# Patient Record
Sex: Male | Born: 1965
Health system: Southern US, Community
[De-identification: ages and names within clinical notes are randomized; demographics above are authoritative.]

## PROBLEM LIST (undated history)

## (undated) DIAGNOSIS — E039 Hypothyroidism, unspecified: Secondary | ICD-10-CM

## (undated) DIAGNOSIS — I712 Thoracic aortic aneurysm, without rupture, unspecified: Secondary | ICD-10-CM

## (undated) DIAGNOSIS — I2699 Other pulmonary embolism without acute cor pulmonale: Secondary | ICD-10-CM

## (undated) DIAGNOSIS — E785 Hyperlipidemia, unspecified: Secondary | ICD-10-CM

## (undated) DIAGNOSIS — I10 Essential (primary) hypertension: Secondary | ICD-10-CM

## (undated) DIAGNOSIS — I776 Arteritis, unspecified: Secondary | ICD-10-CM

## (undated) DIAGNOSIS — IMO0002 Reserved for concepts with insufficient information to code with codable children: Secondary | ICD-10-CM

## (undated) DIAGNOSIS — R12 Heartburn: Secondary | ICD-10-CM

## (undated) DIAGNOSIS — R1312 Dysphagia, oropharyngeal phase: Secondary | ICD-10-CM

## (undated) DIAGNOSIS — R1013 Epigastric pain: Secondary | ICD-10-CM

## (undated) DIAGNOSIS — D6859 Other primary thrombophilia: Secondary | ICD-10-CM

## (undated) HISTORY — DX: Essential (primary) hypertension: I10

## (undated) HISTORY — DX: Hyperlipidemia, unspecified: E78.5

## (undated) HISTORY — DX: Heartburn: R12

## (undated) HISTORY — DX: Epigastric pain: R10.13

## (undated) HISTORY — DX: Arteritis, unspecified: I77.6

## (undated) HISTORY — DX: Reserved for concepts with insufficient information to code with codable children: IMO0002

## (undated) HISTORY — DX: Thoracic aortic aneurysm, without rupture, unspecified: I71.20

## (undated) HISTORY — DX: Dysphagia, oropharyngeal phase: R13.12

## (undated) HISTORY — DX: Thoracic aortic aneurysm, without rupture: I71.2

## (undated) HISTORY — DX: Other pulmonary embolism without acute cor pulmonale: I26.99

## (undated) HISTORY — DX: Other primary thrombophilia: D68.59

## (undated) HISTORY — DX: Hypothyroidism, unspecified: E03.9

---

## 1999-05-29 ENCOUNTER — Emergency Department (HOSPITAL_COMMUNITY): Admission: EM | Admit: 1999-05-29 | Discharge: 1999-05-29 | Payer: Self-pay | Admitting: Internal Medicine

## 1999-06-09 ENCOUNTER — Emergency Department (HOSPITAL_COMMUNITY): Admission: EM | Admit: 1999-06-09 | Discharge: 1999-06-09 | Payer: Self-pay | Admitting: Emergency Medicine

## 2007-10-09 ENCOUNTER — Observation Stay (HOSPITAL_COMMUNITY): Admission: EM | Admit: 2007-10-09 | Discharge: 2007-10-11 | Payer: Self-pay | Admitting: Emergency Medicine

## 2008-12-21 HISTORY — PX: ELBOW SURGERY: SHX618

## 2009-12-22 ENCOUNTER — Emergency Department (HOSPITAL_COMMUNITY): Admission: EM | Admit: 2009-12-22 | Discharge: 2009-12-22 | Payer: Self-pay | Admitting: Emergency Medicine

## 2009-12-23 ENCOUNTER — Encounter (INDEPENDENT_AMBULATORY_CARE_PROVIDER_SITE_OTHER): Payer: Self-pay | Admitting: *Deleted

## 2009-12-23 ENCOUNTER — Ambulatory Visit (HOSPITAL_COMMUNITY): Admission: RE | Admit: 2009-12-23 | Discharge: 2009-12-23 | Payer: Self-pay | Admitting: Interventional Cardiology

## 2009-12-28 ENCOUNTER — Emergency Department (HOSPITAL_COMMUNITY): Admission: EM | Admit: 2009-12-28 | Discharge: 2009-12-28 | Payer: Self-pay | Admitting: Emergency Medicine

## 2009-12-28 ENCOUNTER — Encounter (INDEPENDENT_AMBULATORY_CARE_PROVIDER_SITE_OTHER): Payer: Self-pay | Admitting: *Deleted

## 2009-12-31 ENCOUNTER — Emergency Department (HOSPITAL_COMMUNITY): Admission: EM | Admit: 2009-12-31 | Discharge: 2009-12-31 | Payer: Self-pay | Admitting: Emergency Medicine

## 2009-12-31 ENCOUNTER — Ambulatory Visit: Payer: Self-pay | Admitting: Gastroenterology

## 2009-12-31 DIAGNOSIS — I10 Essential (primary) hypertension: Secondary | ICD-10-CM | POA: Insufficient documentation

## 2009-12-31 DIAGNOSIS — IMO0002 Reserved for concepts with insufficient information to code with codable children: Secondary | ICD-10-CM

## 2009-12-31 DIAGNOSIS — R1310 Dysphagia, unspecified: Secondary | ICD-10-CM | POA: Insufficient documentation

## 2010-01-02 ENCOUNTER — Telehealth: Payer: Self-pay | Admitting: Gastroenterology

## 2010-01-02 ENCOUNTER — Ambulatory Visit: Payer: Self-pay | Admitting: Gastroenterology

## 2010-01-02 ENCOUNTER — Ambulatory Visit (HOSPITAL_COMMUNITY): Admission: RE | Admit: 2010-01-02 | Discharge: 2010-01-02 | Payer: Self-pay | Admitting: Gastroenterology

## 2010-01-03 ENCOUNTER — Telehealth: Payer: Self-pay | Admitting: Gastroenterology

## 2010-01-04 ENCOUNTER — Emergency Department (HOSPITAL_COMMUNITY): Admission: EM | Admit: 2010-01-04 | Discharge: 2010-01-04 | Payer: Self-pay | Admitting: Emergency Medicine

## 2010-01-06 ENCOUNTER — Encounter: Payer: Self-pay | Admitting: Gastroenterology

## 2010-01-06 ENCOUNTER — Ambulatory Visit: Payer: Self-pay | Admitting: Internal Medicine

## 2010-01-06 ENCOUNTER — Telehealth: Payer: Self-pay | Admitting: Gastroenterology

## 2010-01-06 DIAGNOSIS — E039 Hypothyroidism, unspecified: Secondary | ICD-10-CM

## 2010-01-06 DIAGNOSIS — R1312 Dysphagia, oropharyngeal phase: Secondary | ICD-10-CM | POA: Insufficient documentation

## 2010-01-06 DIAGNOSIS — I10 Essential (primary) hypertension: Secondary | ICD-10-CM | POA: Insufficient documentation

## 2010-01-06 DIAGNOSIS — R634 Abnormal weight loss: Secondary | ICD-10-CM

## 2010-01-06 DIAGNOSIS — E785 Hyperlipidemia, unspecified: Secondary | ICD-10-CM | POA: Insufficient documentation

## 2010-01-07 ENCOUNTER — Encounter: Payer: Self-pay | Admitting: Gastroenterology

## 2010-01-07 ENCOUNTER — Ambulatory Visit (HOSPITAL_COMMUNITY): Admission: RE | Admit: 2010-01-07 | Discharge: 2010-01-07 | Payer: Self-pay | Admitting: Internal Medicine

## 2010-01-08 ENCOUNTER — Telehealth: Payer: Self-pay | Admitting: Gastroenterology

## 2010-01-08 ENCOUNTER — Emergency Department (HOSPITAL_COMMUNITY): Admission: EM | Admit: 2010-01-08 | Discharge: 2010-01-08 | Payer: Self-pay | Admitting: Emergency Medicine

## 2010-01-09 ENCOUNTER — Encounter: Payer: Self-pay | Admitting: Nurse Practitioner

## 2010-01-14 ENCOUNTER — Ambulatory Visit: Payer: Self-pay | Admitting: Internal Medicine

## 2010-01-14 ENCOUNTER — Encounter: Payer: Self-pay | Admitting: Gastroenterology

## 2010-01-14 ENCOUNTER — Telehealth: Payer: Self-pay | Admitting: Gastroenterology

## 2010-01-14 DIAGNOSIS — R1013 Epigastric pain: Secondary | ICD-10-CM

## 2010-01-14 DIAGNOSIS — I712 Thoracic aortic aneurysm, without rupture, unspecified: Secondary | ICD-10-CM | POA: Insufficient documentation

## 2010-01-14 DIAGNOSIS — R1319 Other dysphagia: Secondary | ICD-10-CM

## 2010-01-14 DIAGNOSIS — R12 Heartburn: Secondary | ICD-10-CM

## 2010-01-15 ENCOUNTER — Telehealth: Payer: Self-pay | Admitting: Internal Medicine

## 2010-01-15 ENCOUNTER — Telehealth (INDEPENDENT_AMBULATORY_CARE_PROVIDER_SITE_OTHER): Payer: Self-pay | Admitting: *Deleted

## 2010-01-15 DIAGNOSIS — R930 Abnormal findings on diagnostic imaging of skull and head, not elsewhere classified: Secondary | ICD-10-CM | POA: Insufficient documentation

## 2010-01-16 ENCOUNTER — Telehealth: Payer: Self-pay | Admitting: Gastroenterology

## 2010-01-16 ENCOUNTER — Ambulatory Visit: Payer: Self-pay | Admitting: Internal Medicine

## 2010-01-16 ENCOUNTER — Inpatient Hospital Stay (HOSPITAL_COMMUNITY): Admission: AD | Admit: 2010-01-16 | Discharge: 2010-01-20 | Payer: Self-pay | Admitting: Internal Medicine

## 2010-01-20 ENCOUNTER — Encounter (INDEPENDENT_AMBULATORY_CARE_PROVIDER_SITE_OTHER): Payer: Self-pay | Admitting: *Deleted

## 2010-01-20 ENCOUNTER — Encounter (INDEPENDENT_AMBULATORY_CARE_PROVIDER_SITE_OTHER): Payer: Self-pay | Admitting: Internal Medicine

## 2010-01-20 ENCOUNTER — Ambulatory Visit: Payer: Self-pay | Admitting: Vascular Surgery

## 2010-01-29 ENCOUNTER — Ambulatory Visit: Payer: Self-pay | Admitting: Gastroenterology

## 2010-01-30 DIAGNOSIS — I776 Arteritis, unspecified: Secondary | ICD-10-CM

## 2010-02-03 ENCOUNTER — Encounter: Payer: Self-pay | Admitting: Gastroenterology

## 2010-02-07 ENCOUNTER — Encounter: Payer: Self-pay | Admitting: Gastroenterology

## 2010-02-26 ENCOUNTER — Emergency Department (HOSPITAL_COMMUNITY): Admission: EM | Admit: 2010-02-26 | Discharge: 2010-02-26 | Payer: Self-pay | Admitting: Emergency Medicine

## 2010-02-28 ENCOUNTER — Emergency Department (HOSPITAL_COMMUNITY): Admission: EM | Admit: 2010-02-28 | Discharge: 2010-02-28 | Payer: Self-pay | Admitting: Emergency Medicine

## 2010-03-04 ENCOUNTER — Inpatient Hospital Stay (HOSPITAL_COMMUNITY): Admission: EM | Admit: 2010-03-04 | Discharge: 2010-03-07 | Payer: Self-pay | Admitting: Emergency Medicine

## 2010-03-05 ENCOUNTER — Ambulatory Visit: Payer: Self-pay | Admitting: Vascular Surgery

## 2010-03-05 ENCOUNTER — Encounter (INDEPENDENT_AMBULATORY_CARE_PROVIDER_SITE_OTHER): Payer: Self-pay | Admitting: Internal Medicine

## 2010-03-06 ENCOUNTER — Ambulatory Visit: Payer: Self-pay | Admitting: Oncology

## 2010-03-17 ENCOUNTER — Encounter: Admission: RE | Admit: 2010-03-17 | Discharge: 2010-03-17 | Payer: Self-pay | Admitting: Family Medicine

## 2010-04-02 IMAGING — CT CT HEAD W/O CM
1 of 2 series · 16 of 30 positions shown, 20 images · non-contrast
Comparison: None.

CLINICAL DATA: Headache since lumbar puncture several days ago.

CT HEAD WITHOUT CONTRAST
TECHNIQUE: Contiguous axial images were obtained from the base of
the skull through the vertex without contrast.

[Series 2: brain · axial · 0.47mm/px · z∈[+92,+249]mm · 16 of 32 slices shown, 20 images]
[im 2/32  brain]
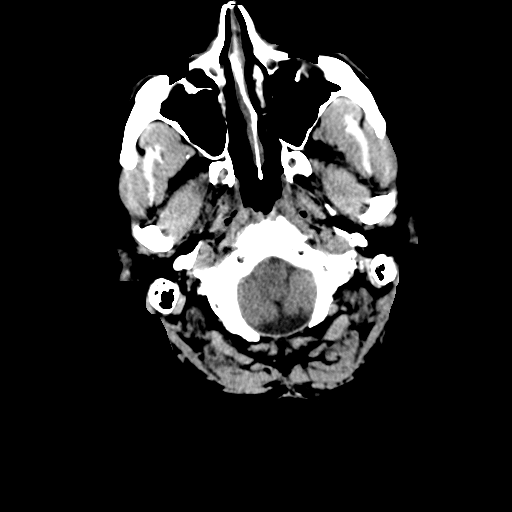
[im 2/32  bone]
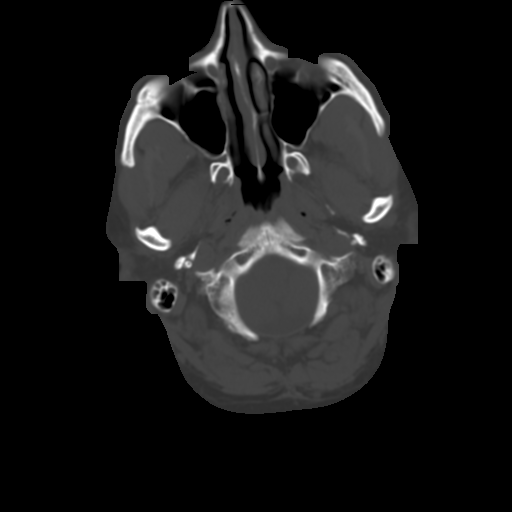
[im 4/32  brain]
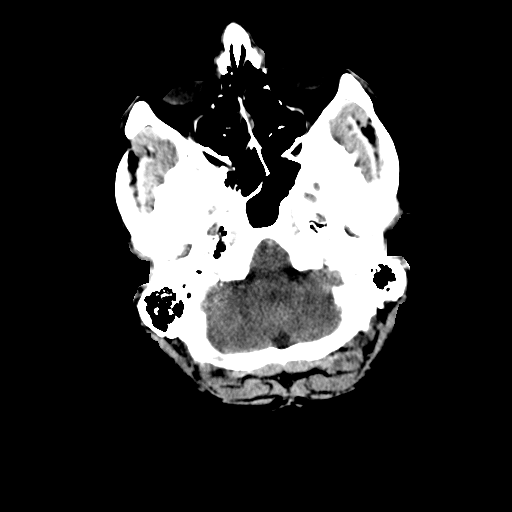
[im 5/32  brain]
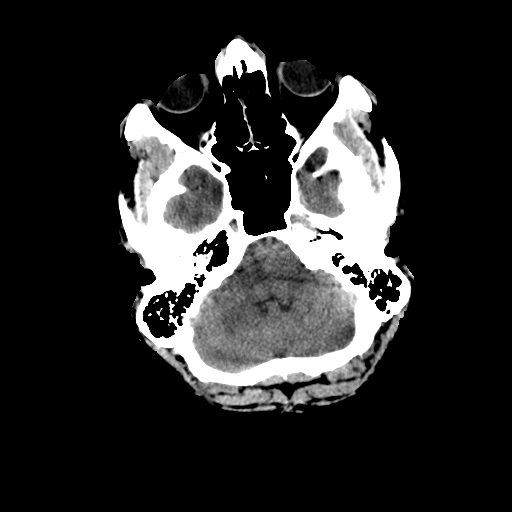
[im 7/32  brain]
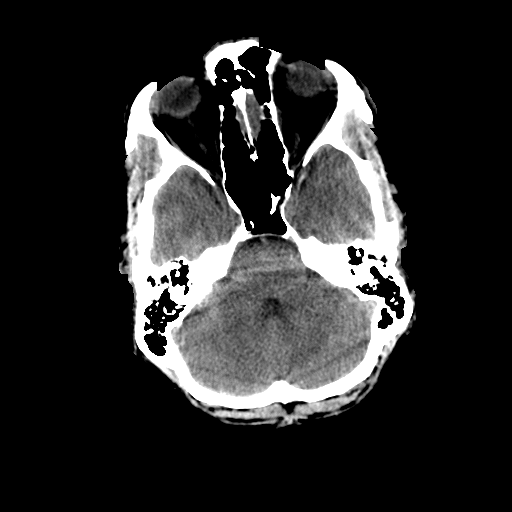
[im 10/32  brain]
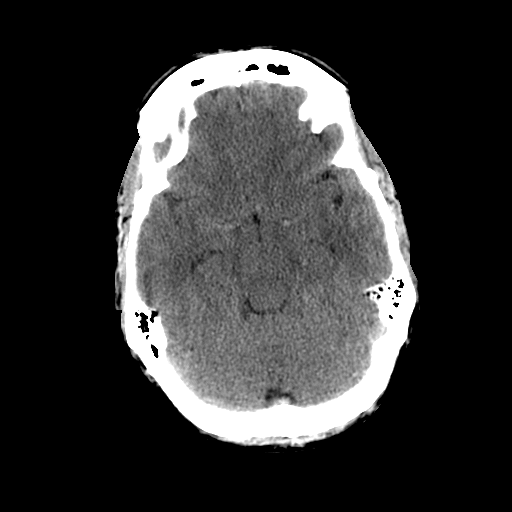
[im 10/32  bone]
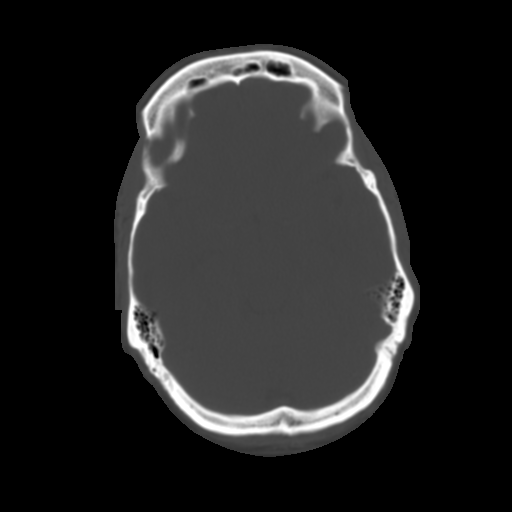
[im 12/32  brain]
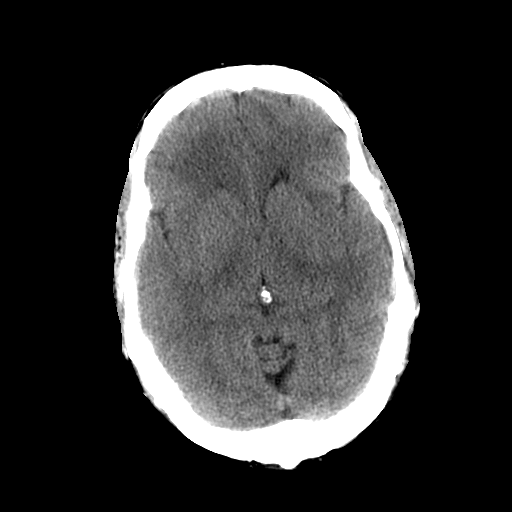
[im 14/32  brain]
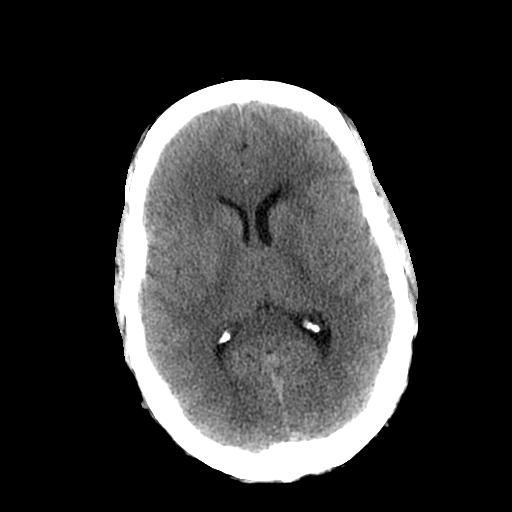
[im 15/32  brain]
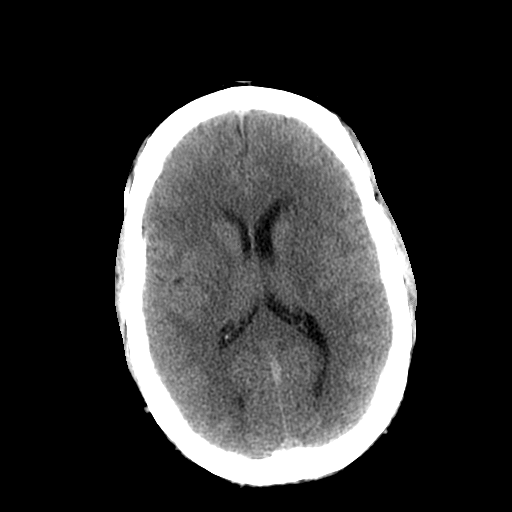
[im 17/32  brain]
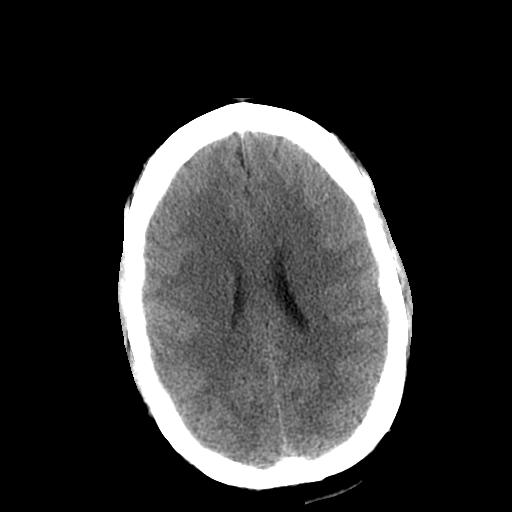
[im 17/32  bone]
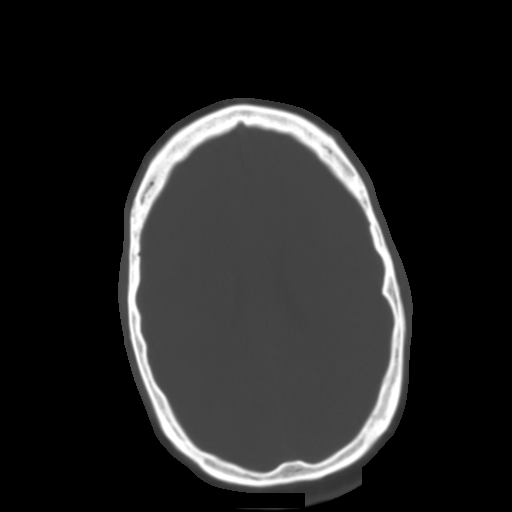
[im 18/32  brain]
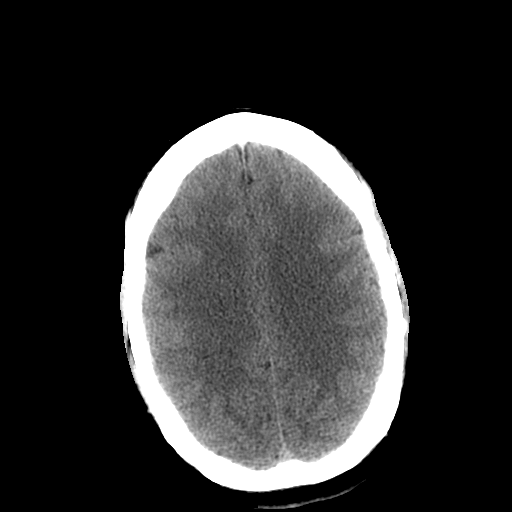
[im 20/32  brain]
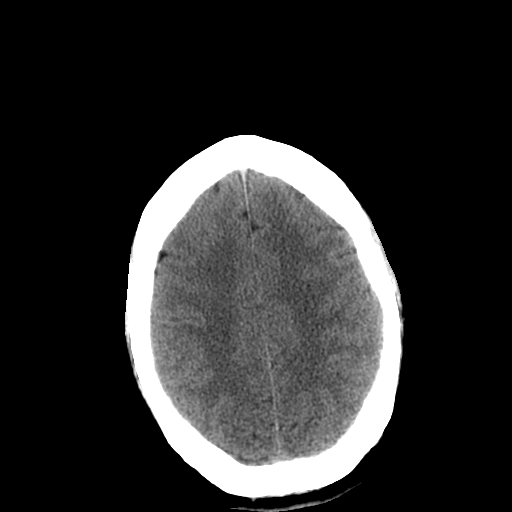
[im 22/32  brain]
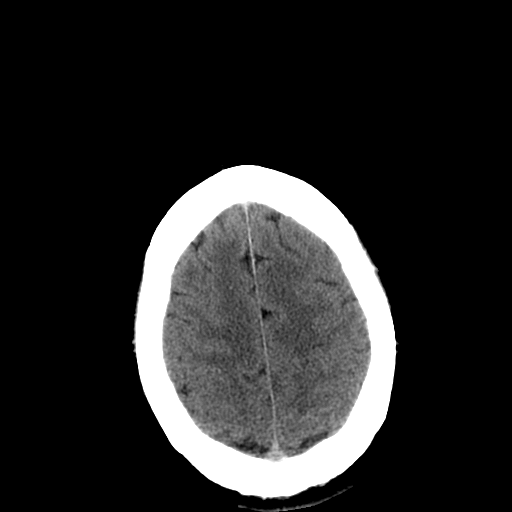
[im 25/32  brain]
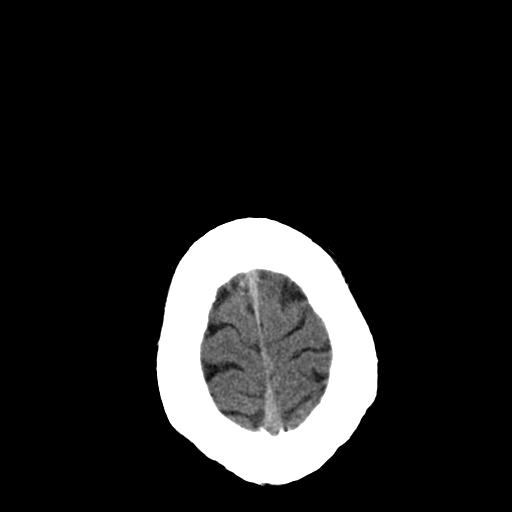
[im 25/32  bone]
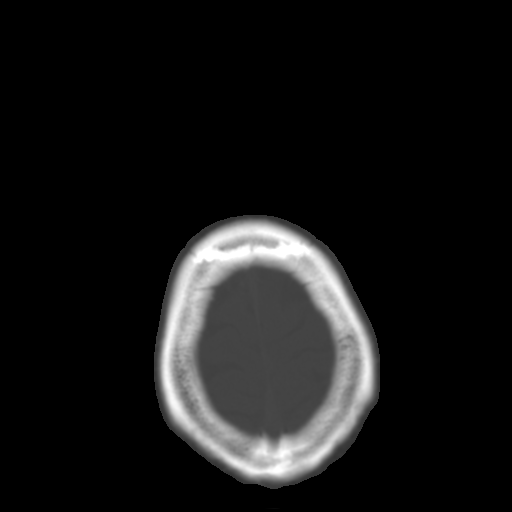
[im 27/32  brain]
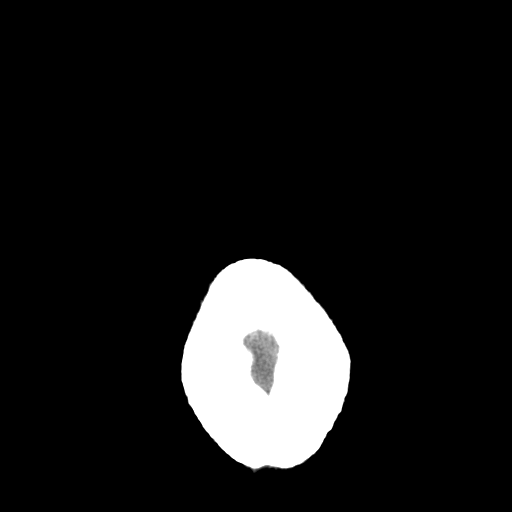
[im 28/32  brain]
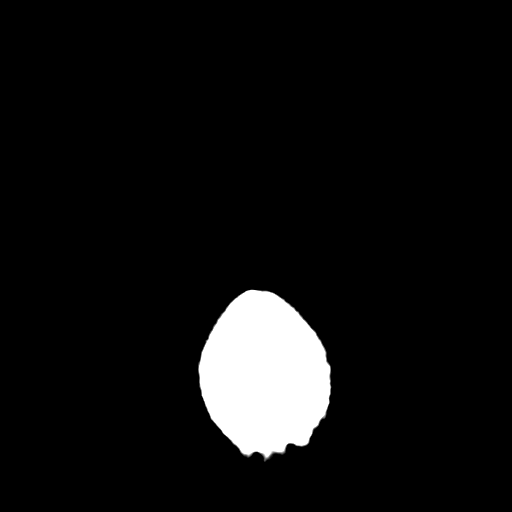
[im 30/32  brain]
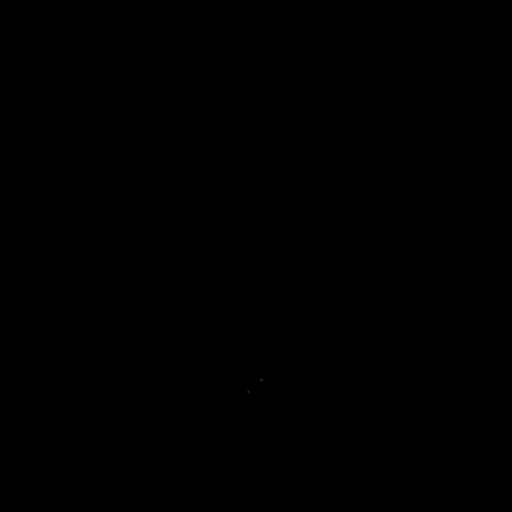

[16 of 30 positions shown; findings below may reference images not displayed]

FINDINGS: Ventricular size and CSF spaces normal.  No evidence for
acute infarct, hemorrhage, or mass lesion. No extra-axial fluid
collections or midline shift.  Calvarium intact.  No fluid in the
sinuses visualized.
IMPRESSION: No acute or focal abnormality.

## 2010-04-02 IMAGING — CT CT ANGIO CHEST
1 of 7 series · 12 of 36 positions shown · IV contrast (agent unspecified)
Comparison: Chest CT 01/16/2010

CLINICAL DATA: Chest pain.

CT ANGIOGRAPHY CHEST WITH CONTRAST
TECHNIQUE: Multidetector CT imaging of the chest was performed
using the standard protocol during bolus administration of
intravenous contrast.  Multiplanar CT image reconstructions
including MIPs were obtained to evaluate the vascular anatomy.
Contrast:  100 ml of Gmnipaque-XGG

[Series 3: pe · axial · 0.61mm/px · z∈[-294,-22]mm · 12 of 257 slices shown]
[im 20/257  lung]
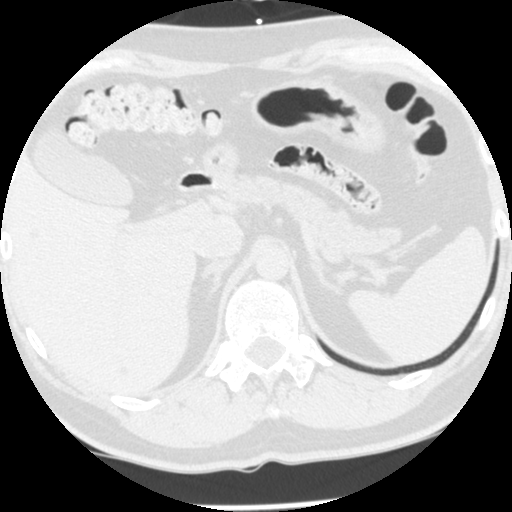
[im 40/257  mediastinal]
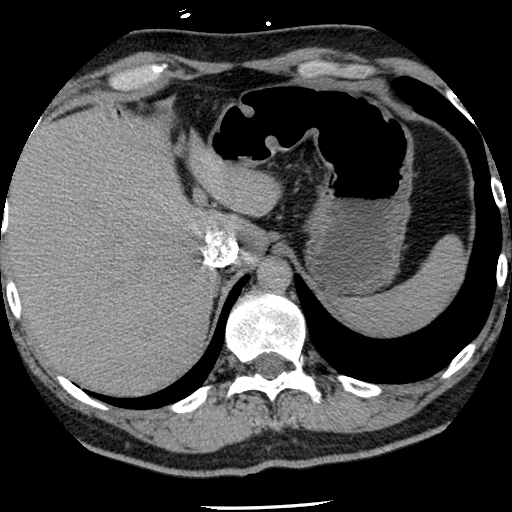
[im 60/257  lung]
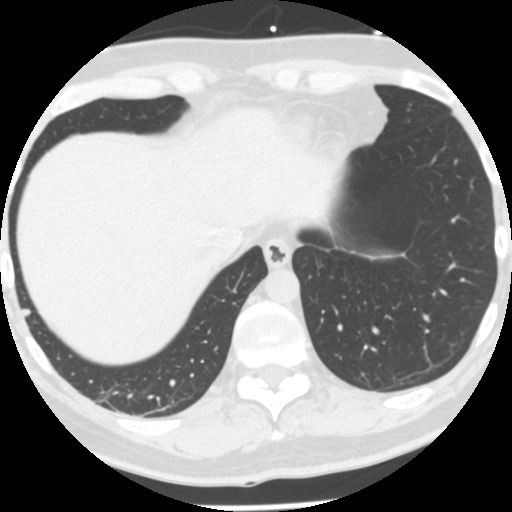
[im 79/257  mediastinal]
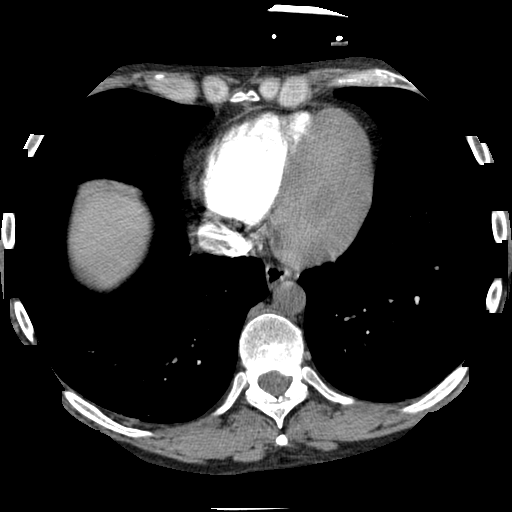
[im 99/257  lung]
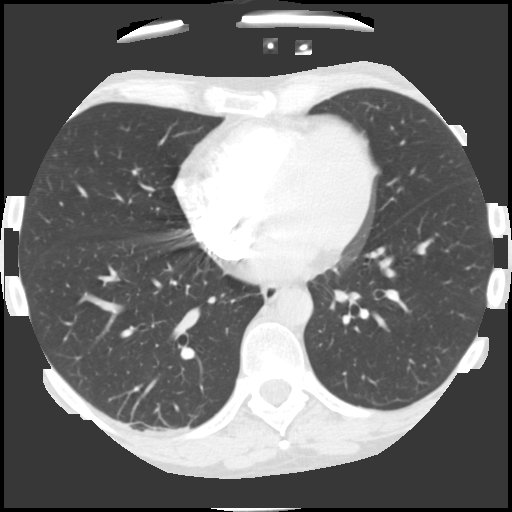
[im 119/257  mediastinal]
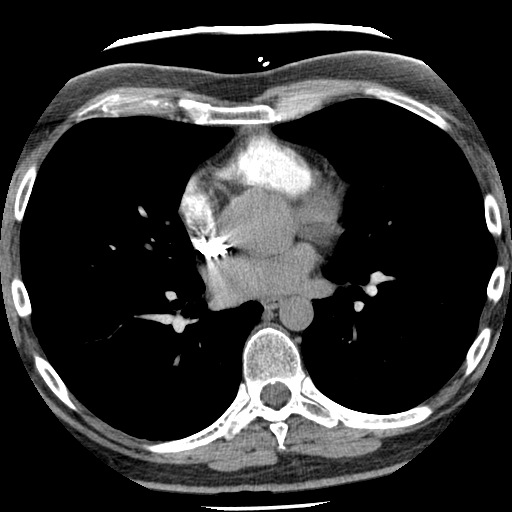
[im 138/257  lung]
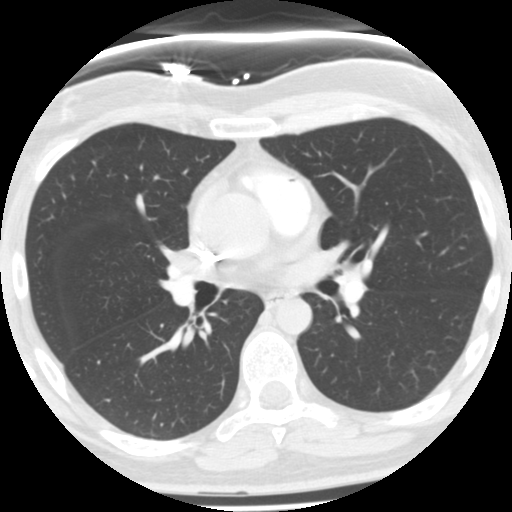
[im 158/257  mediastinal]
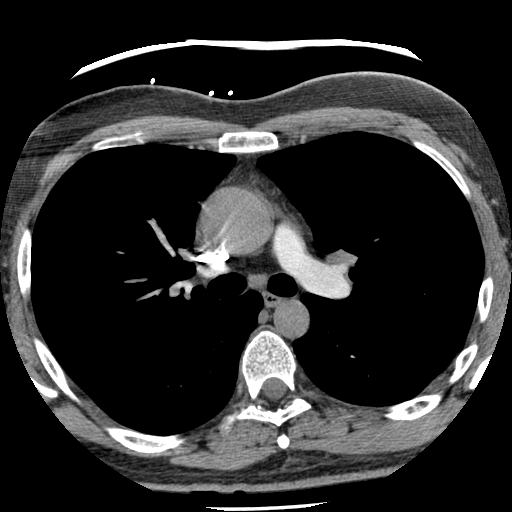
[im 178/257  lung]
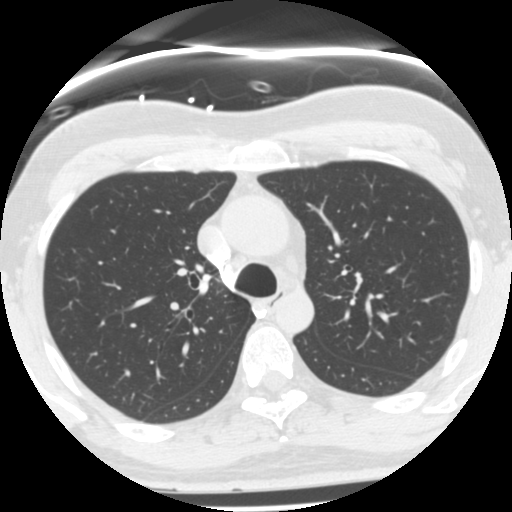
[im 197/257  mediastinal]
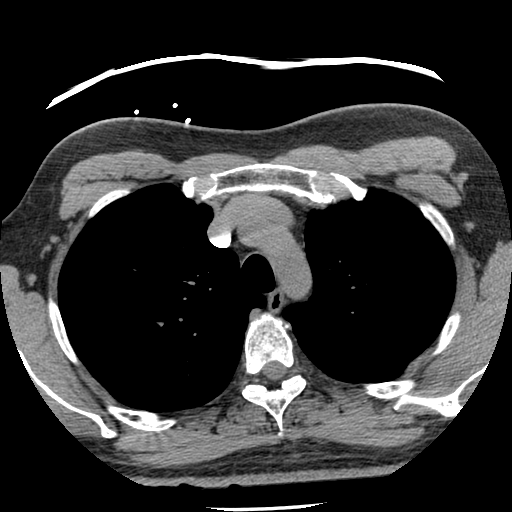
[im 217/257  lung]
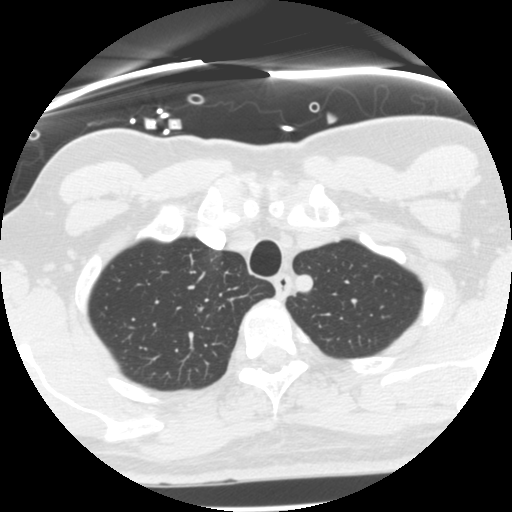
[im 237/257  mediastinal]
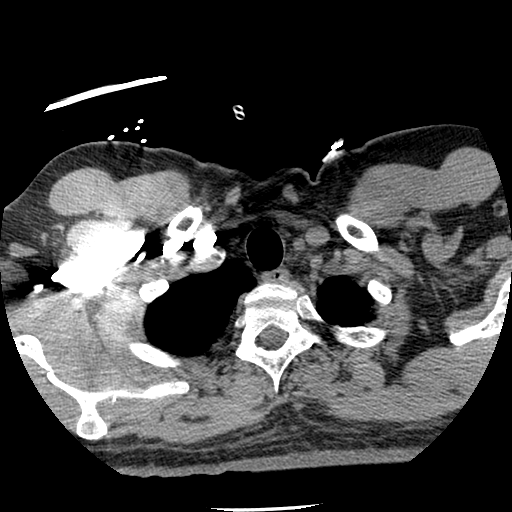

[12 of 36 positions shown; findings below may reference images not displayed]

FINDINGS: The chest wall is unremarkable and stable.  The bony
thorax is intact.

The heart is normal in size.  No pericardial effusion.  No
mediastinal or hilar adenopathy.  The esophagus is grossly normal.
Stable aneurysmal dilatation of the ascending aorta with maximal
measurements of 4.3 x 4.2 cm at the level of the right main
pulmonary artery.  The descending thoracic aorta is normal in
caliber.

The pulmonary arterial tree is fairly well opacified.  No definite
filling defects to suggest pulmonary emboli.

Examination of the lung parenchyma demonstrates no acute pulmonary
findings.  No pulmonary nodules or mass lesions.  No pleural
effusions.

The visualized upper abdomen is unremarkable.  Small hepatic cysts
are noted.

Review of the MIP images confirms the above findings.
IMPRESSION: 1.  No CT findings for pulmonary embolism.  The embolism noted on
the prior chest CT has resolved.
2.  Stable aneurysmal dilatation of the ascending aorta.
3.  No acute pulmonary findings.

## 2010-04-03 ENCOUNTER — Telehealth: Payer: Self-pay | Admitting: Gastroenterology

## 2010-04-07 ENCOUNTER — Ambulatory Visit: Payer: Self-pay | Admitting: Gastroenterology

## 2010-04-07 DIAGNOSIS — D68318 Other hemorrhagic disorder due to intrinsic circulating anticoagulants, antibodies, or inhibitors: Secondary | ICD-10-CM

## 2010-04-07 DIAGNOSIS — R079 Chest pain, unspecified: Secondary | ICD-10-CM | POA: Insufficient documentation

## 2010-04-07 DIAGNOSIS — D6859 Other primary thrombophilia: Secondary | ICD-10-CM

## 2010-04-08 ENCOUNTER — Ambulatory Visit: Payer: Self-pay | Admitting: Gastroenterology

## 2010-04-09 ENCOUNTER — Encounter (INDEPENDENT_AMBULATORY_CARE_PROVIDER_SITE_OTHER): Payer: Self-pay | Admitting: *Deleted

## 2010-04-09 ENCOUNTER — Telehealth: Payer: Self-pay | Admitting: Gastroenterology

## 2010-04-09 ENCOUNTER — Telehealth (INDEPENDENT_AMBULATORY_CARE_PROVIDER_SITE_OTHER): Payer: Self-pay | Admitting: *Deleted

## 2010-04-10 ENCOUNTER — Inpatient Hospital Stay (HOSPITAL_COMMUNITY): Admission: EM | Admit: 2010-04-10 | Discharge: 2010-04-11 | Payer: Self-pay | Admitting: Emergency Medicine

## 2010-04-10 ENCOUNTER — Ambulatory Visit (HOSPITAL_COMMUNITY): Admission: RE | Admit: 2010-04-10 | Discharge: 2010-04-10 | Payer: Self-pay | Admitting: Gastroenterology

## 2010-04-23 ENCOUNTER — Ambulatory Visit: Payer: Self-pay | Admitting: Gastroenterology

## 2010-04-29 ENCOUNTER — Telehealth: Payer: Self-pay | Admitting: Gastroenterology

## 2010-05-01 ENCOUNTER — Telehealth: Payer: Self-pay | Admitting: Gastroenterology

## 2010-05-02 ENCOUNTER — Ambulatory Visit: Payer: Self-pay | Admitting: Oncology

## 2010-05-02 ENCOUNTER — Telehealth: Payer: Self-pay | Admitting: Gastroenterology

## 2010-05-12 ENCOUNTER — Telehealth: Payer: Self-pay | Admitting: Gastroenterology

## 2010-05-22 ENCOUNTER — Encounter: Payer: Self-pay | Admitting: Gastroenterology

## 2010-05-27 ENCOUNTER — Ambulatory Visit (HOSPITAL_COMMUNITY): Admission: RE | Admit: 2010-05-27 | Discharge: 2010-05-27 | Payer: Self-pay | Admitting: Gastroenterology

## 2010-06-04 ENCOUNTER — Ambulatory Visit: Payer: Self-pay | Admitting: Gastroenterology

## 2010-06-16 DIAGNOSIS — I2699 Other pulmonary embolism without acute cor pulmonale: Secondary | ICD-10-CM

## 2010-08-29 ENCOUNTER — Ambulatory Visit: Payer: Self-pay | Admitting: Oncology

## 2010-09-03 ENCOUNTER — Encounter: Payer: Self-pay | Admitting: Family Medicine

## 2010-11-27 ENCOUNTER — Emergency Department (HOSPITAL_COMMUNITY): Admission: EM | Admit: 2010-11-27 | Discharge: 2010-10-07 | Payer: Self-pay | Admitting: Emergency Medicine

## 2011-01-11 ENCOUNTER — Encounter: Payer: Self-pay | Admitting: Neurology

## 2011-01-20 NOTE — Progress Notes (Signed)
Summary: triage  Phone Note Call from Patient Call back at [phone]   Caller: Dad-- Iantha Fallen Call For: Arlyce Dice Reason for Call: Talk to Nurse Summary of Call: Patient wants to speak to nurse states that his son is on his way to the ER because Dr Jenne Pane from ENT told him that it wasn't and ENT problem Initial call taken by: Tawni Levy,  January 08, 2010 1:46 PM  Follow-up for Phone Call        No answer/no voicemail. I will try again tomorrow. Follow-up by: Laureen Ochs LPN,  January 08, 2010 4:29 PM  Additional Follow-up for Phone Call Additional follow up Details #1::        Pt. went to the ER yesterday, no pill stuck, probably esophagitis. Pt. will keep appt. with his ENT today and also his upcomming Neurologist. Pt. instructed to call back as needed.  Additional Follow-up by: Laureen Ochs LPN,  January 09, 2010 8:38 AM

## 2011-01-20 NOTE — Progress Notes (Signed)
Summary: Triage  Phone Note Call from Patient Call back at Home Phone [phone]   Caller: Patient Call For: Dr. Arlyce Dice Reason for Call: Talk to Nurse Summary of Call: Pt doesnt feel he can wait until next available... chest pain and stomach pain from reflux. Initial call taken by: Vallarie Mare,  April 03, 2010 3:50 PM  Follow-up for Phone Call        Message left for patient to callback. Laureen Ochs LPN  April 03, 2010 3:57 PM   Pt. c/o stomache and chest burning. Takes Carafate QID and Zegerid two times a day. Wants an appt. ASAP. He will see Dr.Nohea Kras on 04-07-10 at 2:30pm. Pt. instructed to call back as needed.  Follow-up by: Laureen Ochs LPN,  April 04, 2010 9:20 AM

## 2011-01-20 NOTE — Progress Notes (Signed)
Summary: Record request and Disability Form  Request for records received from [facility]. Request forwarded to [employer]. Wilder Glade  January 15, 2010 12:38 PM

## 2011-01-20 NOTE — Assessment & Plan Note (Signed)
Summary: F/U FROM ENDO. AND BA ESOPHAGRAM             [name]   History of Present Illness Visit Type: Follow-up Visit Primary GI MD: Melvia Heaps MD [facility] Primary Provider: Duane Lope, MD  Requesting Provider: n/a Chief Complaint: F/u from ENDO, and BA Esophagram. Pt states that he is still having acid reflux and heartburn History of Present Illness:   [name] has returned for followup of his dysphagia and reflux.  Upper endoscopy was normal except for a suggestion of a dilated esophagus.  A barium swallow did not confirm this.  On DEXILANT he is feeling improved, however, he does have some breakthrough pyrosis.  He is taking DEXILANT twice daily in addition to Carafate.  Since his last visit he had a TIA.  Dysphagia has improved.   GI Review of Systems    Reports acid reflux and  heartburn.      Denies abdominal pain, belching, bloating, chest pain, dysphagia with liquids, dysphagia with solids, loss of appetite, nausea, vomiting, vomiting blood, weight loss, and  weight gain.        Denies anal fissure, black tarry stools, change in bowel habit, constipation, diarrhea, diverticulosis, fecal incontinence, heme positive stool, hemorrhoids, irritable bowel syndrome, jaundice, light color stool, liver problems, rectal bleeding, and  rectal pain.    Current Medications (verified): 1)  Aspir-Low 81 Mg Tbec (Aspirin) .... One Tablet By Mouth Once Daily 2)  Metoprolol Tartrate 1 Mg/ml Soln (Metoprolol Tartrate) .... Take 2.5 Ml By Mouth Once Daily 3)  Levothyroxine Sodium 112 Mcg Tabs (Levothyroxine Sodium) .Marland Kitchen.. 1 and 1/2 Pill Two Days A Week 4)  Lisinopril-Hydrochlorothiazide 20-12.5 Mg Tabs (Lisinopril-Hydrochlorothiazide) .... One Tablet By Mouth Once Daily 5)  Carafate 1 Gm/61ml Susp (Sucralfate) .... Take 1 Gram 4 Times Daily 6)  Coumadin 7.5 Mg Tabs (Warfarin Sodium) .... Take One Tab By Mouth As Directed 7)  Ativan 1 Mg Tabs (Lorazepam) .... One To Three Times A Day As Needed 8)   Tylenol Extra Strength 500 Mg Tabs (Acetaminophen) .... As Needed 9)  Zoloft 50 Mg Tabs (Sertraline Hcl) .... Take 1/2 Tab By Mouth Once Daily 10)  Dexilant 60 Mg Cpdr (Dexlansoprazole) .Marland Kitchen.. 1 Tab 1-2 Times  Daily 11)  Potassium Chloride 30 Meq/124ml Soln (Potassium Chloride) .... Once Daily  Allergies (verified): No Known Drug Allergies  Past History:  Past Medical History: Reviewed history from 01/29/2010 and no changes required. Hypertension Thyroid Disease  GERD Ascending Thoracic Aneurysm, 4.1 cm, on CTA chest 2008 Pulmonary Embolism  Past Surgical History: Reviewed history from 12/31/2009 and no changes required. Left Elbow Surgery   Family History: Reviewed history from 12/31/2009 and no changes required. No FH of Colon Cancer: Family History of Clotting disorder: Mother  Family History of Heart Disease: Father   Social History: Reviewed history from 12/31/2009 and no changes required. 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  Alcohol Use - no Daily Caffeine Use: 2 daily  Illicit Drug Use - no  Review of Systems  The patient denies allergy/sinus, anemia, anxiety-new, arthritis/joint pain, back pain, blood in urine, breast changes/lumps, change in vision, confusion, cough, coughing up blood, depression-new, fainting, fatigue, fever, headaches-new, hearing problems, heart murmur, heart rhythm changes, itching, menstrual pain, muscle pains/cramps, night sweats, nosebleeds, pregnancy symptoms, shortness of breath, skin rash, sleeping problems, sore throat, swelling of feet/legs, swollen lymph glands, thirst - excessive , urination - excessive , urination changes/pain, urine leakage, vision changes, and voice change.  Vital Signs:  Patient profile:   45 year old male Height:      75 inches Weight:      203 pounds BMI:     25.46 BSA:     2.21 Pulse rate:   88 / minute Pulse rhythm:   regular BP sitting:   112 / 76  (left arm) Cuff size:    regular  Vitals Entered By: Ok Anis CMA (Apr 23, 2010 9:21 AM)   Impression & Recommendations:  Problem # 1:  HEARTBURN (ICD-787.1) He seems to be doing fairly well on a DEXILANT.  I instructed him to use antacids p.r.n. for breakthrough pyrosis.  If this  is a frequent occurrence would consider switching to a different PPI.  He will discontinue  his Carafate.  Problem # 2:  DYSPHAGIA (ICD-787.29) Assessment: Improved  Problem # 3:  COAGULOPATHY, COUMADIN-INDUCED (ICD-286.5) Assessment: Comment Only  Patient Instructions: 1)  Copy sent to : Hessie Diener Ross,MD 2)  You will need to schedule a follow up appointment in 6 weeks 3)  The medication list was reviewed and reconciled.  All changed / newly prescribed medications were explained.  A complete medication list was provided to the patient / caregiver. 4)  Please schedule a follow-up appointment in 6 to 8 weeks.

## 2011-01-20 NOTE — Discharge Summary (Signed)
NAMEQUINTERIUS, [name]             ACCOUNT NO.:  [id number]      MEDICAL RECORD NO.:  [id number]          PATIENT TYPE:  INP      LOCATION:  1417                         FACILITY:  [facility]      PHYSICIAN:  Hillery Aldo, M.D.   DATE OF BIRTH:  Aug 14, 1966      DATE OF ADMISSION:  01/16/2010   DATE OF DISCHARGE:  01/20/2010                                  DISCHARGE SUMMARY      PRIMARY CARE PHYSICIAN:  Dr. Crawford Givens      NEUROLOGISTS:  Dr. Lewayne Bunting and Dr. Levert Feinstein.      DISCHARGE DIAGNOSES:   1. Incidentally discovered pulmonary embolism.   2. Chronic dysphagia.   3. Abnormal MRI showing foci of abnormal signal within the hemispheric       white matter which could represent small vessel disease, a       demyelinating disease, or multiple sclerosis.   4. Hypokalemia.   5. Ascending thoracic aortic aneurysm measuring 41 x 42 mm.   6. Hypertension.   7. Hypothyroidism.   8. Gastroesophageal reflux disease.   9. Anxiety.   10.Insomnia.   11.Malnutrition secondary to decreased p.o. intake from dysphagia.      DISCHARGE MEDICATIONS:   1. Enoxaparin 100 mg subcutaneously every 12 hours until told to stop       by PCP.   2. Ensure supplements b.i.d. and as needed.   3. Lorazepam 1 mg p.o. every 6 hours p.r.n. anxiety, sleep, or       dyspnea.   4. Metoprolol 25 mg p.o. daily.   5. Warfarin 12.5 mg p.o. daily.   6. Aspirin 81 mg p.o. daily.   7. Carafate 1 g/10 mL 2 teaspoons p.o. q.i.d.   8. Lisinopril/hydrochlorothiazide 20/12.5 mg p.o. daily.   9. Pepto-Bismol 1 teaspoon by mouth every 4 hours p.r.n.   10.Synthroid 112 mcg daily except for Sundays and Wednesdays when the       dose is increased to 168 mcg.   11.Zegerid 40 mg 1 packet p.o. daily.      CONSULTATIONS:   1. Dr. Levert Feinstein of Neurology.   2. Dr. Russella Dar of Gastroenterology.      BRIEF ADMISSION HPI:  Patient is a 45 year old male whose history of   present illness dates back to December when he began  to experience   problems with dysphagia.  Patient's dysphagia progressed to the point   where he had a choking episode while in a restaurant.  Subsequently   underwent an outpatient evaluation including an upper endoscopy where he   was found to have a mild stricture which was dilated.  GI   recommendations were to pursue a neurological workup.  The patient   subsequently saw Dr. Terrace Arabia as an outpatient where an extensive evaluation   was undertaken.  He underwent an MRI scan which showed white matter   changes.  Dr. Terrace Arabia felt that the changes were likely due to his   hypertension because the area of distribution was not consistent with   multiple  sclerosis.  It was not felt that his abnormal MRI scan was   related to his dysphagia.  The patient underwent visual evoked response   testing by Dr. Terrace Arabia which was apparently within normal limits.  The   patient has had a routine followup CT scan of his chest to follow up on   his ascending aortic aneurysm to see if this was the source of dysphagia   and subsequently was discovered to have an incidentally noted pulmonary   embolus.  He then was referred to the hospitalist service for initiation   of anticoagulation treatment.  For the full details, please see the   dictated report done by Dr. Jeoffrey Massed.      PROCEDURES AND DIAGNOSTIC STUDIES:   1. CT angiogram of the chest on January 16, 2010, showed no       significant change in the dilatation of the ascending aorta.       Pulmonary thromboembolism within the posterior segment of the right       lower lobe.  Minimal spot burden.   2. Two-dimensional echocardiogram done on January 20, 2010, showed       mild focal basal hypertrophy of the septum.  Systolic function was       normal.  The estimated ejection fraction was 55% to 60%.  No       diagnostic regional wall motion abnormality identified.      DISCHARGE LABORATORY VALUES:  ANA was negative.  Sodium was 142,   potassium 4, chloride  106, bicarbonate 33, BUN 9, creatinine 0.88,   glucose 97, calcium 9.1.  PT was 16.6 with an INR of 1.35.  BNP was less   than 30.  C-reactive protein was 0.  Rheumatoid factor was less than 20.   ESR was 5.  A hypercoagulable panel was negative.  Factor V Leiden is   still pending.  Hemoglobin A1c was 5.3%.  RPR was nonreactive.   Scleroderma and centromere antibodies are pending.      HOSPITAL COURSE BY PROBLEM:   1. Acute pulmonary embolism:  This pulmonary embolism was incidentally       discovered upon evaluation of his known thoracic ascending thoracic       aneurysm.  Patient was put on Lovenox and Coumadin.  His INR is not       yet therapeutic and he will need at least 5 days of overlap with an       additional 48 hours of overlap once his INR is therapeutic.       Patient has been instructed on how to self-administer Lovenox       injections.  His mother has factor V Leiden and she is familiar       with the process of anticoagulation.  Patient's factor V Leiden       studies are pending but his other hypercoagulability profile workup       was negative.   2. Dysphagia:  This has been chronic since mid December.  Patient has       undergone both extensive GI evaluation and also neurologic       evaluation with no specific etiology found.  Of note, with his left-       sided facial droop, abnormal MRI scans, and relatively mild       stricture, status post dilatation, it would seem that this likely       represents a neurologic problem.  Patient has only been  able to       take in liquids since December and has lost a fair amount of       weight.  It is imperative that the origin of his dysphagia is       discovered.  He was seen by the speech therapist here.  There are       no signs of aspiration.  A modified barium swallow was done back on       January 07, 2010.  At that time, he appeared to have difficulty       passing barium through the upper esophagus with questionable         dysmotility and/or discoordination of upper esophageal sphincter       opening.  A 13-mm barium tablet lodged in the piriform sinus and       required multiple boluses to eventually clear.  Appearance of       variable amounts of residual throughout esophagus with suspected       recurrent sensation to pharynx.  It was felt that the patient       likely had dysmotility with mild backflow.  At this point, the       patient continues to only be able to tolerate liquids.  He is       requesting workup at a tertiary care center.  The patient has been       referred to Dr. Lewayne Bunting at [facility] for ongoing evaluation.   3. Abnormal MRI:  Unclear etiology.  The patient is under the care of       Dr. Terrace Arabia and has been referred to Dr. Lilian Kapur for further       evaluation.   4. Hypokalemia:  Patient's potassium was fully repleted.   5. Ascending aneurysm:  Stable on chest CT.  A 2D echo also stable.   6. Hypertension:  Patient's blood pressure is well controlled on his       home medications.   7. Hypothyroidism:  Patient has been continued on his usual dose of       Synthroid.   8. Gastroesophageal reflux disease:  Patient was maintained on a       proton pump inhibitor, as well as Carafate.   9. Anxiety/insomnia:  Patient was given Ativan with good relief and       improvement in his insomnia and dyspnea symptoms.      DISPOSITION:  Patient is medically stable for discharge.  He will be   discharged home.  He has a followup PT/INR check at his primary care   physician's office on January 21, 2010, at 2:45 p.m.  He has a followup   appointment with Dr. Lewayne Bunting of Neurology at [facility].      Time spent coordinating care for discharge and in discharge instructions   equals 45 minutes.               Hillery Aldo, M.D.            CR/MEDQ  D:  01/20/2010  T:  01/20/2010  Job:  [id number]      cc:   Dwana Curd. Para March, M.D.   Fax: [phone]      Lewayne Bunting, M.D.   Fax:  [phone]      Levert Feinstein, MD         Electronically Signed by Hillery Aldo M.D. on 01/21/2010 11:01:22 AM

## 2011-01-20 NOTE — Letter (Signed)
Summary: Generic Letter  [facility]  [address] [place], [place] [address]   Phone: [phone]  Fax: [phone]    01/06/2010  [name] [address] [place], [place]  [address]  Dear Mr. Hanken,   Your biopsies demonstrated inflammatory changes only.    Please follow the recommendations previously discussed.  Should you have any immediate concerns or questions, feel free to contact me at the office.    Sincerely,  Barbette Hair. Arlyce Dice, M.D., [facility]            Sincerely,   Melvia Heaps MD  Appended Document: Generic Letter Letter mailed to patient.

## 2011-01-20 NOTE — Letter (Signed)
Summary: [facility]  [facility]   Imported By: Sherian Rein 01/20/2010 10:12:39  _____________________________________________________________________  External Attachment:    Type:   Image     Comment:   External Document

## 2011-01-20 NOTE — Procedures (Signed)
Summary: Upper Endoscopy w/DIL  Patient: [name] Note: All result statuses are Final unless otherwise noted.  Tests: (1) Upper Endoscopy w/DIL (UED)  UED Upper Endoscopy w/DIL                             DONE     [facility]     [address] [place], [place]  [address]           ENDOSCOPY PROCEDURE REPORT           PATIENT:  [name], [name]  MR#:  [id number]     BIRTHDATE:  March 24, 1966, 43 yrs. old  GENDER:  male           ENDOSCOPIST:  Barbette Hair. Arlyce Dice, MD     ASSISTANT:           PROCEDURE DATE:  01/02/2010     PROCEDURE:  EGD with biopsy, Elease Hashimoto Dilation of the Esophagus     ASA CLASS:  Class II     INDICATIONS:  1) dysphagia           MEDICATIONS:   Fentanyl 50 mcg IV, Versed 5 mg IV, glycopyrrolate     (Robinal) 0.2 mg IV     TOPICAL ANESTHETIC:  Cetacaine Spray           DESCRIPTION OF PROCEDURE:   After the risks benefits and     alternatives of the procedure were thoroughly explained, informed     consent was obtained.  The EG-[id number] ([id number]) endoscope was     introduced through the mouth and advanced to the third portion of     the duodenum, without limitations.  The instrument was slowly     withdrawn as the mucosa was carefully examined.     <<PROCEDUREIMAGES>>           A stricture was found at the gastroesophageal junction. Early     esophageal stricture. #52Fr maloney passed with minimal resistance     (see image005). No heme on dilator tip  Mild gastritis was found     in the antrum. With standard forceps, a biopsy was obtained and     sent to pathology (see image002).  Duodenitis was found in the     bulb of the duodenum (see image003). Few superficial erosions  The     esophagus and gastroesophageal junction were completely normal in     appearance (see image006, image007, image009, and image011).     Normal proximal, mid and distal esophagus, larynx    Dilation was     then performed at the           COMPLICATIONS:  None       ENDOSCOPIC IMPRESSION:     1) Early Stricture at the gastroesophageal junction - s/p     dilitation     2) Mild gastritis in the antrum     3) Duodenitis in the bulb of duodenum     4) Normal esophagus proximal to stricture     RECOMMENDATIONS:     1) Nexium 40 mg     2) call office to schedule an office visit for 1 month           REPEAT EXAM:  No           ______________________________     Barbette Hair. Arlyce Dice, MD  CC:  Crawford Givens, M.D.           n.     eSIGNED:   Barbette Hair. Shamecka Hocutt at 01/02/2010 08:14 AM           Doristine Locks, [id number]  Note: An exclamation mark (!) indicates a result that was not dispersed into the flowsheet. Document Creation Date: 01/02/2010 8:15 AM _______________________________________________________________________  (1) Order result status: Final Collection or observation date-time: 01/02/2010 08:05 Requested date-time:  Receipt date-time:  Reported date-time:  Referring Physician:   Ordering Physician: Melvia Heaps [phone]) Specimen Source:  Source: Launa Grill Order Number: [id number] Lab site:

## 2011-01-20 NOTE — Letter (Signed)
Summary:  [facility]  [facility]   Imported By: Lanelle Bal 09/16/2010 09:01:53  _____________________________________________________________________  External Attachment:    Type:   Image     Comment:   External Document

## 2011-01-20 NOTE — Assessment & Plan Note (Signed)
Summary: TROUBLE SWALLOWING--CH.   History of Present Illness Visit Type: new patient  Primary GI MD: Melvia Heaps MD [facility] Primary Provider: Crawford Givens, MD Requesting Provider: n/a Chief Complaint: Dysphagia, chest pain, belching, loss of appetite, and diarrhea  History of Present Illness:   [name] is a 45 year old white male referred at the request of Dr. Para March for evaluation of dysphagia.  Approximately 2 weeks ago he developed a sore throat for which he received amoxicillin.   Strep test was negative.  He was subsequently placed on a Z-Pak because of persistent symptoms.  He was also complaining of neck and shoulder and throat pain and pain upon swallowing.  He underwent an MRI of the head and neck that was negative.  He has been complaining of a  persistent sensation of something in his upper chest or lower neck upon swallowing.  An ENT examination apparantly was negative.  Symptoms occur with both liquids and solids.  He denies pyrosis, per se.  He was also evaluated because of a decreased right nasolabial fold suggestive of either a palsy or a stroke.  An MRI did not confirm any acute vascular event.   GI Review of Systems    Reports belching, chest pain, dysphagia with liquids, dysphagia with solids, and  loss of appetite.      Denies abdominal pain, acid reflux, bloating, heartburn, nausea, vomiting, vomiting blood, weight loss, and  weight gain.      Reports diarrhea.     Denies anal fissure, black tarry stools, change in bowel habit, constipation, diverticulosis, fecal incontinence, heme positive stool, hemorrhoids, irritable bowel syndrome, jaundice, light color stool, liver problems, rectal bleeding, and  rectal pain.    Current Medications (verified): 1)  Aspir-Low 81 Mg Tbec (Aspirin) .... One Tablet By Mouth Once Daily 2)  Metoprolol Tartrate 25 Mg Tabs (Metoprolol Tartrate) .... One Tablet By Mouth Once Daily 3)  Levothyroxine Sodium 112 Mcg Tabs (Levothyroxine  Sodium) .... 1/2 Pill Twice A Week and Then One Whole Tablet By Mouth For Five Days 4)  Lisinopril-Hydrochlorothiazide 20-12.5 Mg Tabs (Lisinopril-Hydrochlorothiazide) .... One Tablet By Mouth Once Daily  Allergies (verified): 1)  ! Codeine  Past History:  Past Medical History: Hypertension Thyroid Disease  GERD  Past Surgical History: Left Elbow Surgery   Family History: No FH of Colon Cancer: Family History of Clotting disorder: Mother  Family History of Heart Disease: Father   Social History: Occupation: Clinical research associate  Single Patient has never smoked.  Alcohol Use - no Daily Caffeine Use: 2 daily  Illicit Drug Use - no Smoking Status:  never Drug Use:  no  Review of Systems  The patient denies allergy/sinus, anemia, anxiety-new, arthritis/joint pain, back pain, blood in urine, breast changes/lumps, change in vision, confusion, cough, coughing up blood, depression-new, fainting, fatigue, fever, headaches-new, hearing problems, heart murmur, heart rhythm changes, itching, muscle pains/cramps, night sweats, nosebleeds, shortness of breath, skin rash, sleeping problems, sore throat, swelling of feet/legs, swollen lymph glands, thirst - excessive, urination - excessive, urination changes/pain, urine leakage, vision changes, and voice change.    Vital Signs:  Patient profile:   45 year old male Height:      75 inches Weight:      222 pounds BMI:     27.85 BSA:     2.30 Pulse rate:   76 / minute Pulse rhythm:   regular BP sitting:   132 / 80  (left arm) Cuff size:   regular  Vitals Entered By: Tresa Endo  Katrinka Blazing CMA (December 31, 2009 1:47 PM)  Physical Exam  Additional Exam:  On physical exam he is a well-developed well-nourished male  skin: anicteric HEENT: normocephalic; PEERLA; no nasal or pharyngeal abnormalities neck: supple nodes: no cervical lymphadenopathy chest: clear to ausculatation and percussion heart: no murmurs, gallops, or rubs abd: soft, nontender;  BS normoactive; no abdominal masses, tenderness, organomegaly rectal: deferred ext: no cynanosis, clubbing, edema skeletal: no deformities neuro: oriented x 3; no focal abnormalities; the nasolabial fold on the right is decreased.    Impression & Recommendations:  Problem # 1:  DYSPHAGIA UNSPECIFIED (ICD-787.20)  Symptoms suggestive or foreign body or possibly early esophageal stricture.  Recommendations #1 upper endoscopy with dilatation as indicated  Risks, alternatives, and complications of the procedure, including bleeding, perforation, and possible need for surgery, were explained to the patient.  Patient's questions were answered.  Orders: ZEGD Balloon Dil (ZEGD Balloon)  Problem # 2:  HYPERTENSION NEC (ICD-997.91) Assessment: Comment Only  Patient Instructions: 1)  Conscious Sedation brochure given.  2)  Upper Endoscopy with Dilatation brochure given.  3)  Your EGD is scheduled for 01/02/2010 at [facility] Endo department 4)  CC Dr. Crawford Givens 5)  The medication list was reviewed and reconciled.  All changed / newly prescribed medications were explained.  A complete medication list was provided to the patient / caregiver.

## 2011-01-20 NOTE — Progress Notes (Signed)
Summary: Pharmacy Med Error  Phone Note From Pharmacy Call back at [phone]   Caller: Aurther Loft, pharmacist Call For: Dr. Arlyce Dice  Summary of Call: confusion on meds given to pt... pt took wrong medication Initial call taken by: Vallarie Mare,  May 01, 2010 2:35 PM  Follow-up for Phone Call        Per pharmacist, pt. was given Effexor 37.5mg  in error, instead of Hyomax.  Aurther Loft, the pharmacist, states she has spoken to pt., he has taken 4 doses of the Effexor, he has no c/o. She also states the Effexor won't interact with any of his other meds. She has his correct script filled and ready for him to pick-up. Terry aware I will notify Dr.Sim Choquette upon his return 05-05-10. Pt./Pharmacy to callback as needed. Follow-up by: Laureen Ochs LPN,  May 01, 2010 2:43 PM  Additional Follow-up for Phone Call Additional follow up Details #1::        ok Additional Follow-up by: Louis Meckel MD,  May 01, 2010 6:39 PM

## 2011-01-20 NOTE — Miscellaneous (Signed)
  Clinical Lists Changes  Medications: Added new medication of NEXIUM 40 MG  CPDR (ESOMEPRAZOLE MAGNESIUM) 1 capsule each day 30 minutes before meal - Signed Rx of NEXIUM 40 MG  CPDR (ESOMEPRAZOLE MAGNESIUM) 1 capsule each day 30 minutes before meal;  #30 x 2;  Signed;  Entered by: Louis Meckel MD;  Authorized by: Louis Meckel MD;  Method [facility] [phone]*, [address], [place], [place], [place]  [address], Ph: [phone] or [phone], Fax: [phone]    Prescriptions: NEXIUM 40 MG  CPDR (ESOMEPRAZOLE MAGNESIUM) 1 capsule each day 30 minutes before meal  #30 x 2   Entered and Authorized by:   Louis Meckel MD   Signed by:   Louis Meckel MD on 01/02/2010   Method used:   Electronically to        [facility] [phone]* (retail)       [address]       [place], [place]  [address]       Ph: [phone] or [phone]       Fax: [phone]   RxID:   [id number]

## 2011-01-20 NOTE — Progress Notes (Signed)
Summary: Ba Esophagram and REV Scheduled  Phone Note Outgoing Call Call back at [facility] Phone [phone] Call back at Work Phone [phone]   Call placed by: Laureen Ochs LPN,  April 09, 2010 12:24 PM Call placed to: Patient Summary of Call: F/U from Endoscopy on 04-08-10, pt. is scheduled for a Ba Esophagram at [facility] on 04-10-10 at 9am, NPO after 6am.  REV with Dr.Kaplan is 04-23-10 at 9:30am. Appt. info. given to pt. by phone. Pt. instructed to call back as needed.  Initial call taken by: Laureen Ochs LPN,  April 09, 2010 12:24 PM

## 2011-01-20 NOTE — Procedures (Signed)
Summary: Upper Endoscopy  Patient: [name] Note: All result statuses are Final unless otherwise noted.  Tests: (1) Upper Endoscopy (EGD)   EGD Upper Endoscopy       DONE     [facility]     [address]     [place], [place]  [address]           ENDOSCOPY PROCEDURE REPORT           PATIENT:  [name], [name]  MR#:  [id number]     BIRTHDATE:  Jul 16, 1966, 43 yrs. old  GENDER:  male           ENDOSCOPIST:  Barbette Hair. Arlyce Dice, MD     Referred by:           PROCEDURE DATE:  04/08/2010     PROCEDURE:  EGD, diagnostic     ASA CLASS:  Class III     INDICATIONS:  chest pain, reflux symptoms despite therapy           MEDICATIONS:   Fentanyl 75 mcg IV, Versed 8 mg IV, glycopyrrolate     (Robinal) 0.2 mg IV, 0.6cc simethancone 0.6 cc PO     TOPICAL ANESTHETIC:  Exactacain Spray           DESCRIPTION OF PROCEDURE:   After the risks benefits and     alternatives of the procedure were thoroughly explained, informed     consent was obtained.  The LB [id number] endoscope was     introduced through the mouth and advanced to the third portion of     the duodenum, without limitations.  The instrument was slowly     withdrawn as the mucosa was fully examined.     <<PROCEDUREIMAGES>>           other findings in the proximal esophagus (see image9 and image8).     Capacious esophagus, otherwise normal  Otherwise the examination     was normal (see image4).    Retroflexed views revealed no     abnormalities.    The scope was then withdrawn from the patient     and the procedure completed.           COMPLICATIONS:  None           ENDOSCOPIC IMPRESSION:     1) Other findings in the proximal esophagus - dilated esophagus     suggesting motility disorder     2) Otherwise normal examination     RECOMMENDATIONS:  1) discontinue zegerid     2) begin dexilant     3) barium swallow     4) schedule OV for 2     weeks           REPEAT EXAM:  No        ______________________________     Barbette Hair. Arlyce Dice, MD           CC:  Crawford Givens           n.     eSIGNED:   Barbette Hair. Mikisha Roseland at 04/08/2010 04:00 PM           Doristine Locks, [id number]  Note: An exclamation mark (!) indicates a result that was not dispersed into the flowsheet. Document Creation Date: 04/08/2010 4:02 PM _______________________________________________________________________  (1) Order result status: Final Collection or observation date-time: 04/08/2010 15:48 Requested date-time:  Receipt date-time:  Reported date-time:  Referring Physician:   Ordering Physician: Melvia Heaps [phone]) Specimen Source:  Source: Launa Grill Order Number: [id number] Lab site:

## 2011-01-20 NOTE — Progress Notes (Signed)
Summary: Drug Contraindication  Phone Note From Pharmacy   Caller: Terry-Pharmacist    [phone] Call For: Dr.Kaplan  Summary of Call: The pharmacy called, there is a contraindication with Hyomax and K+, anticholenergic will delay gastric emptying, resulting in K+ remaining in GI tract longer and pt. getting a higher dose.   [facility] PLEASE ADVISE  Initial call taken by: Laureen Ochs LPN,  Apr 29, 2010 12:22 PM  Follow-up for Phone Call        He can reduce the k dose by half Follow-up by: Louis Meckel MD,  Apr 29, 2010 2:00 PM  Additional Follow-up for Phone Call Additional follow up Details #1::        Above MD orders reviewed with patient. He will also advise his PCP on K+ dose change. Pt. instructed to call back as needed.  Additional Follow-up by: Laureen Ochs LPN,  Apr 29, 2010 2:36 PM

## 2011-01-20 NOTE — Assessment & Plan Note (Signed)
Summary: F/U from seeing [name]/rs   History of Present Illness Visit Type: Follow-up Visit Primary GI MD: Marcus Heaps MD [facility] Primary Provider: Crawford Givens, MD Requesting Provider: n/a Chief Complaint: Patinet here to follow up from visit with [name] and states that he is doing no better. He complains that he is now crushing his pills and is on a liquid diet. He is even having some liquid dysphagia at this point. Patinet taking very high doses of Tylenol for jaw pain. History of Present Illness:   [name] returns for followup of his dysphagia.  He was recently diagnosed with a pulmonary embolus for which he was placed on Coumadin.  Dysphagia remains a problem.  He has lost over 30 pounds.  He is now having dysphagia to both liquids and solids.  He continues to complain of numbness in both sides of the space and numbness in the area of the left foot.  Extensive workup for a hypercoagulable state was negative and his CRP was normal.  A factor V Leiden mutation is still pending.  He has a stable thoracic  aortic aneurysm.   GI Review of Systems    Reports abdominal pain, acid reflux, dysphagia with liquids, dysphagia with solids, and  loss of appetite.     Location of  Abdominal pain: epigastric area.    Denies belching, bloating, chest pain, heartburn, nausea, vomiting, vomiting blood, weight loss, and  weight gain.        Denies anal fissure, black tarry stools, change in bowel habit, constipation, diarrhea, diverticulosis, fecal incontinence, heme positive stool, hemorrhoids, irritable bowel syndrome, jaundice, light color stool, liver problems, rectal bleeding, and  rectal pain.    Current Medications (verified): 1)  Aspir-Low 81 Mg Tbec (Aspirin) .... One Tablet By Mouth Once Daily 2)  Metoprolol Tartrate 1 Mg/ml Soln (Metoprolol Tartrate) .... Take 2.5 Ml By Mouth Once Daily 3)  Levothyroxine Sodium 112 Mcg Tabs (Levothyroxine Sodium) .... 1/2 Pill Twice A Week and Then One Whole  Tablet By Mouth For Five Days 4)  Lisinopril-Hydrochlorothiazide 20-12.5 Mg Tabs (Lisinopril-Hydrochlorothiazide) .... One Tablet By Mouth Once Daily 5)  Zegerid 40-1680 Mg Pack (Omeprazole-Sodium Bicarbonate) .... One Pack Dissolved in 2 Tbsp of Water Two Times A Day 6)  Carafate 1 Gm/61ml Susp (Sucralfate) .... Take 1 Gram 4 Times Daily 7)  Coumadin 7.5 Mg Tabs (Warfarin Sodium) .... Take One By Mouth Mon, Wed, Fri 8)  Coumadin 5 Mg Tabs (Warfarin Sodium) .... Take One By Mouth Tues, Thurs, Sat, Sun 9)  Ativan 1 Mg Tabs (Lorazepam) .... Take One By Mouth At Bedtime 10)  Tylenol Extra Strength 500 Mg Tabs (Acetaminophen) .... Take 4-6 Per Day.  Allergies (verified): 1)  ! Codeine  Past History:  Past Surgical History: Last updated: 12/31/2009 Left Elbow Surgery   Family History: Last updated: 12/31/2009 No FH of Colon Cancer: Family History of Clotting disorder: Mother  Family History of Heart Disease: Father   Social History: Last updated: 12/31/2009 Occupation: Clinical research associate  Single Patient has never smoked.  Alcohol Use - no Daily Caffeine Use: 2 daily  Illicit Drug Use - no  Past Medical History: Hypertension Thyroid Disease  GERD Ascending Thoracic Aneurysm, 4.1 cm, on CTA chest 2008 Pulmonary Embolism  Vital Signs:  Patient profile:   45 year old male Height:      75 inches Weight:      210 pounds BMI:     26.34 Pulse rate:   76 / minute Pulse rhythm:  regular BP sitting:   120 / 64  (left arm) Cuff size:   regular  Vitals Entered By: Harlow Mares CMA Duncan Dull) (January 29, 2010 9:33 AM)  Physical Exam  Additional Exam:  Use well-developed harsh male  HEENT exam is pertinent for a decreased right nasolabial fold   Impression & Recommendations:  Problem # 1:  DYSPHAGIA (ZOX-096.04) This is neurogenic in origin.   A  primary GI dysmotility disorder is less likely.  Overall the patient has an amalgamation  of complaints including neurologic  complaints causing significant oral pharyngeal dysphagia, a pulmonary embolus, and a thoracic aortic aneurysm.  Hypercoagulable workup was negative.  Factor V Leideig mutation is still pending.  CRP is normal.  I remain suspicious that he may have a systemic inflammatory disorder such as vasculitis or a collagen vascular disease.  Multiple sclerosis has been considered but ruled out by neurology.  Recommendations #1 liquid supplements her dysphagia study report #2 referred to rheumatology for further evaluation - [name]  Patient Instructions: 1)  CC Dr. Stacey Drain 2)  cc: Dr. Crawford Stone 3)  cc: Dr. Terrace Arabia 4)  We will contact you with your appointment with Dr Kellie Stone as soon as it becomes available 5)  The medication list was reviewed and reconciled.  All changed / newly prescribed medications were explained.  A complete medication list was provided to the patient / caregiver.  Appended Document: Orders Update    Clinical Lists Changes  Problems: Added new problem of VASCULITIS (ICD-447.6) Orders: Added new Test order of Rheumatology Referral (Rheumatology Refer) - Signed

## 2011-01-20 NOTE — Progress Notes (Signed)
Summary: TRIAGE & GES SCHEDULED  Phone Note Call from Patient Call back at Home Phone [phone]   Caller: Patient Call For: Dr. Arlyce Dice Reason for Call: Talk to Nurse Summary of Call: Dexilant not helping Initial call taken by: Vallarie Mare,  May 12, 2010 9:42 AM  Follow-up for Phone Call        Still symptomatic on Dexilant.Offered nexium samples to try but insurance won't pay for it.Has tried Prilosec and Zegerid powder in past.Requesting another ppi be ordered.Pt. aware Dr.Angles Trevizo is out of the office until Wednesday. Follow-up by: Teryl Lucy RN,  May 12, 2010 3:13 PM  Additional Follow-up for Phone Call Additional follow up Details #1::        try protonix two times a day needs GES Additional Follow-up by: Louis Meckel MD,  May 13, 2010 12:27 PM    Additional Follow-up for Phone Call Additional follow up Details #2::    Above MD orders reviewed with patient. Script for Protonix sent to his pharmacy. GES is scheduled at [facility] on 05-27-10 at 9am, NPO and no stomache meds for 8 hours prior. All instructions reviewed with pt. by phone and mailed to him. Pt. instructed to call back as needed.  Follow-up by: Laureen Ochs LPN,  May 13, 2010 1:13 PM  New/Updated Medications: PROTONIX 40 MG  TBEC (PANTOPRAZOLE SODIUM) 1 twice a day 30 minutes before meals Prescriptions: PROTONIX 40 MG  TBEC (PANTOPRAZOLE SODIUM) 1 twice a day 30 minutes before meals  #60 x 1   Entered by:   Laureen Ochs LPN   Authorized by:   Louis Meckel MD   Signed by:   Laureen Ochs LPN on 64/40/3474   Method used:   [facility] to        [facility] [phone]* (retail)       [address]       [place], [place]  [address]       Ph: [phone] or [phone]       Fax: [phone]   RxID:   [id number]

## 2011-01-20 NOTE — Progress Notes (Signed)
Summary: TRIAGE  Phone Note Call from Patient Call back at Home Phone [phone]   Caller: Patient Call For: Arlyce Dice Reason for Call: Talk to Nurse Summary of Call: Patient has pill stuck in his throat wants to know what to do. Initial call taken by: Tawni Levy,  January 08, 2010 9:53 AM  Follow-up for Phone Call        Pt. took a Prilosec at 7:30am and feels like it is still stuck in his throat. Since then he has eaten jello, pudding and drank hot tea, "It all feels like it went right by it"  Pt. states he has a pain in the back of his neck, SOB and sweating. Pt. had a swallowing study yesterday and saw Mike Gip PAC on 01-06-10.  [facility]  PLEASE REVIEW AND ADVISE  Follow-up by: Laureen Ochs LPN,  January 08, 2010 10:11 AM  Additional Follow-up for Phone Call Additional follow up Details #1::        needs ENT exam today (he's already established with an ENT MD) Additional Follow-up by: Louis Meckel MD,  January 08, 2010 12:55 PM    Additional Follow-up for Phone Call Additional follow up Details #2::    Above MD orders reviewed with patient. Pt. instructed to call back as needed.  Follow-up by: Laureen Ochs LPN,  January 08, 2010 12:57 PM

## 2011-01-20 NOTE — Miscellaneous (Signed)
  Clinical Lists Changes  Medications: Removed medication of ZEGERID 40-1680 MG PACK (OMEPRAZOLE-SODIUM BICARBONATE) one pack dissolved in 2 tbsp of water two times a day Added new medication of DEXILANT 60 MG CPDR (DEXLANSOPRAZOLE) 1 tab 1-2 times  daily - Signed Rx of DEXILANT 60 MG CPDR (DEXLANSOPRAZOLE) 1 tab 1-2 times  daily;  #30 x 2;  Signed;  Entered by: Louis Meckel MD;  Authorized by: Louis Meckel MD;  Method used: Electronically to [facility] [phone]*, [address], [place], [place], [place]  [address], Ph: [phone] or [phone], Fax: [phone]    Prescriptions: DEXILANT 60 MG CPDR (DEXLANSOPRAZOLE) 1 tab 1-2 times  daily  #30 x 2   Entered and Authorized by:   Louis Meckel MD   Signed by:   Louis Meckel MD on 04/08/2010   Method used:   Electronically to        [facility] [phone]* (retail)       [address]       [place], [place]  [address]       Ph: [phone] or [phone]       Fax: [phone]   RxID:   [id number]

## 2011-01-20 NOTE — Letter (Signed)
Summary: Generic Letter  [facility]  [address] [place], [place] [address]   Phone: [phone]  Fax: [phone]    12/31/2009  [name] [address] [place], [place]  [address]  Dear [name],  It is my pleasure to have treated you recently as a new patient in my office. I appreciate your confidence and the opportunity to participate in your care.  Since I do have a busy inpatient endoscopy schedule and office schedule, my office hours vary weekly. I am, however, available for emergency calls everyday through my office. If I am not available for an urgent office appointment, another one of our gastroenterologist will be able to assist you.  My well-trained staff are prepared to help you at all times. For emergencies after office hours, a physician from our Gastroenterology section is always available through my 24 hour answering service  Once again I welcome you as a new patient and I look forward to a happy and healthy relationship               Sincerely,   Melvia Heaps MD  Appended Document: Generic Letter letter mailed

## 2011-01-20 NOTE — Progress Notes (Signed)
Summary: CONDITION UPDATE  Phone Note Call from Patient Call back at Home Phone [phone]   Caller: Patient Call For: Arlyce Dice Reason for Call: Talk to Nurse Complaint: Abdominal Pain, Chest Pain Summary of Call: pt calling to report that he is not feeling any better since appt earlier this week... stomach still feels like it is burning and this up through esophagus... has also started seeing blood in his stool yesterday Initial call taken by: Vallarie Mare,  January 16, 2010 9:41 AM  Follow-up for Phone Call        Pt. was seen by Willette Cluster NP 01-14-10, has a Chest CT angio. scheduled for today.  States his symptoms are the same, he is concerned. He also c/o loose stools for 2 days, some blood on the tissue X2.  1) Continue with the recommendations given to you at your last appt. 2) Immodium as needed for diarrhea. 3) Prep.H ointment OTC for rectal irritation 4) Have CT done today. 5) We will call you after Dr.Tatiyana Foucher has reviewed your CT report. 6) If symptoms become worse call back immediately or go to ER.   Follow-up by: Laureen Ochs LPN,  January 16, 2010 9:54 AM  Additional Follow-up for Phone Call Additional follow up Details #1::        ok Additional Follow-up by: Louis Meckel MD,  January 16, 2010 10:16 AM

## 2011-01-20 NOTE — Progress Notes (Signed)
Summary: Triage  Phone Note Call from Patient Call back at Home Phone [phone]   Caller: Patient Call For: Dr. Arlyce Dice Reason for Call: Talk to Nurse Summary of Call: Pt. said he feel like he has something "stuck" in his throat. Also a burning sensation Initial call taken by: Karna Christmas,  January 03, 2010 4:27 PM  Follow-up for Phone Call        Says about 1 hr. ago he started to have burning in throat and I had him drink some Ginger-ale while I was  waiting on phone and he did not have dysphagia but  said it caused him to burp. Follow-up by: Teryl Lucy RN,  January 03, 2010 4:37 PM  Additional Follow-up for Phone Call Additional follow up Details #1::        Pt. instructed to call Dr.on call over the week end if dysphagia occurs and is to continue with previous instructions Additional Follow-up by: Teryl Lucy RN,  January 03, 2010 5:18 PM

## 2011-01-20 NOTE — Letter (Signed)
[facility]   Imported By: Lennie Odor 06/06/2010 15:25:37  _____________________________________________________________________  External Attachment:    Type:   Image     Comment:   External Document  Appended Document: [facility]    Clinical Lists Changes  Problems: Added new problem of PE (ICD-415.19) - plan for anticoagulation per Dr. Clelia Croft through 9/11.

## 2011-01-20 NOTE — Assessment & Plan Note (Signed)
Summary: F/U [facility]                   DEBORAH   History of Present Illness Visit Type: Follow-up Visit Primary GI MD: Melvia Heaps MD [facility] Primary Provider: Duane Lope, MD  Requesting Provider: n/a Chief Complaint: Patient following up from [facility]. He states that the Protonix is working better than the [employer]. He has gone back to work, and he has been to [facility] and he has an ultrasound of his neck and head and a bubble study to see if he has a hole in his heart.  History of Present Illness:   [name] has returned for followup of his dysphagia and pyrosis.  Both are improved.  He is currently taking  protonix.  He has occasional breakthrough pyrosis which tends to occur in the early evening.  Gastric imaging scan was borderline delayed.   GI Review of Systems      Denies abdominal pain, acid reflux, belching, bloating, chest pain, dysphagia with liquids, dysphagia with solids, heartburn, loss of appetite, nausea, vomiting, vomiting blood, weight loss, and  weight gain.        Denies anal fissure, black tarry stools, change in bowel habit, constipation, diarrhea, diverticulosis, fecal incontinence, heme positive stool, hemorrhoids, irritable bowel syndrome, jaundice, light color stool, liver problems, rectal bleeding, and  rectal pain.    Current Medications (verified): 1)  Aspir-Low 81 Mg Tbec (Aspirin) .... One Tablet By Mouth Once Daily 2)  Metoprolol Tartrate 50 Mg Tabs (Metoprolol Tartrate) .... Take 1/2 Tablet By Mouth Once Daily 3)  Levothyroxine Sodium 112 Mcg Tabs (Levothyroxine Sodium) .Marland Kitchen.. 1 and 1/2 Pill Two Days A Week 4)  Lisinopril-Hydrochlorothiazide 20-12.5 Mg Tabs (Lisinopril-Hydrochlorothiazide) .... One Tablet By Mouth Once Daily 5)  Coumadin 5 Mg Tabs (Warfarin Sodium) .... Take One By Mouth Once Daily As Directed 6)  Ativan 1 Mg Tabs (Lorazepam) .... Take One By Mouth Once Daily (Should Be Finished Tomorrow) 7)  Tylenol Extra Strength 500 Mg Tabs (Acetaminophen) ....  As Needed 8)  Zoloft 50 Mg Tabs (Sertraline Hcl) .... Take 1 Tab By Mouth Once Daily 9)  Protonix 40 Mg  Tbec (Pantoprazole Sodium) .Marland Kitchen.. 1 Twice A Day 30 Minutes Before Meals  Allergies (verified): No Known Drug Allergies  Past History:  Past Medical History: Reviewed history from 01/29/2010 and no changes required. Hypertension Thyroid Disease  GERD Ascending Thoracic Aneurysm, 4.1 cm, on CTA chest 2008 Pulmonary Embolism  Past Surgical History: Reviewed history from 12/31/2009 and no changes required. Left Elbow Surgery   Family History: Reviewed history from 12/31/2009 and no changes required. No FH of Colon Cancer: Family History of Clotting disorder: Mother  Family History of Heart Disease: Father   Social History: Reviewed history from 12/31/2009 and no changes required. 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  Alcohol Use - no Daily Caffeine Use: 2 daily  Illicit Drug Use - no  Vital Signs:  Patient profile:   45 year old male Height:      75 inches Weight:      200.2 pounds BMI:     25.11 Pulse rate:   78 / minute Pulse rhythm:   regular BP sitting:   124 / 80  (right arm) Cuff size:   regular  Vitals Entered By: Harlow Mares CMA [facility] Dull) (June 04, 2010 10:01 AM)   Impression & Recommendations:  Problem # 1:  DYSPHAGIA UNSPECIFIED (ICD-787.20) Assessment Improved He has neurogenic dysphagia which is improved.  No further therapy is anticipated  Problem # 2:  HEARTBURN (ICD-787.1) Assessment: Improved Patient will continue Protonix and take antacids as needed  Patient Instructions: 1)  Copy sent to : Hessie Diener Ross,MD 2)  The medication list was reviewed and reconciled.  All changed / newly prescribed medications were explained.  A complete medication list was provided to the patient / caregiver.

## 2011-01-20 NOTE — Progress Notes (Signed)
Summary: needs chest CT-scheduled for 01-16-10  Phone Note Outgoing Call   Summary of Call: call patient and let him know after reviewing things he needs follow-up of mildly enlarged aorta seen on CT chest in 2008 (does not appear that this has been done to date, please clarify) I have discussed with radiology and recommend a CT of chest with IV contrast to follow-up dilated thoracic aorta (seen on 2008 CT chest) Iva Boop MD, [facility]  January 15, 2010 10:00 AM   Follow-up for Phone Call        Called pt and told him we were scheduled a CT of the chest.  Called [place] CT and scheduled CT Angio Chest for tom 01-16-10 at 1:30 PM . Called pt to inform him and gave him instructions  to come to [place] CT at 1: 15 PM.  He is to be NPO after 11:30AM.  Follow-up by: Joselyn Glassman,  January 15, 2010 11:10 AM  New Problems: CT, CHEST, ABNORMAL (ICD-793.1)   New Problems: CT, CHEST, ABNORMAL (ICD-793.1)

## 2011-01-20 NOTE — Assessment & Plan Note (Signed)
Summary: STOMACHE/CHEST BURNING              [name]   History of Present Illness Visit Type: Follow-up Visit Primary GI MD: Melvia Heaps MD [facility] Primary Provider: C. Duane Lope, MD Requesting Provider: n/a Chief Complaint: generalized abdominal pain that radiates to chest and around right side, pt is also having problems with swallowing History of Present Illness:   [name] has returned for evaluation of upper abdominal and chest pain.  He is having spontaneous burning pain in his upper abdomen and chest.  This is worsened postprandially and with swallowing.  He often will regurgitate or vomit immediately after swallowing.  He is taking Zegerid and Carafate.  Medicines have not changed.  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  Dysphagia to solids and liquids remains.   GI Review of Systems    Reports abdominal pain, acid reflux, chest pain, dysphagia with liquids, dysphagia with solids, heartburn, nausea, and  vomiting.     Location of  Abdominal pain: generalized.    Denies belching, bloating, loss of appetite, vomiting blood, weight loss, and  weight gain.      Reports constipation, diarrhea, and  rectal pain.     Denies anal fissure, black tarry stools, change in bowel habit, diverticulosis, fecal incontinence, heme positive stool, hemorrhoids, irritable bowel syndrome, jaundice, light color stool, liver problems, and  rectal bleeding.    Current Medications (verified): 1)  Aspir-Low 81 Mg Tbec (Aspirin) .... One Tablet By Mouth Once Daily 2)  Metoprolol Tartrate 1 Mg/ml Soln (Metoprolol Tartrate) .... Take 2.5 Ml By Mouth Once Daily 3)  Levothyroxine Sodium 112 Mcg Tabs (Levothyroxine Sodium) .... 1/2 Pill Twice A Week and Then One Whole Tablet By Mouth For Five Days 4)  Lisinopril-Hydrochlorothiazide 20-12.5 Mg Tabs (Lisinopril-Hydrochlorothiazide) .... One Tablet By Mouth Once Daily 5)  Zegerid 40-1680 Mg Pack (Omeprazole-Sodium Bicarbonate) .... One Pack Dissolved in  2 Tbsp of Water Two Times A Day 6)  Carafate 1 Gm/73ml Susp (Sucralfate) .... Take 1 Gram 4 Times Daily 7)  Coumadin 7.5 Mg Tabs (Warfarin Sodium) .... Take One Tab By Mouth As Directed 8)  Ativan 1 Mg Tabs (Lorazepam) .... Take One By Mouth At Bedtime 9)  Tylenol Extra Strength 500 Mg Tabs (Acetaminophen) .... Take 4-6 Per Day. 10)  Zoloft 50 Mg Tabs (Sertraline Hcl) .... Take 1/2 Tab By Mouth Once Daily  Allergies (verified): No Known Drug Allergies  Past History:  Past Medical History: Reviewed history from 01/29/2010 and no changes required. Hypertension Thyroid Disease  GERD Ascending Thoracic Aneurysm, 4.1 cm, on CTA chest 2008 Pulmonary Embolism  Past Surgical History: Reviewed history from 12/31/2009 and no changes required. Left Elbow Surgery   Family History: Reviewed history from 12/31/2009 and no changes required. No FH of Colon Cancer: Family History of Clotting disorder: Mother  Family History of Heart Disease: Father   Social History: Reviewed history from 12/31/2009 and no changes required. 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  Alcohol Use - no Daily Caffeine Use: 2 daily  Illicit Drug Use - no  Review of Systems       The patient complains of fatigue, shortness of breath, sore throat, thirst - excessive, and urination - excessive.  The patient denies allergy/sinus, anemia, anxiety-new, arthritis/joint pain, back pain, blood in urine, breast changes/lumps, confusion, cough, coughing up blood, depression-new, fainting, fever, headaches-new, hearing problems, heart murmur, heart rhythm changes, itching, muscle pains/cramps, night sweats, nosebleeds, skin  rash, sleeping problems, swelling of feet/legs, swollen lymph glands, urination changes/pain, urine leakage, vision changes, and voice change.    Vital Signs:  Patient profile:   45 year old male Height:      75 inches Weight:      203 pounds BMI:     25.46 Pulse rate:   80 /  minute Pulse rhythm:   regular BP sitting:   110 / 76  (left arm) Cuff size:   regular  Vitals Entered By: Francee Piccolo CMA Duncan Dull) (April 07, 2010 9:34 AM)  Physical Exam  Additional Exam:  On physical exam he is a well-developed well-nourished male  HEENT status within normal limits.  There is no suggestion of  oral candidiasis   Impression & Recommendations:  Problem # 1:  CHEST PAIN UNSPECIFIED (ICD-786.50) He could have candida esophagitis.  Active ulcer is unlikely.  Symptoms could be related to GERD though he is on high-dose PPI therapy.  Recommendations #1 upper endoscopy  Problem # 2:  PRIMARY HYPERCOAGULABLE STATE (ICD-289.81) Assessment: Comment Only Patient has a factor V leidig  mutation for which he is on Coumadin  Problem # 3:  DYSPHAGIA OROPHARYNGEAL PHASE (VWU-981.19) Assessment: Unchanged  Problem # 5:  COAGULOPATHY, COUMADIN-INDUCED (ICD-286.5) Assessment: Comment Only  Other Orders: EGD (EGD)  Patient Instructions: 1)  cc Dr. Vianne Bulls 2)  cc Dr. Stacey Drain 3)  cc Dr. Cephas Darby 4)  Your EGD is scheduled for 04/08/2010 at 4pm 5)  You will remain on coumadin for your procedure per Dr Arlyce Dice 6)  The medication list was reviewed and reconciled.  All changed / newly prescribed medications were explained.  A complete medication list was provided to the patient / caregiver.

## 2011-01-20 NOTE — Letter (Signed)
Summary: EGD Instructions  [facility]  [address] [place], [place] [address]   Phone: [phone]  Fax: [phone]       [name]    July 07, 1966    MRN: [id number]       Procedure Day /Date:TUESDAY 04/08/2010     Arrival Time:3PM     Procedure Time:4PM     Location of Procedure:                    X  [facility] (4th Floor) _  PREPARATION FOR ENDOSCOPY   On 04/08/2010 THE DAY OF THE PROCEDURE:  1.   No solid foods, milk or milk products are allowed after midnight the night before your procedure.  2.   Do not drink anything colored red or purple.  Avoid juices with pulp.  No orange juice.  3.  You may drink clear liquids until 2PM , which is 2 hours before your procedure.                                                                                                CLEAR LIQUIDS INCLUDE: Water Jello Ice Popsicles Tea (sugar ok, no milk/cream) Powdered fruit flavored drinks Coffee (sugar ok, no milk/cream) Gatorade Juice: apple, white grape, white cranberry  Lemonade Clear bullion, consomm, broth Carbonated beverages (any kind) Strained chicken noodle soup Hard Candy   MEDICATION INSTRUCTIONS  Unless otherwise instructed, you should take regular prescription medications with a small sip of water as early as possible the morning of your procedure.  Stop taking Coumadin on  _  (5 days before procedure). STAY ON COUMADIN PER DR KAPLAN             OTHER INSTRUCTIONS  You will need a responsible adult at least 43 years of age to accompany you and drive you home.   This person must remain in the waiting room during your procedure.  Wear loose fitting clothing that is easily removed.  Leave jewelry and other valuables at home.  However, you may wish to bring a book to read or an iPod/MP3 player to listen to music as you wait for your procedure to start.  Remove all body piercing jewelry and leave at home.  Total time  from sign-in until discharge is approximately 2-3 hours.  You should go home directly after your procedure and rest.  You can resume normal activities the day after your procedure.  The day of your procedure you should not:   Drive   Make legal decisions   Operate machinery   Drink alcohol   Return to work  You will receive specific instructions about eating, activities and medications before you leave.    The above instructions have been reviewed and explained to me by   _______________________    I fully understand and can verbalize these instructions _____________________________ Date _________

## 2011-01-20 NOTE — Letter (Signed)
Summary: EGD Instructions  [facility]  [address] [place], [place] [address]   Phone: [phone]  Fax: [phone]       [name]    05-23-1966    MRN: [id number]       Procedure Day /Date:01/02/2010 THURSDAY     Arrival Time: 6:30AM     Procedure Time:7:30AM     Location of Procedure:                     X  [facility] ( Outpatient Registration)    PREPARATION FOR ENDOSCOPY/BALLOON DIL   On 01/02/2010  THE DAY OF THE PROCEDURE:  1.   No solid foods, milk or milk products are allowed after midnight the night before your procedure.  2.   Do not drink anything colored red or purple.  Avoid juices with pulp.  No orange juice.  3.  You may drink clear liquids until 3:30AM, which is 4 hours before your procedure.                                                                                                CLEAR LIQUIDS INCLUDE: Water Jello Ice Popsicles Tea (sugar ok, no milk/cream) Powdered fruit flavored drinks Coffee (sugar ok, no milk/cream) Gatorade Juice: apple, white grape, white cranberry  Lemonade Clear bullion, consomm, broth Carbonated beverages (any kind) Strained chicken noodle soup Hard Candy   MEDICATION INSTRUCTIONS  Unless otherwise instructed, you should take regular prescription medications with a small sip of water as early as possible the morning of your procedure.            OTHER INSTRUCTIONS  You will need a responsible adult at least 43 years of age to accompany you and drive you home.   This person must remain in the waiting room during your procedure.  Wear loose fitting clothing that is easily removed.  Leave jewelry and other valuables at home.  However, you may wish to bring a book to read or an iPod/MP3 player to listen to music as you wait for your procedure to start.  Remove all body piercing jewelry and leave at home.  Total time from sign-in until discharge is approximately 2-3  hours.  You should go home directly after your procedure and rest.  You can resume normal activities the day after your procedure.  The day of your procedure you should not:   Drive   Make legal decisions   Operate machinery   Drink alcohol   Return to work  You will receive specific instructions about eating, activities and medications before you leave.    The above instructions have been reviewed and explained to me by   _______________________    I fully understand and can verbalize these instructions _____________________________ Date _________

## 2011-01-20 NOTE — Progress Notes (Signed)
Summary: Triage-? pt. Hematologist  Phone Note From Other Clinic   Caller: Renee @ Dr. Cyndie Chime  [phone] Call For: Dr. Arlyce Dice Summary of Call: Office received a dictation on 04-17-10 and Dr. Cyndie Chime wants to know why he received it. He wants to know if Dr. Arlyce Dice want him to see this pt? Initial call taken by: Karna Christmas,  May 02, 2010 3:04 PM  Follow-up for Phone Call        DR.KAPLAN--She received the dictation from his 04-07-10 appt. with you, [facility] states pt. has never been seen by them.  Please advise. Follow-up by: Laureen Ochs LPN,  May 02, 2010 3:21 PM  Additional Follow-up for Phone Call Additional follow up Details #1::        I thought this was his hematologist.  please send note to his hematologist Additional Follow-up by: Louis Meckel MD,  May 02, 2010 3:51 PM    Additional Follow-up for Phone Call Additional follow up Details #2::    Pt. states he will be going to see Dr.Shadad, but hasn't made an appointment yet. Renee will call pt. to get that appt. scheduled.  Follow-up by: Laureen Ochs LPN,  May 02, 2010 4:02 PM

## 2011-01-20 NOTE — Assessment & Plan Note (Signed)
Summary: STOMACHE BURNING AND CHILLS     (DR.KAPLAN PT.)          [name]   History of Present Illness Visit Type: Follow-up Visit Primary GI MD: Melvia Heaps MD [facility] Primary Provider: Crawford Givens, MD Requesting Provider: n/a Chief Complaint: Pt states he woke up at 4 in the morning with a lot of burning sensation in his chest. Pt took a Zegerid this morning around 8:30am and ate a light breakfast. Pt is still having the burning and it radiates up his esophagus. Pt has had some increase in belching, nausea, globus sensation.  History of Present Illness:    Here with multiple GI and general complaints.  His main concern is burning in epigastrium and chest which started at 4am today. Burning 8/10 on pain scale. No SOB. He has some "tighness" in LUQ and associated nausea. Still having solid food dysphagia despite recent esophageal dilation. His chin is still numb. His jaws are sore, saw dentist yesterday and feels secondary to teeth grinding. Consuming only soups and liquids.  Continues to lose weight. On Zegerid twice day.  Change in bowel habits:  Gives two-three week history of loose stool, but only once a day. Polyuria- gives 3 week history of polyuria  No recent medication changes. He is not diabetic. No dysuria.  Has recurrent numbness in left hand, had this when he went to ER and had MRI done.      GI Review of Systems    Reports belching, heartburn, and  nausea.     Location of  Abdominal pain: LUQ.    Denies acid reflux, bloating, chest pain, dysphagia with liquids, dysphagia with solids, loss of appetite, vomiting, vomiting blood, weight loss, and  weight gain.        Denies anal fissure, black tarry stools, change in bowel habit, constipation, diarrhea, diverticulosis, fecal incontinence, heme positive stool, hemorrhoids, irritable bowel syndrome, jaundice, light color stool, liver problems, rectal bleeding, and  rectal pain.    Current Medications (verified): 1)  Aspir-Low 81  Mg Tbec (Aspirin) .... One Tablet By Mouth Once Daily 2)  Metoprolol Tartrate 25 Mg Tabs (Metoprolol Tartrate) .... One Tablet By Mouth Once Daily 3)  Levothyroxine Sodium 112 Mcg Tabs (Levothyroxine Sodium) .... 1/2 Pill Twice A Week and Then One Whole Tablet By Mouth For Five Days 4)  Lisinopril-Hydrochlorothiazide 20-12.5 Mg Tabs (Lisinopril-Hydrochlorothiazide) .... One Tablet By Mouth Once Daily 5)  Zegerid 40-1680 Mg Pack (Omeprazole-Sodium Bicarbonate) .... One Pack Dissolved in 2 Tbsp of Water Two Times A Day  Allergies (verified): 1)  ! Codeine  Past History:  Past Medical History: Hypertension Thyroid Disease  GERD Ascending Thoracic Aneurysm, 4.1 cm, on CTA chest 2008  Past Surgical History: Reviewed history from 12/31/2009 and no changes required. Left Elbow Surgery   Family History: Reviewed history from 12/31/2009 and no changes required. No FH of Colon Cancer: Family History of Clotting disorder: Mother  Family History of Heart Disease: Father   Social History: Reviewed history from 12/31/2009 and no changes required. 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  Alcohol Use - no Daily Caffeine Use: 2 daily  Illicit Drug Use - no  Review of Systems       The patient complains of urination - excessive.  The patient denies allergy/sinus, anemia, anxiety-new, arthritis/joint pain, back pain, blood in urine, breast changes/lumps, change in vision, confusion, cough, coughing up blood, depression-new, fainting, fatigue, fever, headaches-new, hearing problems, heart murmur, heart rhythm changes, itching,  menstrual pain, muscle pains/cramps, night sweats, nosebleeds, pregnancy symptoms, shortness of breath, skin rash, sleeping problems, sore throat, swelling of feet/legs, swollen lymph glands, thirst - excessive , urination - excessive , urination changes/pain, urine leakage, vision changes, and voice change.    Vital Signs:  Patient profile:   45  year old male Height:      75 inches Weight:      212.13 pounds BMI:     26.61 Temp:     97.6 degrees F oral Pulse rate:   80 / minute Pulse rhythm:   regular BP sitting:   112 / 72  (left arm) Cuff size:   regular  Vitals Entered By: Christie Nottingham CMA Duncan Dull) (January 14, 2010 10:51 AM)  Physical Exam  General:  Well developed, well nourished, no acute distress. Eyes:  Conjunctiva pink, no icterus.  Mouth:  Normal tongue, no deviation Lungs:  Clear throughout to auscultation. Heart:  Regular rate and rhythm; no murmurs, rubs,  or bruits. Abdomen:  Abdomen soft, nontender, nondistended. No obvious masses or hepatomegaly.Normal bowel sounds.  Msk:  Symmetrical with no gross deformities. Normal posture. Neurologic:  Alert and  oriented x4; loss of right nasolabial fold. Slight drooping of right side of mouth. Psych:  Alert and cooperative. Normal mood and affect.   Impression & Recommendations:  Problem # 1:  ABDOMINAL PAIN-EPIGASTRIC (ICD-789.06) Assessment New Burning in chest and epigastrium, started at 4am. Likely secondary to reflux / esophageal dysmotility (see #2). Continue twice daily PPI before meals. We discussed anti-reflux measures. Trial of liquid Carafate.   Problem # 2:  DYSPHAGIA (UEA-540.98) Assessment: Unchanged Initial visit with Dr. Arlyce Dice 12/31/09 for dysphagia. Noted to have loss of right nasolabial fold. EGD revealed a stricture at gastroesophageal junction which was dilated. Had follow up visit here on 01/06/10 for complaints of esophageal pain, persistent dysphagia. MRI of head done in ER several days earlier showed scattered foci of abnormal signal in white matter compatible with small vessel disease vrs. a demyelinating process. We referred patient to [facility] Neuro, saw by Dr.Yan. Workup is in progress but Neuro didn't feel dysphagia related to abnormal MRI. MRI of head with constrast has been scheduled. So far labs show normal : HTLV-I / II antibodies,  ANA, RPR, CRP, B12, ESR, HIV, Sjogren's Ab, TSH, VitD, 25 hydroxy. His ACE is low at 8. There is an apparent polyclonal gammopathy: IgA. Kappa and lambda typing appear increased. Patient tells me he just got phone call from Neurology reporting normal labs.   A recent MBS (done after EGD with dilation) suggested moderate oropharyngeal dysphagia (without penetration or aspiration of any consistency tested). Weakness appeared to be primary characteristic of dysphagia. Also a ? of esophageal dysphagia with appearance of variable amounts of residuals throughout esophagus.   Esophageal stricture was recently dilated. His persistent solid food dysphagia may be secondary to dysmotility, especially given recent neurological problems. Unfortunately the patient continues to lose weight. We could get an esophageal motility study but it probably makes more sense to await completion of his neuro evaluation.  Follow up with Dr. Arlyce Dice in three weeks.   Will be important to see what next MR shows as his symptoms do not appear to be from GERD/GI causes though functional process possible. Manometry could be helpful but would await repeat MR and Neuro follow-up. Iva Boop MD, [facility]  January 15, 2010 9:48 AM  Problem # 3:  THORACIC ANEURYSM WITHOUT MENTION OF RUPTURE (ICD-441.2) Assessment: Comment Only CTA of chest  in Oct. 2008 showed. enlargement of the ascending thoracic aorta measuring up to 4.1 cm consistent with aneurysmal formation.  This needs follow-up CT report of 2008 suggests that MR could be used to follow to reduce radiation dose Needs MR vs. CT of chest If thoracic aorta enlarging could possibly be related to dysphagia. Iva Boop MD, [facility]  January 15, 2010 9:52 AM   Patient Instructions: 1)  We sent a perscription for Carafate liquid to your pharmacy. 2)  Continue the Zegerid powder daily/  3)  GI Reflux brochure given.  4)  We made you an appointment with Dr. Arlyce Dice for 01-29-10 at 9:00  AM. 5)  Please call us in 7 days to  let us know how you are doing. 6)  Copy sent to : Crawford Givens, MD 7)  The medication list was reviewed and reconciled.  All changed / newly prescribed medications were explained.  A complete medication list was provided to the patient / caregiver. Prescriptions: CARAFATE 1 GM/10ML SUSP (SUCRALFATE) Take 1 GRAM 4 times daily  #120 grams x 1   Entered by:   Lowry Ram NCMA   Authorized by:   Willette Cluster NP   Signed by:   Lowry Ram NCMA on 01/14/2010   Method used:   [facility] to        [facility] [phone]* (retail)       [address]       [place], [place]  [address]       Ph: [phone] or [phone]       Fax: [phone]   RxID:   [id number]

## 2011-01-20 NOTE — Progress Notes (Signed)
  Phone Note Outgoing Call   Call placed by: Oda Cogan RN,  April 09, 2010 8:03 AM Summary of Call: On post op phone call, pt mentioned his left arm is slightly numb. He does admit to pre existing neck problems. Reassured and told Dr Arlyce Dice would be informed and if problem persists to call personal physician.  Initial call taken by: Oda Cogan RN,  April 09, 2010 8:06 AM

## 2011-01-20 NOTE — Letter (Signed)
Summary: Stacey Drain MD  Stacey Drain MD   Imported By: Sherian Rein 02/19/2010 12:20:50  _____________________________________________________________________  External Attachment:    Type:   Image     Comment:   External Document

## 2011-01-20 NOTE — Progress Notes (Signed)
Summary: ER over the weekend  Phone Note Call from Patient Call back at Home Phone [phone]   Call For: DR Arlyce Dice Summary of Call: Was in the emergency room over the weekend- is still not feeling good and was adviced to call office this morning. Initial call taken by: Leanor Kail [facility],  January 06, 2010 8:12 AM     Appended Document: ER over the weekend Pt. will see Amy Esterwood PAC at 1:30pm today.

## 2011-01-20 NOTE — Letter (Signed)
Summary: New Patient letter  [facility]  [address] [place], [place] [address]   Phone: [phone]  Fax: [phone]       12/23/2009 MRN: [id number]  [name] [address] [place], [place]  [address]  Dear Mr. Legrand,  Welcome to the Gastroenterology Division at [facility].    You are scheduled to see Dr. Jarold Motto on 1-20-11at 9:00a.m. on the 3rd floor at [facility], [address]  We ask that you try to arrive at our office 15 minutes prior to your appointment time to allow for check-in.  We would like you to complete the enclosed self-administered evaluation form prior to your visit and bring it with you on the day of your appointment.  We will review it with you.  Also, please bring a complete list of all your medications or, if you prefer, bring the medication bottles and we will list them.  Please bring your insurance card so that we may make a copy of it.  If your insurance requires a referral to see a specialist, please bring your referral form from your primary care physician.  Co-payments are due at the time of your visit and may be paid by cash, check or credit card.     Your office visit will consist of a consult with your physician (includes a physical exam), any laboratory testing he/she may order, scheduling of any necessary diagnostic testing (e.g. x-ray, ultrasound, CT-scan), and scheduling of a procedure (e.g. Endoscopy, Colonoscopy) if required.  Please allow enough time on your schedule to allow for any/all of these possibilities.    If you cannot keep your appointment, please call [phone] to cancel or reschedule prior to your appointment date.  This allows [place] the opportunity to schedule an appointment for another patient in need of care.  If you do not cancel or reschedule by 5 p.m. the business day prior to your appointment date, you will be charged a $50.00 late cancellation/no-show fee.    Thank you for choosing  [facility] for your medical needs.  We appreciate the opportunity to care for you.  Please visit [place] at our website  to learn more about our practice.                     Sincerely,                                                             The Gastroenterology Division

## 2011-01-20 NOTE — Letter (Signed)
Summary: Stacey Drain MD  Stacey Drain MD   Imported By: Sherian Rein 02/19/2010 12:22:01  _____________________________________________________________________  External Attachment:    Type:   Image     Comment:   External Document

## 2011-01-20 NOTE — Procedures (Signed)
Summary: ENDO/Balloon Dil/[facility]  ENDO/Balloon Dil/[place] Laurette Schimke   Imported By: Lester  01/02/2010 12:43:33  _____________________________________________________________________  External Attachment:    Type:   Image     Comment:   External Document

## 2011-01-20 NOTE — Progress Notes (Signed)
Summary: Condition Update  Phone Note Call from Patient Call back at Home Phone [phone]   Caller: Patient Call For: Arlyce Dice Reason for Call: Talk to Nurse Summary of Call: Patient was told to call us if Dexilant did not work, Patient states that he has tried it like Dr Arlyce Dice told him to. Patient wants to know what's the next step. Initial call taken by: Tawni Levy,  Apr 29, 2010 9:56 AM  Follow-up for Phone Call        Has been taking Dexilant two times a day, stopped Carafate.  Some  improvement, but continues with burning in his stomache.   [facility]LEASE ADV[facility]  Follow-up by: Laureen Ochs LPN,  Apr 29, 2010 10:37 AM  Additional Follow-up for Phone Call Additional follow up Details #1::        hyomax 0.375mg  two times a day c/b next week Additional Follow-up by: Louis Meckel MD,  Apr 29, 2010 11:15 AM    Additional Follow-up for Phone Call Additional follow up Details #2::    Above MD orders reviewed with patient. Med to his pharmacy. Pt. instructed to call back as needed.  Follow-up by: Laureen Ochs LPN,  Apr 29, 2010 11:17 AM  New/Updated Medications: HYOSCYAMINE SULFATE CR 0.375 MG  TB12 (HYOSCYAMINE SULFATE) Take 1 twice daily Prescriptions: HYOSCYAMINE SULFATE CR 0.375 MG  TB12 (HYOSCYAMINE SULFATE) Take 1 twice daily  #60 x 2   Entered by:   Laureen Ochs LPN   Authorized by:   Louis Meckel MD   Signed by:   Laureen Ochs LPN on 09/81/1914   Method used:   [facility]        [facility] [phone]* (retail)       [address]       [place], [place]  [address]       Ph: [phone] or [phone]       Fax: [phone]   RxID:   [id number]

## 2011-01-20 NOTE — Progress Notes (Signed)
Summary: Stomach is burning  Phone Note Call from Patient Call back at Home Phone [phone]   Call For: Dr Arlyce Dice Summary of Call: His stomach is burning and feels really cold. Initial call taken by: Leanor Kail [facility],  January 14, 2010 9:30 AM  Follow-up for Phone Call        Pt. states he woke up at 4am this morning and his stomache was burning, it still burns, even after he has taken his Zegerid and ate breakfast. He is also feeling very cold, temp. is 95.6. Pt. wants to be seen today. He will see Willette Cluster NP at 10:30am. Follow-up by: Laureen Ochs LPN,  January 14, 2010 9:45 AM  Additional Follow-up for Phone Call Additional follow up Details #1::

## 2011-01-20 NOTE — Progress Notes (Signed)
Summary: Endo today-throat hurts  Phone Note Call from Patient   Caller: Towana Badger [phone] Call For: Dr Arlyce Dice Summary of Call: Had Endo at the hospital this morning and doesnt feel well at all. His throat hurts. Initial call taken by: Leanor Kail [facility],  January 02, 2010 3:11 PM  Follow-up for Phone Call        Message left for patient to callback.Laureen Ochs LPN  January 02, 2010 3:20 PM Message left for patient to callback.Laureen Ochs LPN  January 02, 2010 4:45 PM  (See if pt. has ever been on another PPI, Omeprazole. Robin needs info. to get Nexium Prior-authed)  Patient is a t his Mom's number above now. Can nurse call back today please? Follow-up by: Leanor Kail [facility],  January 03, 2010 9:10 AM  Additional Follow-up for Phone Call Additional follow up Details #1::        Pt. did not return call so I called him and he says he is better today but still has some pain a 4-5 which was a 9-10 yesterday that is in epigastric area and radiates up under his chin to his ears.Instructed to use warm gargles,Tylenol and stay on clear liquids todayt and advance diet tomorrow.Told I will send to Dr.Jacobs who is Dr.of the day and call him back if he has any further orders. Additional Follow-up by: Teryl Lucy RN,  January 03, 2010 1:03 PM    Additional Follow-up for Phone Call Additional follow up Details #2::    i agree, let him know I reviewed this.  If pain worsens at all, he should call back.  Also ask about fevers, chills, swallowing troubles. Follow-up by: Rachael Fee MD,  January 03, 2010 1:11 PM  Additional Follow-up for Phone Call Additional follow up Details #3:: Details for Additional Follow-up Action Taken: Pt. contacted and he denies fever,chills or dysphagia with liquids. Given instructions from Dr.Jacobs on what to do if pain worsens. Sore throat is improving as of this phone call. Additional Follow-up by: Teryl Lucy RN,  January 03, 2010 3:09 PM

## 2011-01-20 NOTE — Assessment & Plan Note (Signed)
Summary: C/O PAIN AFTER ENDO/DIL. AND ER VISIT    (DR.KAPLAN PT.)  DEB...   History of Present Illness Visit Type: Follow-up Visit Primary GI MD: Melvia Heaps MD [facility] Primary Provider: Crawford Givens, MD Requesting Provider: n/a Chief Complaint: Pt had EGD with dil last Thursday and immediatly afterwards pt starting having some esophageal pain and discomfort. Pt has been on clear liquids but tried eating a pancake on Sunday that did get stuck and was very painful moving down his esophagus. Pt feels a constant lump in throat and esophageal discomfort. Pt did attempt to eat on Saturday as well and vomited it back up. History of Present Illness:   45 YO MALE KNOWN TO DR KAPLAN WHO WAS INITIALLY SEEN 12/31/08. HE PRESENTED WITH NEW ONSET OF DYSPHAGIA X 3-4 WEEKS. HE C/O DIFFICULTY WITH LIQUIDS AND SOLIDS,DOES BEST WITH SOUP /FULL LIQUIDS. HE HAS A SENSATION OF FULLNESS ON THE THROAT. HE ALSO C/O SORENESS IN ANTERIOI NECK AND INTERMITTENT PAINS POSTERIOIRLY IN HIS NECK.HE HAS A NUMB,PAINFUL FEELING IN THE CHIN AND SUMANDIBULAR AREA,SXS ARE BILATERAL BY HIS DESCRIPTION. HE HAS HAD SOME ASSOCIATED HEADACHES,FATIGUE ANS HAS LOST AT LEAST 15 POUNDS AS HE CANNOT GET MUCH DOWN. HE HAS A FACIAL ASYMMETRY WITH LOSS OF NASOLABIAL FOLD ON THE RIGHT. HE IS NOT SURE WHEN HE FIRST NOTICED THIS. HAS HAD NEGATIVE ENT EVALUATION. EGD WITH DR Arlyce Dice LAST WEEK SHOWED AN EARLY DISTAL STRICTURE WHICH WHICH WAS MALONEY DILATED BUT DID NOT CHANGE ANY OF HIS SXS. MRA OF HEAD WAS DONE THRU THE ER 12/28/09 SHOWING SCATTERED FOCI OF ABNORMAL SIGNAL IN WHITE MATTER C/W SMALL VESSEL DISEASE VS A DEMYELINATING PROCESS.   GI Review of Systems    Reports belching, dysphagia with liquids, dysphagia with solids, nausea, vomiting, and  weight loss.   Weight loss of 15 pounds over 6 WEEKS.   Denies abdominal pain, acid reflux, bloating, chest pain, heartburn, loss of appetite, vomiting blood, and  weight gain.        Denies anal fissure,  black tarry stools, change in bowel habit, constipation, diarrhea, diverticulosis, fecal incontinence, heme positive stool, hemorrhoids, irritable bowel syndrome, jaundice, light color stool, liver problems, rectal bleeding, and  rectal pain.    Current Medications (verified): 1)  Aspir-Low 81 Mg Tbec (Aspirin) .... One Tablet By Mouth Once Daily 2)  Metoprolol Tartrate 25 Mg Tabs (Metoprolol Tartrate) .... One Tablet By Mouth Once Daily 3)  Levothyroxine Sodium 112 Mcg Tabs (Levothyroxine Sodium) .... 1/2 Pill Twice A Week and Then One Whole Tablet By Mouth For Five Days 4)  Lisinopril-Hydrochlorothiazide 20-12.5 Mg Tabs (Lisinopril-Hydrochlorothiazide) .... One Tablet By Mouth Once Daily 5)  Omeprazole 40 Mg Cpdr (Omeprazole) .Marland Kitchen.. 1 By Mouth Once Daily  Allergies (verified): 1)  ! Codeine  Past History:  Past Medical History: Reviewed history from 12/31/2009 and no changes required. Hypertension Thyroid Disease  GERD  Past Surgical History: Reviewed history from 12/31/2009 and no changes required. Left Elbow Surgery   Family History: Reviewed history from 12/31/2009 and no changes required. No FH of Colon Cancer: Family History of Clotting disorder: Mother  Family History of Heart Disease: Father   Social History: Reviewed history from 12/31/2009 and no changes required. 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  Alcohol Use - no Daily Caffeine Use: 2 daily  Illicit Drug Use - no  Review of Systems  The patient denies allergy/sinus, anemia, anxiety-new, arthritis/joint pain, back pain, blood in urine, breast changes/lumps, change in vision, confusion, cough,  coughing up blood, depression-new, fainting, fatigue, fever, headaches-new, hearing problems, heart murmur, heart rhythm changes, itching, menstrual pain, muscle pains/cramps, night sweats, nosebleeds, pregnancy symptoms, shortness of breath, skin rash, sleeping problems, sore throat, swelling of  feet/legs, swollen lymph glands, thirst - excessive , urination - excessive , urination changes/pain, urine leakage, vision changes, and voice change.         ROS OTHERWISE AS IN HPI  Vital Signs:  Patient profile:   45 year old male Height:      75 inches Weight:      215.13 pounds BMI:     26.99 Pulse rate:   80 / minute Pulse rhythm:   regular BP sitting:   126 / 78  (left arm) Cuff size:   regular  Vitals Entered By: Christie Nottingham CMA Duncan Dull) (January 06, 2010 1:39 PM)  Physical Exam  General:  Well developed, well nourished, no acute distress. Head:  Normocephalic and atraumatic. Eyes:  PERRLA, no icterus. Mouth:  MOUTH ASYMMETRIC,FLATTENED FOLD ON RIGHT Neck:  TENDER OVER THYROID AND ANT NECK Lungs:  Clear throughout to auscultation. Heart:  Regular rate and rhythm; no murmurs, rubs,  or bruits. Abdomen:  SOFT, NONTENDER, NO MASS OR HSM,BS+ Rectal:  NOT DONE Extremities:  No clubbing, cyanosis, edema or deformities noted. Neurologic:  Alert and  oriented x4;  grossly normal neurologically. Psych:  Alert and cooperative. Normal mood and affect.   Impression & Recommendations:  Problem # 1:  DYSPHAGIA OROPHARYNGEAL PHASE (ICD-787.22) Assessment Deteriorated 45 YO MALE WITH NEW ONSET OF DYSPHAGIA PAST 6 WEEKS. SUSPECT THIS IS NEUROGENIC R/O DEMYELINATING DISEASE PER ABNORMAL MRI HEAD. MILD DISTAL ESOPHAGEAL NOT CAUSING HIS SXS.PT WITH ASSOCIATED SIGNIFICANT WEIGHT LOSS  PUSH CALORIES,ADD PROTEIN SHAKES OR SUPPLEMENTS,FULL LIQUID DIET MODIFIED SWALLOW EVAL WITH SPEECH PATH HAVE CALLED NEUROLGY TO GET APPT MOVED UP FOR LATER THIS WEEK.  Problem # 2:  HYPERTENSION (ICD-401.9) Assessment: Comment Only  Problem # 3:  HYPOTHYROIDISM (ICD-244.9) Assessment: Comment Only  Other Orders: Barium Swallow, Modified  (Modified BS)  Patient Instructions: 1)  We did schedule  the Barium Swallow test for tomorrow 01-07-10 at 11:00 Am.  2)  Location for the test is [facility], go to admitting and register on the 1st floor. 3)  We did get your appointment with Dr. Noel Christmas moved up to 01-09-10 at 11:45 Am.  4)  Copy sent to : Crawford Givens, MD 5)                          Noel Christmas, MD 6)  The medication list was reviewed and reconciled.  All changed / newly prescribed medications were explained.  A complete medication list was provided to the patient / caregiver.

## 2011-01-20 NOTE — Progress Notes (Signed)
Summary: Medication Refill  Phone Note From Pharmacy   Caller: [facility] [phone] Call For: Dr. Arlyce Dice  Reason for Call: Needs renewal Summary of Call: Needs prior approval on his Nexium Initial call taken by: Karna Christmas,  January 03, 2010 11:00 AM  Follow-up for Phone Call        Will send in Omeprazole for pt. Nexium not covered Follow-up by: Merri Ray CMA Duncan Dull),  January 03, 2010 12:59 PM    New/Updated Medications: OMEPRAZOLE 40 MG CPDR (OMEPRAZOLE) 1 by mouth once daily Prescriptions: OMEPRAZOLE 40 MG CPDR (OMEPRAZOLE) 1 by mouth once daily  #30 x 3   Entered by:   Merri Ray CMA (AAMA)   Authorized by:   Louis Meckel MD   Signed by:   Merri Ray CMA (AAMA) on 01/03/2010   Method used:   Electronically to        [facility] [phone]* (retail)       [address]       [place], [place]  [address]       Ph: [phone] or [phone]       Fax: [phone]   RxID:   [id number]

## 2011-01-20 NOTE — Letter (Signed)
Summary: Appt Reminder 2  [facility]  [address] [place], [place] [address]   Phone: [phone]  Fax: [phone]        April 09, 2010 MRN: [id number]    [name] [address] [place], [place]  [address]    Dear Mr. Neale,   You have a return appointment with Dr.Robert Arlyce Dice on 04-23-10 at 9:30am. Please remember to bring a complete list of the medicines you are taking, your insurance card and your co-pay.  If you have to cancel or reschedule this appointment, please call before 5:00 pm the evening before to avoid a cancellation fee.  If you have any questions or concerns, please call [phone].    Sincerely,    Laureen Ochs LPN  Appended Document: Appt Reminder 2 Letter mailed to patient.

## 2011-03-08 LAB — BASIC METABOLIC PANEL
CO2: 28 mEq/L (ref 19–32)
Calcium: 9.1 mg/dL (ref 8.4–10.5)
Chloride: 98 mEq/L (ref 96–112)
GFR calc Af Amer: >60 mL/min (ref >60)
Potassium: 3.1 mEq/L — ABNORMAL LOW (ref 3.5–5.1)
Sodium: 137 mEq/L (ref 135–145)

## 2011-03-08 LAB — POCT CARDIAC MARKERS
CKMB, poc: 1 ng/mL (ref 1.0–8.0)
Troponin i, poc: <0.05 ng/mL (ref 0.00–0.09)

## 2011-03-08 LAB — CBC
HCT: 51.1 % (ref 39.0–52.0)
Hemoglobin: 17.9 g/dL — ABNORMAL HIGH (ref 13.0–17.0)
MCHC: 35 g/dL (ref 30.0–36.0)
RBC: 5.23 MIL/uL (ref 4.22–5.81)

## 2011-03-08 LAB — DIFFERENTIAL
Basophils Relative: 0 % (ref 0–1)
Monocytes Absolute: 0.7 10*3/uL (ref 0.1–1.0)
Monocytes Relative: 9 % (ref 3–12)
Neutro Abs: 5.3 10*3/uL (ref 1.7–7.7)

## 2011-03-09 LAB — BASIC METABOLIC PANEL
CO2: 30 mEq/L (ref 19–32)
Calcium: 9.1 mg/dL (ref 8.4–10.5)
Chloride: 105 mEq/L (ref 96–112)
Creatinine, Ser: 0.88 mg/dL (ref 0.4–1.5)
GFR calc Af Amer: >60 mL/min (ref >60)
GFR calc non Af Amer: >60 mL/min (ref >60)
Glucose, Bld: 97 mg/dL (ref 70–99)
Potassium: 3.5 mEq/L (ref 3.5–5.1)
Sodium: 142 mEq/L (ref 135–145)
Sodium: 143 mEq/L (ref 135–145)

## 2011-03-09 LAB — COMPREHENSIVE METABOLIC PANEL
ALT: 21 U/L (ref 0–53)
AST: 23 U/L (ref 0–37)
Alkaline Phosphatase: 37 U/L — ABNORMAL LOW (ref 39–117)
BUN: 7 mg/dL (ref 6–23)
CO2: 30 mEq/L (ref 19–32)
CO2: 33 mEq/L — ABNORMAL HIGH (ref 19–32)
Calcium: 9.1 mg/dL (ref 8.4–10.5)
Calcium: 9.4 mg/dL (ref 8.4–10.5)
Chloride: 101 mEq/L (ref 96–112)
GFR calc Af Amer: >60 mL/min (ref >60)
GFR calc non Af Amer: >60 mL/min (ref >60)
GFR calc non Af Amer: >60 mL/min (ref >60)
Glucose, Bld: 93 mg/dL (ref 70–99)
Potassium: 3.1 mEq/L — ABNORMAL LOW (ref 3.5–5.1)
Potassium: 3.3 mEq/L — ABNORMAL LOW (ref 3.5–5.1)
Sodium: 141 mEq/L (ref 135–145)
Total Bilirubin: 0.9 mg/dL (ref 0.3–1.2)
Total Protein: 6.2 g/dL (ref 6.0–8.3)

## 2011-03-09 LAB — RENAL FUNCTION PANEL
Albumin: 3.4 g/dL — ABNORMAL LOW (ref 3.5–5.2)
GFR calc Af Amer: >60 mL/min (ref >60)
Phosphorus: 4.5 mg/dL (ref 2.3–4.6)
Potassium: 3.5 mEq/L (ref 3.5–5.1)
Sodium: 140 mEq/L (ref 135–145)

## 2011-03-09 LAB — HEMOGLOBIN A1C: Mean Plasma Glucose: 105 mg/dL

## 2011-03-09 LAB — ANTITHROMBIN III: AntiThromb III Func: 116 % (ref 76–126)

## 2011-03-09 LAB — LUPUS ANTICOAGULANT PANEL
DRVVT: 42.8 secs (ref 36.2–46.0)
Lupus Anticoagulant: NOT DETECTED
dRVVT Incubated 1:1 Mix: 39.7 secs (ref 36.1–47.0)

## 2011-03-09 LAB — CBC
HCT: 42 % (ref 39.0–52.0)
Hemoglobin: 14.1 g/dL (ref 13.0–17.0)
MCHC: 33.5 g/dL (ref 30.0–36.0)
RBC: 4.25 MIL/uL (ref 4.22–5.81)
RBC: 4.71 MIL/uL (ref 4.22–5.81)
RDW: 13.1 % (ref 11.5–15.5)
WBC: 4.6 10*3/uL (ref 4.0–10.5)

## 2011-03-09 LAB — ANA: Anti Nuclear Antibody(ANA): NEGATIVE

## 2011-03-09 LAB — CARDIOLIPIN ANTIBODIES, IGG, IGM, IGA: Anticardiolipin IgA: <3 APL U/mL — ABNORMAL LOW (ref <10)

## 2011-03-09 LAB — PROTIME-INR
INR: 1.1 (ref 0.00–1.49)
Prothrombin Time: 14.1 seconds (ref 11.6–15.2)

## 2011-03-09 LAB — PROTEIN S ACTIVITY: Protein S Activity: 108 % (ref 69–129)

## 2011-03-09 LAB — FACTOR 5 LEIDEN

## 2011-03-09 LAB — MAGNESIUM: Magnesium: 2.3 mg/dL (ref 1.5–2.5)

## 2011-03-09 LAB — CENTROMERE ANTIBODIES: Centromere Ab Screen: <0.2 AI (ref <1.0)

## 2011-03-09 LAB — BETA-2-GLYCOPROTEIN I ABS, IGG/M/A: Beta-2 Glyco I IgG: <3 U/mL (ref <=15)

## 2011-03-10 LAB — BENZODIAZEPINE, QUANTITATIVE, URINE
Nordiazepam GC/MS Conf: NEGATIVE NG/ML
Oxazepam GC/MS Conf: NEGATIVE NG/ML
Temazepam GC/MS Conf: NEGATIVE NG/ML

## 2011-03-10 LAB — COMPREHENSIVE METABOLIC PANEL
AST: 24 U/L (ref 0–37)
Albumin: 4 g/dL (ref 3.5–5.2)
BUN: 6 mg/dL (ref 6–23)
Calcium: 8.9 mg/dL (ref 8.4–10.5)
Creatinine, Ser: 0.77 mg/dL (ref 0.4–1.5)
GFR calc Af Amer: >60 mL/min (ref >60)
Total Protein: 6.9 g/dL (ref 6.0–8.3)

## 2011-03-10 LAB — URINALYSIS, ROUTINE W REFLEX MICROSCOPIC
Bilirubin Urine: NEGATIVE
Ketones, ur: NEGATIVE mg/dL
Leukocytes, UA: NEGATIVE
Nitrite: NEGATIVE
Specific Gravity, Urine: 1.011 (ref 1.005–1.030)
Urobilinogen, UA: 0.2 mg/dL (ref 0.0–1.0)
pH: 7.5 (ref 5.0–8.0)

## 2011-03-10 LAB — CBC
HCT: 43.4 % (ref 39.0–52.0)
MCHC: 35.4 g/dL (ref 30.0–36.0)
MCV: 97.9 fL (ref 78.0–100.0)
Platelets: 198 10*3/uL (ref 150–400)
RDW: 13.2 % (ref 11.5–15.5)
WBC: 6.2 10*3/uL (ref 4.0–10.5)

## 2011-03-10 LAB — DIFFERENTIAL
Basophils Absolute: 0 10*3/uL (ref 0.0–0.1)
Eosinophils Relative: 1 % (ref 0–5)
Lymphocytes Relative: 25 % (ref 12–46)
Lymphs Abs: 1.5 10*3/uL (ref 0.7–4.0)
Monocytes Absolute: 0.8 10*3/uL (ref 0.1–1.0)
Neutro Abs: 3.8 10*3/uL (ref 1.7–7.7)

## 2011-03-10 LAB — DRUGS OF ABUSE SCREEN W/O ALC, ROUTINE URINE
Methadone: NEGATIVE
Opiate Screen, Urine: NEGATIVE
Phencyclidine (PCP): NEGATIVE
Propoxyphene: NEGATIVE

## 2011-03-10 LAB — URINE MICROSCOPIC-ADD ON

## 2011-03-10 LAB — APTT: aPTT: 38 seconds — ABNORMAL HIGH (ref 24–37)

## 2011-03-10 LAB — LIPID PANEL
Cholesterol: 119 mg/dL (ref 0–200)
HDL: 32 mg/dL — ABNORMAL LOW (ref >39)
LDL Cholesterol: 73 mg/dL (ref 0–99)
Triglycerides: 69 mg/dL (ref <150)

## 2011-03-10 LAB — POCT CARDIAC MARKERS
CKMB, poc: <1 ng/mL — ABNORMAL LOW (ref 1.0–8.0)
Troponin i, poc: <0.05 ng/mL (ref 0.00–0.09)

## 2011-03-10 LAB — PHOSPHORUS: Phosphorus: 2.5 mg/dL (ref 2.3–4.6)

## 2011-03-10 LAB — TSH: TSH: 2.158 u[IU]/mL (ref 0.350–4.500)

## 2011-03-10 LAB — VITAMIN B12: Vitamin B-12: 501 pg/mL (ref 211–911)

## 2011-03-10 LAB — PROTIME-INR: INR: 2.95 — ABNORMAL HIGH (ref 0.00–1.49)

## 2011-03-12 ENCOUNTER — Encounter (HOSPITAL_BASED_OUTPATIENT_CLINIC_OR_DEPARTMENT_OTHER): Payer: BC Managed Care – PPO | Admitting: Oncology

## 2011-03-12 DIAGNOSIS — I749 Embolism and thrombosis of unspecified artery: Secondary | ICD-10-CM

## 2011-03-12 DIAGNOSIS — Z7901 Long term (current) use of anticoagulants: Secondary | ICD-10-CM

## 2011-03-12 DIAGNOSIS — D6859 Other primary thrombophilia: Secondary | ICD-10-CM

## 2011-03-12 DIAGNOSIS — I2699 Other pulmonary embolism without acute cor pulmonale: Secondary | ICD-10-CM

## 2011-03-15 LAB — DIFFERENTIAL
Basophils Absolute: 0 10*3/uL (ref 0.0–0.1)
Basophils Relative: 0 % (ref 0–1)
Basophils Relative: 1 % (ref 0–1)
Eosinophils Absolute: 0.2 10*3/uL (ref 0.0–0.7)
Eosinophils Absolute: 0.2 10*3/uL (ref 0.0–0.7)
Eosinophils Relative: 1 % (ref 0–5)
Lymphocytes Relative: 14 % (ref 12–46)
Lymphocytes Relative: 21 % (ref 12–46)
Lymphocytes Relative: 32 % (ref 12–46)
Lymphs Abs: 1.3 10*3/uL (ref 0.7–4.0)
Lymphs Abs: 1.9 10*3/uL (ref 0.7–4.0)
Monocytes Absolute: 0.5 10*3/uL (ref 0.1–1.0)
Monocytes Absolute: 0.6 10*3/uL (ref 0.1–1.0)
Monocytes Relative: 11 % (ref 3–12)
Monocytes Relative: 6 % (ref 3–12)
Monocytes Relative: 9 % (ref 3–12)
Neutro Abs: 6.9 10*3/uL (ref 1.7–7.7)
Neutrophils Relative %: 54 % (ref 43–77)
Neutrophils Relative %: 69 % (ref 43–77)

## 2011-03-15 LAB — BASIC METABOLIC PANEL
BUN: 8 mg/dL (ref 6–23)
CO2: 25 mEq/L (ref 19–32)
CO2: 27 mEq/L (ref 19–32)
CO2: 29 mEq/L (ref 19–32)
Calcium: 9 mg/dL (ref 8.4–10.5)
Chloride: 108 mEq/L (ref 96–112)
Creatinine, Ser: 0.73 mg/dL (ref 0.4–1.5)
GFR calc Af Amer: >60 mL/min (ref >60)
GFR calc Af Amer: >60 mL/min (ref >60)
GFR calc Af Amer: >60 mL/min (ref >60)
GFR calc non Af Amer: >60 mL/min (ref >60)
Glucose, Bld: 103 mg/dL — ABNORMAL HIGH (ref 70–99)
Glucose, Bld: 97 mg/dL (ref 70–99)
Potassium: 2.7 mEq/L — CL (ref 3.5–5.1)
Potassium: 3.1 mEq/L — ABNORMAL LOW (ref 3.5–5.1)
Potassium: 3.5 mEq/L (ref 3.5–5.1)
Sodium: 140 mEq/L (ref 135–145)
Sodium: 141 mEq/L (ref 135–145)
Sodium: 141 mEq/L (ref 135–145)
Sodium: 142 mEq/L (ref 135–145)

## 2011-03-15 LAB — CBC
HCT: 40.6 % (ref 39.0–52.0)
HCT: 46.1 % (ref 39.0–52.0)
Hemoglobin: 13.5 g/dL (ref 13.0–17.0)
Hemoglobin: 15.8 g/dL (ref 13.0–17.0)
MCHC: 34.4 g/dL (ref 30.0–36.0)
MCHC: 34.7 g/dL (ref 30.0–36.0)
MCV: 98.9 fL (ref 78.0–100.0)
MCV: 99.9 fL (ref 78.0–100.0)
Platelets: 141 10*3/uL — ABNORMAL LOW (ref 150–400)
Platelets: 153 10*3/uL (ref 150–400)
Platelets: 176 10*3/uL (ref 150–400)
RBC: 3.94 MIL/uL — ABNORMAL LOW (ref 4.22–5.81)
RBC: 4.13 MIL/uL — ABNORMAL LOW (ref 4.22–5.81)
RBC: 4.66 MIL/uL (ref 4.22–5.81)
RBC: 4.66 MIL/uL (ref 4.22–5.81)
RDW: 13 % (ref 11.5–15.5)
WBC: 6 10*3/uL (ref 4.0–10.5)
WBC: 6.3 10*3/uL (ref 4.0–10.5)
WBC: 7.3 10*3/uL (ref 4.0–10.5)

## 2011-03-15 LAB — POCT CARDIAC MARKERS
Myoglobin, poc: 62.3 ng/mL (ref 12–200)
Troponin i, poc: <0.05 ng/mL (ref 0.00–0.09)

## 2011-03-15 LAB — POCT I-STAT, CHEM 8
Chloride: 100 mEq/L (ref 96–112)
HCT: 47 % (ref 39.0–52.0)
Hemoglobin: 16 g/dL (ref 13.0–17.0)
Potassium: 3.7 mEq/L (ref 3.5–5.1)
Sodium: 141 mEq/L (ref 135–145)

## 2011-03-15 LAB — APTT
aPTT: 34 seconds (ref 24–37)
aPTT: 38 seconds — ABNORMAL HIGH (ref 24–37)

## 2011-03-15 LAB — PROTIME-INR
INR: 2.52 — ABNORMAL HIGH (ref 0.00–1.49)
Prothrombin Time: 27 seconds — ABNORMAL HIGH (ref 11.6–15.2)

## 2011-03-15 LAB — MAGNESIUM: Magnesium: 2.1 mg/dL (ref 1.5–2.5)

## 2011-03-15 LAB — PHOSPHORUS: Phosphorus: 3.2 mg/dL (ref 2.3–4.6)

## 2011-03-15 LAB — TYPE AND SCREEN: ABO/RH(D): B POS

## 2011-03-15 LAB — TSH: TSH: 3.571 u[IU]/mL (ref 0.350–4.500)

## 2011-05-05 NOTE — Discharge Summary (Signed)
[name], [name]             ACCOUNT NO.:  [id number]   MEDICAL RECORD NO.:  [id number]          PATIENT TYPE:  OBV   LOCATION:  3701                         FACILITY:  [facility]   PHYSICIAN:  Michiel Cowboy, MDDATE OF BIRTH:  [birth year]   DATE OF ADMISSION:  10/09/2007  DATE OF DISCHARGE:  10/11/2007                               DISCHARGE SUMMARY   DISCHARGE DIAGNOSES:  1. Aortic root dilatation of aortic cusp.  2. Hypertension.  3. Hypothyroidism.   HISTORY AND PHYSICAL:  Please see full H&P for full story, but, shortly,  this is a 45 year old gentleman who presented with chest pain.  He was  evaluated for MI and ruled out with negative cardiac enzymes x3 and  normal EKGs.  A CT scan was performed to rule out PE and showed dilated  ascending aorta, at which point an echogram was ordered.  Echogram  showed a questionable finding which could not rule out completely  ventricular shunt or a very small aortic dissection, at which point a CT  over heart was performed which showed a slightly enlarged pocket behind  one of the aortic valve cusps.  The implication is that this can  potentially dilate at this time, but currently it is stable.  Not sure  if this is actually patient's symptoms or if this is an unrelated  finding.  Nevertheless, the patient was started on Toprol and beta  blocker in addition to his prior lisinopril, and patient will have to  have follow up with either an echogram or repeat CAT scans of his chest.   FOLLOWUP INSTRUCTIONS:  The patient to follow up with Dr. Vedia Coffer, his  primary care [facility], in 1-2 weeks after his discharge, and, also, the  patient is to follow up with Dr. Eldridge Dace within 1 month.   DISCHARGE MEDICATIONS:  1. Lisinopril and hydrochlorothiazide 10/12.5 mg p.o. daily.  2. Toprol 25 mg p.o. daily.  3. Synthroid 88 mcg p.o. daily.  4. aspirin 81mg  po daily      Michiel Cowboy, MD  Electronically Signed     AVD/MEDQ  D:   10/11/2007  T:  10/12/2007  Job:  [id number]   cc:   Corky Crafts, MD  Cassell Clement, M.D.

## 2011-05-05 NOTE — H&P (Signed)
[name], [name]             ACCOUNT NO.:  [id number]   MEDICAL RECORD NO.:  [id number]          PATIENT TYPE:  OBV   LOCATION:  3701                         FACILITY:  [facility]   PHYSICIAN:  Michiel Cowboy, MDDATE OF BIRTH:  [birth year]   DATE OF ADMISSION:  10/09/2007  DATE OF DISCHARGE:                              HISTORY & PHYSICAL   CHIEF COMPLAINT:  Chest pain.   PRIMARY CARE PHYSICIAN:  Dr. Vedia Coffer   HISTORY OF PRESENT ILLNESS:  This is a 45 year old gentleman with  history of hypertension, hypothyroidism, recent elbow operation for  tennis elbow.  The patient woke up this morning with a mild headache and  tightness in his mid sternum.  This was not associated with diaphoresis,  nausea, vomiting, though slight shortness of breath.  The symptoms  lasted for about 2 hours and then EMS was called and patient was taken  to [facility].  While in EMS, the patient did have slight left arm pain  as well.  His symptoms improved by aspirin and nitroglycerin.   Of note, the patient denies any recent long car rides.  Denies being  sedentary.  The patient reports that the pain is worse with breathing,  it is not pleuritic in nature, but denies change in position.  Denies  radiation.  Describes pain as holding 2 fingers over midsternum.   PAST MEDICAL HISTORY:  Significant for:  1. Hypertension.  2. Hypothyroidism.   SOCIAL HISTORY:  Denies tobacco, alcohol, or drugs.  Works in Engineering geologist.   FAMILY HISTORY:  Significant for father with coronary artery disease in  his 63s and mother with Factor V Leiden mutation as well as sister with  stroke in her early 38s and distant relatives with blood clots.   ALLERGIES:  NO KNOWN DRUG ALLERGIES.   MEDICATIONS:  1. Lisinopril & hydrochlorothiazide 20/12.5 mg p.o. once daily.  2. Synthroid.  The patient unsure about the dose.   REVIEW OF SYSTEMS:  Denies fever, chills, night sweats.  Had mild  headache.  No nasal congestion.  Has a rash  that has been there for  months.  Reports chest pain.  No shortness of breath, no dyspnea on  exertion.  No nausea, vomiting.  Reports occasional diarrhea.   PHYSICAL EXAMINATION:  VITAL SIGNS:  Temperature 97.5, heart rate 69,  respirations 18, blood pressure 134/89.  GENERAL:  Pleasant male in no acute distress.  HEENT:  Moist mucous membranes.  NECK:  Supple.  No lymphadenopathy.  HEART:  Regular rate and rhythm.  No murmurs, rubs, or gallops.  LUNGS:  Clear to auscultation bilaterally.  SKIN:  Many sun spots on the back.  ABDOMEN:  Soft, nontender, nondistended.  EXTREMITIES:  No clubbing, cyanosis, or edema.  NEUROLOGICAL:  Nonfocal.   RADIOLOGICAL STUDIES:  1. A CT scan showing no PE, but a 4 cm dilatation on the proximal      aorta consistent with aneurysm.  2. EKG, heart rate 58, QTC 381.  No ST elevation or depression.  No Q-      waves noted.  Otherwise, normal EKG.   LABORATORY DATA:  White blood cell count 8.3, H&H 16.2/48.1.  Potassium  3.8, creatinine 0.9, BUN 15.  Cardiac enzymes within normal limits.  INR  1.0, PTT 30.   ASSESSMENT AND PLAN:  This is a 45 year old gentleman with chest pain  fairly atypical on presentation with past medical history significant  for hypertension and family history of heart disease and Factor V  Leiden.  1. Chest pain:  Differential includes esophageal spasm, anxiety, MI      less likely and PE unlikely given negative CT scan.  No evidence of      deep venous thrombosis and no tachycardia.  The patient is      saturating 98% on room air.  We will cycle cardiac enzymes and EKG      report on telemetry.  Obtain TSH, hemoglobin, and lipid panel.  2. Hypertension:  We will continue home medications.  3. Proximal aortic aneurysm:  We will obtain echogram to evaluate this      further.  The patient will likely need outpatient followup for this      in the future.  No evidence of dissection per CT scan.  4. Hypothyroidism:  We will  continue Synthroid once known of dose.  5. Prophylaxis:  Lovenox and Protonix.  6. Code status:  Full code.      Michiel Cowboy, MD  Electronically Signed     AVD/MEDQ  D:  10/09/2007  T:  10/10/2007  Job:  [id number]

## 2011-05-05 NOTE — Consult Note (Signed)
[name], [name]             ACCOUNT NO.:  [id number]   MEDICAL RECORD NO.:  [id number]          PATIENT TYPE:  OBV   LOCATION:  3701                         FACILITY:  [facility]   PHYSICIAN:  Corky Crafts, MDDATE OF BIRTH:  07-13-1966   DATE OF CONSULTATION:  10/11/2007  DATE OF DISCHARGE:                                 CONSULTATION   PRIMARY CARE PHYSICIAN:  Erin E Black, D.O.   REASON FOR CONSULTATION:  Abnormal echocardiogram, chest pain.   HISTORY OF PRESENT ILLNESS:  The patient is a 45 year old with no known  history of coronary artery disease who awoke two days ago with shortness  of breath that got better with walking.  He called EMS and felt some  chest pain en route, but this pain spontaneously went away.  He states  that the chest pain was worse with deep breathing.  He underwent enzymes  which were negative.  He underwent an echocardiogram which showed an  abnormal flow pattern in the ascending aorta.  There was some parallel  flow noted behind one of the cusps of the aortic valve which was  concerning for an aortic dissection.  He did have a CT scan showing no  clear evidence of dissection.  Currently feels well.  No chest pain.   PAST MEDICAL HISTORY:  1. Hypothyroidism.  2. Hypertension.  3. Left elbow surgery.  4. Skin condition.   FAMILY HISTORY:  Significant for coronary artery disease, high blood  pressure, factor V Leiden.   SOCIAL HISTORY:  He does not smoke, drink, or use drugs.   ALLERGIES:  No known drug allergies.   MEDICATIONS:  1. Synthroid 88 mcg a day.  2. Prinivil 20 mg a day.  3. Lopressor 12.5 mg q.6h.  These are hospital medications.   REVIEW OF SYSTEMS:  Significant for the chest pain and shortness of  breath described above.  No focal weakness.  No nausea or vomiting.  No  lightheadedness.  No diaphoresis.  All other systems negative.   PHYSICAL EXAMINATION:  VITAL SIGNS:  Blood pressure is 120/75, pulse 62.  GENERAL:  He  is awake and alert in no apparent distress.  HEAD:  Normocephalic, atraumatic.  EYES:  Extraocular movements intact.  NECK:  No JVD.  No carotid bruits.  CARDIOVASCULAR:  Regular rate and rhythm, S1, S2.  No murmurs, rubs or  gallops.  LUNGS:  Clear to auscultation bilaterally.  ABDOMEN:  Soft, nontender, nondistended.  EXTREMITIES:  No edema.  Palpable pedal pulses.  NEURO:  Alert and oriented x3.  Normal mood and affect.  SKIN:  Warm and dry.  Mild excoriations diffusely.   LABORATORY DATA:  Negative troponin x3.  Hemoglobin A1c of 5.2.  RPR is  non-reactive.  EKG shows normal sinus rhythm, no significant ST-T wave  changes.  Echo result as outlined above.  Coronary CT showed no coronary  artery disease.  There is no evidence of dissection.  There was evidence  of mild diverticulum between the aortic valve and aorta in one area  which correspond to the area of extra flow.  The aortic root  was also  dilated being just over 4 cm.   ASSESSMENT/PLAN:  A 45 year old with chest pain.   PLAN:  1. By coronary CT he has no coronary artery disease.  He has ruled out      for MI.  This is atypical chest pain.  2. Incidentally the area of his dilated aortic root was found.  He      also has a bicuspid aortic valve. Continue him on low dose beta      blocker.  Continue aggressive blood pressure control.  Will follow      with serial echocardiograms.  Endocarditis prophylaxis given      bicuispid aortic valve.  3. Continue treatment for hypothyroidism.  4. Will also try and obtain his most recent cholesterol testing.  I      will have follow-up with him as an outpatient.      Corky Crafts, MD  Electronically Signed     JSV/MEDQ  D:  10/11/2007  T:  10/12/2007  Job:  [id number]

## 2011-09-30 LAB — BASIC METABOLIC PANEL
CO2: 30
Calcium: 9
GFR calc Af Amer: >60
GFR calc non Af Amer: >60
Potassium: 3.4 — ABNORMAL LOW
Sodium: 141

## 2011-09-30 LAB — CBC
HCT: 44.3
MCHC: 33.7
MCHC: 34.2
MCHC: 34.5
MCV: 96.3
Platelets: 216
Platelets: 227
RBC: 4.96
RDW: 12.7
RDW: 12.7
RDW: 12.8
WBC: 6.4
WBC: 8.3

## 2011-09-30 LAB — I-STAT 8, (EC8 V) (CONVERTED LAB)
BUN: 15
Chloride: 108
HCT: 49
Hemoglobin: 16.7
Operator id: 257131
Potassium: 3.8
pCO2, Ven: 39.6 — ABNORMAL LOW

## 2011-09-30 LAB — COMPREHENSIVE METABOLIC PANEL
AST: 38 — ABNORMAL HIGH
CO2: 28
Chloride: 103
Creatinine, Ser: 0.94
GFR calc Af Amer: >60
GFR calc non Af Amer: >60
Glucose, Bld: 100 — ABNORMAL HIGH
Total Bilirubin: 1

## 2011-09-30 LAB — POCT CARDIAC MARKERS: CKMB, poc: 1.8

## 2011-09-30 LAB — DIFFERENTIAL
Basophils Absolute: 0
Basophils Relative: 1
Eosinophils Absolute: 0.2
Eosinophils Relative: 3
Monocytes Absolute: 0.6

## 2011-09-30 LAB — TSH: TSH: 7.093 — ABNORMAL HIGH

## 2011-09-30 LAB — CARDIAC PANEL(CRET KIN+CKTOT+MB+TROPI)
CK, MB: 1.6
CK, MB: 2
Relative Index: 1.3
Total CK: 127
Total CK: 181

## 2011-09-30 LAB — HEMOGLOBIN A1C: Mean Plasma Glucose: 108

## 2011-09-30 LAB — LIPID PANEL
Cholesterol: 151
HDL: 40

## 2011-09-30 LAB — CK TOTAL AND CKMB (NOT AT ARMC): CK, MB: 2.2

## 2011-09-30 LAB — MAGNESIUM: Magnesium: 2

## 2012-10-17 ENCOUNTER — Other Ambulatory Visit: Payer: Self-pay | Admitting: Interventional Cardiology

## 2012-10-17 DIAGNOSIS — I359 Nonrheumatic aortic valve disorder, unspecified: Secondary | ICD-10-CM

## 2012-10-19 ENCOUNTER — Ambulatory Visit
Admission: RE | Admit: 2012-10-19 | Discharge: 2012-10-19 | Disposition: A | Discharge status: Home / Self Care | Payer: BC Managed Care – PPO | Source: Ambulatory Visit | Attending: Interventional Cardiology | Admitting: Interventional Cardiology

## 2012-10-19 DIAGNOSIS — I359 Nonrheumatic aortic valve disorder, unspecified: Secondary | ICD-10-CM

## 2012-10-19 MED ORDER — GADOBENATE DIMEGLUMINE 529 MG/ML IV SOLN
20.0000 mL | Freq: Once | INTRAVENOUS | Status: AC | PRN
  Administered 2012-10-19: 20 mL via INTRAVENOUS

## 2013-10-16 ENCOUNTER — Encounter: Payer: Self-pay | Admitting: *Deleted

## 2013-10-16 ENCOUNTER — Encounter: Payer: Self-pay | Admitting: Interventional Cardiology

## 2013-10-17 ENCOUNTER — Encounter: Payer: Self-pay | Admitting: Interventional Cardiology

## 2013-10-17 ENCOUNTER — Ambulatory Visit (INDEPENDENT_AMBULATORY_CARE_PROVIDER_SITE_OTHER): Payer: BC Managed Care – PPO | Admitting: Interventional Cardiology

## 2013-10-17 VITALS — BP 160/108 | HR 61 | Ht 72.0 in | Wt 231.0 lb

## 2013-10-17 DIAGNOSIS — I10 Essential (primary) hypertension: Secondary | ICD-10-CM

## 2013-10-17 DIAGNOSIS — I712 Thoracic aortic aneurysm, without rupture: Secondary | ICD-10-CM

## 2013-10-17 DIAGNOSIS — I359 Nonrheumatic aortic valve disorder, unspecified: Secondary | ICD-10-CM

## 2013-10-17 DIAGNOSIS — Q231 Congenital insufficiency of aortic valve: Secondary | ICD-10-CM

## 2013-10-17 NOTE — Patient Instructions (Signed)
Your physician has requested that you have an echocardiogram. Echocardiography is a painless test that uses sound waves to create images of your heart. It provides your doctor with information about the size and shape of your heart and how well your heart's chambers and valves are working. This procedure takes approximately one hour. There are no restrictions for this procedure.  Your physician recommends that you continue on your current medications as directed. Please refer to the Current Medication list given to you today.  Your physician wants you to follow-up in: 1 year with Dr. Varanasi. You will receive a reminder letter in the mail two months in advance. If you don't receive a letter, please call our office to schedule the follow-up appointment.  

## 2013-10-17 NOTE — Progress Notes (Signed)
Patient ID: [name], male   DOB: [birth year], 47 y.o.   MRN: [id number]    [address] [place], [place]  [address] Phone: [phone] Fax:  [phone]  Date:  10/17/2013   ID:  REGINAL WOJCICKI, DOB 1966-05-02, MRN [id number]  PCP:   Duane Lope, MD      History of Present Illness: [name] is a 47 y.o. male with a bicuspid aortic valve, and associated thoracic aortic aneurysm. BP is 120/80-90 at home. Hypertension:  He is not exercising much. He walks at work. Does not have time for exercise. Denies : Chest pain.  Dizziness.  Leg edema.  Orthopnea.  Paroxysmal nocturnal dyspnea.  Palpitations.  Cough.  Blurry vision.  Syncope.     Wt Readings from Last 3 Encounters:  10/17/13 231 lb (104.781 kg)  06/04/10 200 lb 3.2 oz (90.81 kg)  04/23/10 203 lb (92.08 kg)     Past Medical History  Diagnosis Date  . HYPERLIPIDEMIA   . HYPERTENSION   . PE   . Thoracic aneurysm without mention of rupture   . VASCULITIS   . HYPERTENSION NEC   . HYPOTHYROIDISM   . COAGULOPATHY, COUMADIN-INDUCED   . Primary hypercoagulable state   . CHEST PAIN UNSPECIFIED   . Heartburn   . DYSPHAGIA UNSPECIFIED   . DYSPHAGIA OROPHARYNGEAL PHASE   . DYSPHAGIA   . ABDOMINAL PAIN-EPIGASTRIC     Current Outpatient Prescriptions  Medication Sig Dispense Refill  . aspirin 81 MG tablet Take 81 mg by mouth daily.      Marland Kitchen levothyroxine (SYNTHROID, LEVOTHROID) 175 MCG tablet Take 175 mcg by mouth daily before breakfast.      . losartan-hydrochlorothiazide (HYZAAR) 100-12.5 MG per tablet Take 1 tablet by mouth daily.      . metoprolol succinate (TOPROL-XL) 25 MG 24 hr tablet Take 25 mg by mouth daily.       No current facility-administered medications for this visit.    Allergies:   No Known Allergies  Social History:  The patient  reports that he has never smoked. He does not have any smokeless tobacco history on file. He reports that he does not drink alcohol or  use illicit drugs.   Family History:  The patient's family history is not on file.   ROS:  Please see the history of present illness.  No nausea, vomiting.  No fevers, chills.  No focal weakness.  No dysuria.    All other systems reviewed and negative.   PHYSICAL EXAM: VS:  BP 160/108  Pulse 61  Ht 6' (1.829 m)  Wt 231 lb (104.781 kg)  BMI 31.32 kg/m2  SpO2 93% Well nourished, well developed, in no acute distress HEENT: normal Neck: no JVD, no carotid bruits Cardiac:  normal S1, S2; RRR; 2/6 systolic murmur Lungs:  clear to auscultation bilaterally, no wheezing, rhonchi or rales Abd: soft, nontender, no hepatomegaly Ext: no edema Skin: warm and dry Neuro:   no focal abnormalities noted  EKG:  normal     ASSESSMENT AND PLAN:  Aortic valve disorders  Bicuspid aortic valve. Not causing problems at this time.  Ao root ok by echo in Jan 2011. 4.3 x 4.2 ascending aorta aneurysm by CT in March 2011.  we'll check echo 2. Essential hypertension, benign  Continue Lisinopril-Hydrochlorothiazide Tablet, 20/12.5 mg, 1 tablet, Orally, Once a day Continue Metoprolol Succinate Tablet Extended Release 24 Hour, 25 MG, po, Orally, once a day Diagnostic Imaging:EKG Harward,Amy  10/17/2012 03:42:32 PM > VARANASI,JAY 10/17/2012 03:58:07 PM > normal  Controlled at home. 120/75.   Recheck in the office was 144/84.  Increase exercise, 150 min/wek.   Thoracic aneurysm:  4.3 by MRA in 10/13.  Stable last year.  See what it measures by echo.   Signed, Fredric Mare, MD, [facility] 10/17/2013 4:01 PM

## 2013-11-02 ENCOUNTER — Ambulatory Visit (HOSPITAL_COMMUNITY): Payer: BC Managed Care – PPO | Attending: Interventional Cardiology

## 2013-11-02 DIAGNOSIS — I079 Rheumatic tricuspid valve disease, unspecified: Secondary | ICD-10-CM | POA: Insufficient documentation

## 2013-11-02 DIAGNOSIS — Q231 Congenital insufficiency of aortic valve: Secondary | ICD-10-CM | POA: Insufficient documentation

## 2013-11-02 DIAGNOSIS — I712 Thoracic aortic aneurysm, without rupture, unspecified: Secondary | ICD-10-CM | POA: Insufficient documentation

## 2013-11-02 DIAGNOSIS — R079 Chest pain, unspecified: Secondary | ICD-10-CM | POA: Insufficient documentation

## 2013-11-02 DIAGNOSIS — E785 Hyperlipidemia, unspecified: Secondary | ICD-10-CM | POA: Insufficient documentation

## 2013-11-02 DIAGNOSIS — I10 Essential (primary) hypertension: Secondary | ICD-10-CM | POA: Insufficient documentation

## 2013-11-02 DIAGNOSIS — I359 Nonrheumatic aortic valve disorder, unspecified: Secondary | ICD-10-CM

## 2013-11-02 DIAGNOSIS — I059 Rheumatic mitral valve disease, unspecified: Secondary | ICD-10-CM | POA: Insufficient documentation

## 2013-11-02 NOTE — Progress Notes (Signed)
Echocardiogram performed.  

## 2014-04-06 ENCOUNTER — Encounter: Payer: Self-pay | Admitting: Interventional Cardiology

## 2014-11-21 ENCOUNTER — Encounter: Payer: Self-pay | Admitting: Interventional Cardiology

## 2014-11-21 ENCOUNTER — Ambulatory Visit (INDEPENDENT_AMBULATORY_CARE_PROVIDER_SITE_OTHER): Payer: BC Managed Care – PPO | Admitting: Interventional Cardiology

## 2014-11-21 VITALS — BP 158/96 | HR 61 | Ht 72.0 in | Wt 237.4 lb

## 2014-11-21 DIAGNOSIS — I712 Thoracic aortic aneurysm, without rupture, unspecified: Secondary | ICD-10-CM

## 2014-11-21 DIAGNOSIS — I10 Essential (primary) hypertension: Secondary | ICD-10-CM

## 2014-11-21 DIAGNOSIS — Q231 Congenital insufficiency of aortic valve: Secondary | ICD-10-CM

## 2014-11-21 NOTE — Patient Instructions (Addendum)
Your physician recommends that you continue on your current medications as directed. Please refer to the Current Medication list given to you today.    Your physician has requested that you have a cardiac MRI. Cardiac MRI uses a computer to create images of your heart as its beating, producing both still and moving pictures of your heart and major blood vessels. For further information please visit [email or web address]. Please follow the instruction sheet given to you today for more information. THIS SHOULD BE SCHEDULED FOR ONE YEAR OUT IN DEC 2016    Your physician wants you to follow-up in: ONE YEAR WITH DR [name] You will receive a reminder letter in the mail two months in advance. If you don't receive a letter, please call our office to schedule the follow-up appointment.

## 2014-11-21 NOTE — Progress Notes (Signed)
Patient ID: [name], male   DOB: 10/06/1966, 48 y.o.   MRN: [id number] Patient ID: [name], male   DOB: 05/10/1966, 48 y.o.   MRN: [id number]    [address] [place], [place]  [address] Phone: [phone] Fax:  [phone]  Date:  11/21/2014   ID:  [name], DOB 08/10/1966, MRN [id number]  PCP:   Duane Lopeoss, Alan, MD      History of Present Illness: [name] is a 48 y.o. male with a bicuspid aortic valve, and associated thoracic aortic aneurysm. BP is 120/80-90 at home. Hypertension:  He is not exercising much. He walks at work. Does not have time for exercise.  Feels ok at work when he does that exercise.  Denies : Chest pain.  Dizziness.  Leg edema.  Orthopnea.  Paroxysmal nocturnal dyspnea.  Palpitations.  Cough.  Blurry vision.  Syncope.     Wt Readings from Last 3 Encounters:  11/21/14 237 lb 6.4 oz (107.684 kg)  10/17/13 231 lb (104.781 kg)  06/04/10 200 lb 3.2 oz (90.81 kg)     Past Medical History  Diagnosis Date  . HYPERLIPIDEMIA   . HYPERTENSION   . PE   . Thoracic aneurysm without mention of rupture   . VASCULITIS   . HYPERTENSION NEC   . HYPOTHYROIDISM   . COAGULOPATHY, COUMADIN-INDUCED   . Primary hypercoagulable state   . CHEST PAIN UNSPECIFIED   . Heartburn   . DYSPHAGIA UNSPECIFIED   . DYSPHAGIA OROPHARYNGEAL PHASE   . DYSPHAGIA   . ABDOMINAL PAIN-EPIGASTRIC     Current Outpatient Prescriptions  Medication Sig Dispense Refill  . aspirin 81 MG tablet Take 81 mg by mouth daily.    Marland Kitchen. levothyroxine (SYNTHROID, LEVOTHROID) 175 MCG tablet Take 175 mcg by mouth daily before breakfast.    . losartan (COZAAR) 100 MG tablet Take 100 mg by mouth daily.  4  . metoprolol succinate (TOPROL-XL) 50 MG 24 hr tablet Take 50 mg by mouth daily.  4   No current facility-administered medications for this visit.    Allergies:   No Known Allergies  Social History:  The patient  reports that he has never smoked. He  does not have any smokeless tobacco history on file. He reports that he does not drink alcohol or use illicit drugs.   Family History:  The patient's family history includes Heart disease in his mother; Hypertension in his father.   ROS:  Please see the history of present illness.  No nausea, vomiting.  No fevers, chills.  No focal weakness.  No dysuria.    All other systems reviewed and negative.   PHYSICAL EXAM: VS:  BP 158/96 mmHg  Pulse 61  Ht 6' (1.829 m)  Wt 237 lb 6.4 oz (107.684 kg)  BMI 32.19 kg/m2 Well nourished, well developed, in no acute distress HEENT: normal Neck: no JVD, no carotid bruits Cardiac:  normal S1, S2; RRR; 2/6 systolic murmur Lungs:  clear to auscultation bilaterally, no wheezing, rhonchi or rales Abd: soft, nontender, no hepatomegaly Ext: no edema Skin: warm and dry, read rash on right abdomen Neuro:   no focal abnormalities noted Psych: normal afect  EKG:  normal     ASSESSMENT AND PLAN:  Aortic valve disorders  Bicuspid aortic valve. Not causing problems at this time.  Ao root ok by echo in Jan 2011. 4.3 x 4.2 ascending aorta aneurysm by CT in March 2011.  we'll check echo  2. Essential hypertension, benign  Off of Lisinopril-Hydrochlorothiazide Tablet, 20/12.5 mg, 1 tablet, Orally, Once a day Continue losartan 100 mg daily. Continue Metoprolol Succinate Tablet Extended Release 24 Hour, 50 MG, po, Orally, once a day Diagnostic Imaging:EKG Harward,Amy 10/17/2012 03:42:32 PM > Melana Hingle,JAY 10/17/2012 03:58:07 PM > normal  Controlled at home. 120-130/75.   High in the office typically.   Increase exercise, 150 min/wek.   Thoracic aneurysm:  4.3 by MRA in 10/13.  Stable last year.  Will plan for Cardiac MRI  In 10/16 to look at aortic valve and ascending aorta.    Signed, Fredric MareJay S. Sterling Ucci, MD, [facility] 11/21/2014 12:04 PM

## 2015-09-13 ENCOUNTER — Encounter: Payer: Self-pay | Admitting: Interventional Cardiology

## 2015-11-13 ENCOUNTER — Encounter: Payer: Self-pay | Admitting: Interventional Cardiology

## 2015-11-13 ENCOUNTER — Telehealth: Payer: Self-pay | Admitting: Interventional Cardiology

## 2015-11-13 NOTE — Telephone Encounter (Signed)
New message      Pt want to know who ordered the MRI scheduled on 12-03-15 and why is he having one?

## 2015-11-13 NOTE — Telephone Encounter (Signed)
Calling stating he didn't realize that he was scheduled for MRI and wanted to know why he was having it done.  Advised that he had MRI last December and was suggested he had recheck in one year. Dr. [name] ordered the test.  Advised that this is to recheck his Thoracic Aneurysm.  He verbalizes understanding and will keep appointment.

## 2015-12-03 ENCOUNTER — Telehealth: Payer: Self-pay | Admitting: *Deleted

## 2015-12-03 ENCOUNTER — Ambulatory Visit (HOSPITAL_COMMUNITY)
Admission: RE | Admit: 2015-12-03 | Discharge: 2015-12-03 | Disposition: A | Discharge status: Home / Self Care | Payer: BLUE CROSS/BLUE SHIELD | Source: Ambulatory Visit | Attending: Interventional Cardiology | Admitting: Interventional Cardiology

## 2015-12-03 DIAGNOSIS — Q231 Congenital insufficiency of aortic valve: Secondary | ICD-10-CM | POA: Insufficient documentation

## 2015-12-03 DIAGNOSIS — I712 Thoracic aortic aneurysm, without rupture, unspecified: Secondary | ICD-10-CM

## 2015-12-03 DIAGNOSIS — I517 Cardiomegaly: Secondary | ICD-10-CM | POA: Diagnosis not present

## 2015-12-03 LAB — CREATININE, SERUM
CREATININE: 1.01 mg/dL (ref 0.61–1.24)
GFR calc non Af Amer: >60 mL/min (ref >60)

## 2015-12-03 MED ORDER — GADOBENATE DIMEGLUMINE 529 MG/ML IV SOLN
35.0000 mL | Freq: Once | INTRAVENOUS | Status: AC | PRN
  Administered 2015-12-03: 35 mL via INTRAVENOUS

## 2015-12-03 NOTE — Telephone Encounter (Signed)
Note below copied from message to triage.  I spoke with Grover CanavanKrystal in radiology and Dr. Shirlee LatchMcLean is requesting MRA added to test today as outlined below.  Pt has not had recent BMP and Grover CanavanKrystal stated this can be done in radiology today prior to test.  Will forward to Dr. [name] to see if MRA can be added.  Will also forward to precert department for review.      Patient Demographics    Patient Name Sex DOB SSN    [name], Ohn F Male 06/04/1966      FW: Additional Exam Order Needed TODAY  Received: Today   Loa SocksIvy M Martin, LPN  P [facility]v [facility] Triage Cc: Jacqlyn KraussAnne M Lankford, RN   Phone Number: [phone]         This is not a Delton SeeNelson pt.  This is a Dr [name] pt.  Will forward this message to his nurse for further review and follow-up       Previous Messages    ----- Message -----   From: Sampson GoonShawnee I Trigloff   Sent: 12/03/2015  8:59 AM    To: Loa SocksIvy M Martin, LPN  Subject: FW: Additional Exam Order Needed TODAY        ----- Message -----   From: Greig CastillaKrystal N Garrett   Sent: 12/03/2015  8:50 AM    To: Sampson GoonShawnee I Trigloff  Subject: Additional Exam Order Needed           Shawnee,   I spoke to Freida BusmanDalton about this patient this morning and he wants a MRA of the chest with contrast added to this patient. We currently only have the cardiac MRI with and without contrast scheduled. Can you see if this can be added and precert obtained? The patient is scheduled for 12pm today.   Thank you!  Grover CanavanKrystal

## 2015-12-03 NOTE — Telephone Encounter (Signed)
Spoke with Dr. [name] and OK to add MRA.  Reviewed with precert and no additional precert needed.  Radiology notified.

## 2015-12-06 ENCOUNTER — Telehealth: Payer: Self-pay | Admitting: Interventional Cardiology

## 2015-12-06 NOTE — Telephone Encounter (Signed)
Reviewed results of testing with pt.  He will f/u as scheduled.

## 2015-12-06 NOTE — Telephone Encounter (Signed)
New message     Returning a call to the nurse to get MRI results

## 2016-01-09 ENCOUNTER — Encounter: Payer: Self-pay | Admitting: Interventional Cardiology

## 2016-01-09 ENCOUNTER — Ambulatory Visit (INDEPENDENT_AMBULATORY_CARE_PROVIDER_SITE_OTHER): Payer: Managed Care, Other (non HMO) | Admitting: Interventional Cardiology

## 2016-01-09 VITALS — BP 124/90 | HR 60 | Ht 72.0 in | Wt 232.0 lb

## 2016-01-09 DIAGNOSIS — I10 Essential (primary) hypertension: Secondary | ICD-10-CM | POA: Diagnosis not present

## 2016-01-09 DIAGNOSIS — Q231 Congenital insufficiency of aortic valve: Secondary | ICD-10-CM | POA: Diagnosis not present

## 2016-01-09 DIAGNOSIS — I712 Thoracic aortic aneurysm, without rupture, unspecified: Secondary | ICD-10-CM

## 2016-01-09 NOTE — Patient Instructions (Signed)
**Note De-Identified Favour Aleshire Obfuscation** Medication Instructions:  Same-no changes  Labwork: None  Testing/Procedures: None  Follow-Up: Your physician wants you to follow-up in: 1 year. You will receive a reminder letter in the mail two months in advance. If you don't receive a letter, please call our office to schedule the follow-up appointment.   Any Other Special Instructions Will Be Listed Below (If Applicable). We will contact you closer to December 2017 concerning your chest MRI.     If you need a refill on your cardiac medications before your next appointment, please call your pharmacy.

## 2016-01-09 NOTE — Progress Notes (Signed)
Patient ID: [name], male   DOB: [birth year], 50 y.o.   MRN: [id number]     Cardiology Office Note   Date:  01/09/2016   ID:  KAMARI BUCH, DOB [birth year], MRN [id number]  PCP:   Duane Lope, MD    No chief complaint on file. f/u bicuspid Ao valve, thoracic aneurysm   Wt Readings from Last 3 Encounters:  01/09/16 232 lb (105.235 kg)  11/21/14 237 lb 6.4 oz (107.684 kg)  10/17/13 231 lb (104.781 kg)       History of Present Illness: [name] is a 50 y.o. male   with a bicuspid aortic valve, and associated thoracic aortic aneurysm. BP is 120/80-90 at home.  He has been feeling well. He is physically active at his job. No chest discomfort or significant shortness of breath. Overall, he feels that he is doing well. He had a recent MRI. This showed that since 2013, his thoracic aortic aneurysm has increased from 4.3 cm to 4.5 cm.  His thyroid is been regulated. His blood pressure is been controlled. He had his lipids checked with his primary care doctor and they were controlled.    Past Medical History  Diagnosis Date  . HYPERLIPIDEMIA   . HYPERTENSION   . PE   . Thoracic aneurysm without mention of rupture   . VASCULITIS   . HYPERTENSION NEC   . HYPOTHYROIDISM   . COAGULOPATHY, COUMADIN-INDUCED   . Primary hypercoagulable state (HCC)   . CHEST PAIN UNSPECIFIED   . Heartburn   . DYSPHAGIA UNSPECIFIED   . DYSPHAGIA OROPHARYNGEAL PHASE   . DYSPHAGIA   . ABDOMINAL PAIN-EPIGASTRIC     No past surgical history on file.   Current Outpatient Prescriptions  Medication Sig Dispense Refill  . aspirin 81 MG tablet Take 81 mg by mouth daily.    Marland Kitchen levothyroxine (SYNTHROID, LEVOTHROID) 175 MCG tablet Take 175 mcg by mouth daily before breakfast.    . losartan (COZAAR) 100 MG tablet Take 100 mg by mouth daily.  4  . metoprolol succinate (TOPROL-XL) 50 MG 24 hr tablet Take 50 mg by mouth daily.  4   No current facility-administered medications for this visit.      Allergies:   Latex    Social History:  The patient  reports that he has never smoked. He does not have any smokeless tobacco history on file. He reports that he does not drink alcohol or use illicit drugs.   Family History:  The patient's family history includes Heart disease in his mother; Hypertension in his father.    ROS:  Please see the history of present illness.   Otherwise, review of systems are positive for chronic rash.   All other systems are reviewed and negative.    PHYSICAL EXAM: VS:  BP 124/90 mmHg  Pulse 60  Ht 6' (1.829 m)  Wt 232 lb (105.235 kg)  BMI 31.46 kg/m2 , BMI Body mass index is 31.46 kg/(m^2). GEN: Well nourished, well developed, in no acute distress HEENT: normal Neck: no JVD, carotid bruits, or masses Cardiac: RRR; soft early systolic murmurs, no rubs, or gallops,no edema  Respiratory:  clear to auscultation bilaterally, normal work of breathing GI: soft, nontender, nondistended, + BS MS: no deformity or atrophy Skin: warm and dry, no rash Neuro:  Strength and sensation are intact Psych: euthymic mood, full affect   EKG:   The ekg ordered today demonstrates sinus bradycardia, no ST segment changes   Recent Labs:  12/03/2015: Creatinine, Ser 1.01   Lipid Panel    Component Value Date/Time   CHOL  04/11/2010 0852    119        ATP III CLASSIFICATION:  <200     mg/dL   Desirable  161-096  mg/dL   Borderline High  >=045    mg/dL   High          TRIG 69 04/11/2010 0852   HDL 32* 04/11/2010 0852   CHOLHDL 3.7 04/11/2010 0852   VLDL 14 04/11/2010 0852   LDLCALC  04/11/2010 0852    73        Total Cholesterol/HDL:CHD Risk Coronary Heart Disease Risk Table                     Men   Women  1/2 Average Risk   3.4   3.3  Average Risk       5.0   4.4  2 X Average Risk   9.6   7.1  3 X Average Risk  23.4   11.0        Use the calculated Patient Ratio above and the CHD Risk Table to determine the patient's CHD Risk.        ATP III  CLASSIFICATION (LDL):  <100     mg/dL   Optimal  409-811  mg/dL   Near or Above                    Optimal  130-159  mg/dL   Borderline  914-782  mg/dL   High  >956     mg/dL   Very High     Other studies Reviewed: Additional studies/ records that were reviewed today with results demonstrating: MRI from December 2016 reviewed.   ASSESSMENT AND PLAN:  1. Bicuspid aortic valve:  No signs or symptoms of severe aortic stenosis. 2. Thoracic aortic aneurysm: small increase in size over a three-year period. Will repeat MRI study in December 2017.  I stressed the importance that he should not do any heavy lifting or straining by himself. At work, he has to do minimal lifting. If anything heavy comes up, he should ask for help. 3. OZH:YQMV controlled at home. Continue current medicines. He'll let us know his blood pressure is elevated at home.   Current medicines are reviewed at length with the patient today.  The patient concerns regarding his medicines were addressed.  The following changes have been made:  No change  Labs/ tests ordered today include:  No orders of the defined types were placed in this encounter.    Recommend 150 minutes/week of aerobic exercise Low fat, low carb, high fiber diet recommended  Disposition:   FU in 1 year   Delorise Jackson., MD  01/09/2016 3:53 PM    [facility] [address] [place], [place], [place]  [address] Phone: [phone]; Fax: [phone]

## 2016-05-05 ENCOUNTER — Telehealth: Payer: Self-pay

## 2016-05-05 NOTE — Telephone Encounter (Signed)
**Note De-Identified Gitty Osterlund Obfuscation** The pt dropped off an EKG dated 04/29/16 [facility] for Dr [name] to review. Per Dr [name] "EKG ok, computer over read and incorrect". The pt has been advised and per his request I called [facility] and left a detailed message as that office closes at 4:30. The pt did not know the MDs name at [facility] that ordered this EKG and I cant read name written on EKG.

## 2016-10-22 ENCOUNTER — Telehealth: Payer: Self-pay | Admitting: Interventional Cardiology

## 2016-10-22 ENCOUNTER — Encounter: Payer: Self-pay | Admitting: Interventional Cardiology

## 2016-10-22 NOTE — Telephone Encounter (Signed)
error 

## 2016-11-04 ENCOUNTER — Other Ambulatory Visit (HOSPITAL_COMMUNITY): Payer: Managed Care, Other (non HMO)

## 2016-11-25 ENCOUNTER — Encounter (HOSPITAL_COMMUNITY): Payer: Self-pay

## 2016-11-25 ENCOUNTER — Ambulatory Visit (HOSPITAL_COMMUNITY)
Admission: RE | Admit: 2016-11-25 | Discharge: 2016-11-25 | Disposition: A | Discharge status: Home / Self Care | Payer: Managed Care, Other (non HMO) | Source: Ambulatory Visit | Attending: Interventional Cardiology | Admitting: Interventional Cardiology

## 2016-11-25 DIAGNOSIS — I712 Thoracic aortic aneurysm, without rupture, unspecified: Secondary | ICD-10-CM

## 2016-11-25 DIAGNOSIS — Z01812 Encounter for preprocedural laboratory examination: Secondary | ICD-10-CM | POA: Insufficient documentation

## 2016-11-25 LAB — CREATININE, SERUM
Creatinine, Ser: 0.92 mg/dL (ref 0.61–1.24)
GFR calc Af Amer: >60 mL/min (ref >60)

## 2016-12-03 ENCOUNTER — Ambulatory Visit (HOSPITAL_COMMUNITY): Admission: RE | Admit: 2016-12-03 | Payer: Managed Care, Other (non HMO) | Source: Ambulatory Visit

## 2016-12-03 ENCOUNTER — Encounter (HOSPITAL_COMMUNITY): Payer: Self-pay

## 2016-12-05 ENCOUNTER — Inpatient Hospital Stay (HOSPITAL_COMMUNITY)
Admission: EM | Admit: 2016-12-05 | Discharge: 2016-12-08 | DRG: 287 | Disposition: A | Discharge status: Home / Self Care | Payer: Managed Care, Other (non HMO) | Source: Ambulatory Visit | Attending: Internal Medicine | Admitting: Internal Medicine

## 2016-12-05 ENCOUNTER — Encounter (HOSPITAL_COMMUNITY): Payer: Self-pay | Admitting: *Deleted

## 2016-12-05 ENCOUNTER — Emergency Department (HOSPITAL_COMMUNITY): Payer: Managed Care, Other (non HMO)

## 2016-12-05 DIAGNOSIS — Z86711 Personal history of pulmonary embolism: Secondary | ICD-10-CM

## 2016-12-05 DIAGNOSIS — I712 Thoracic aortic aneurysm, without rupture, unspecified: Secondary | ICD-10-CM | POA: Diagnosis present

## 2016-12-05 DIAGNOSIS — D68318 Other hemorrhagic disorder due to intrinsic circulating anticoagulants, antibodies, or inhibitors: Secondary | ICD-10-CM | POA: Diagnosis present

## 2016-12-05 DIAGNOSIS — I10 Essential (primary) hypertension: Secondary | ICD-10-CM | POA: Diagnosis not present

## 2016-12-05 DIAGNOSIS — Z7982 Long term (current) use of aspirin: Secondary | ICD-10-CM

## 2016-12-05 DIAGNOSIS — R1312 Dysphagia, oropharyngeal phase: Secondary | ICD-10-CM | POA: Diagnosis present

## 2016-12-05 DIAGNOSIS — Z8249 Family history of ischemic heart disease and other diseases of the circulatory system: Secondary | ICD-10-CM

## 2016-12-05 DIAGNOSIS — E876 Hypokalemia: Secondary | ICD-10-CM | POA: Diagnosis present

## 2016-12-05 DIAGNOSIS — R9439 Abnormal result of other cardiovascular function study: Secondary | ICD-10-CM

## 2016-12-05 DIAGNOSIS — R0789 Other chest pain: Principal | ICD-10-CM | POA: Diagnosis present

## 2016-12-05 DIAGNOSIS — E039 Hypothyroidism, unspecified: Secondary | ICD-10-CM | POA: Diagnosis present

## 2016-12-05 DIAGNOSIS — R079 Chest pain, unspecified: Secondary | ICD-10-CM | POA: Diagnosis not present

## 2016-12-05 DIAGNOSIS — Z91040 Latex allergy status: Secondary | ICD-10-CM

## 2016-12-05 DIAGNOSIS — E785 Hyperlipidemia, unspecified: Secondary | ICD-10-CM | POA: Diagnosis present

## 2016-12-05 DIAGNOSIS — R12 Heartburn: Secondary | ICD-10-CM | POA: Diagnosis not present

## 2016-12-05 DIAGNOSIS — Z79899 Other long term (current) drug therapy: Secondary | ICD-10-CM

## 2016-12-05 DIAGNOSIS — Q231 Congenital insufficiency of aortic valve: Secondary | ICD-10-CM

## 2016-12-05 DIAGNOSIS — F419 Anxiety disorder, unspecified: Secondary | ICD-10-CM | POA: Diagnosis present

## 2016-12-05 DIAGNOSIS — Q2381 Bicuspid aortic valve: Secondary | ICD-10-CM

## 2016-12-05 HISTORY — DX: Thoracic aortic aneurysm, without rupture: I71.2

## 2016-12-05 HISTORY — DX: Thoracic aortic aneurysm, without rupture, unspecified: I71.20

## 2016-12-05 LAB — CBC WITH DIFFERENTIAL/PLATELET
BASOS ABS: 0 10*3/uL (ref 0.0–0.1)
Basophils Relative: 0 %
Eosinophils Absolute: 0.1 10*3/uL (ref 0.0–0.7)
Eosinophils Relative: 3 %
HEMATOCRIT: 44.9 % (ref 39.0–52.0)
Hemoglobin: 16.1 g/dL (ref 13.0–17.0)
LYMPHS PCT: 27 %
Lymphs Abs: 1.5 10*3/uL (ref 0.7–4.0)
MCH: 32.3 pg (ref 26.0–34.0)
MCHC: 35.9 g/dL (ref 30.0–36.0)
MCV: 90.2 fL (ref 78.0–100.0)
Monocytes Absolute: 0.7 10*3/uL (ref 0.1–1.0)
Monocytes Relative: 12 %
NEUTROS ABS: 3.1 10*3/uL (ref 1.7–7.7)
Neutrophils Relative %: 58 %
Platelets: 221 10*3/uL (ref 150–400)
RBC: 4.98 MIL/uL (ref 4.22–5.81)
RDW: 13.1 % (ref 11.5–15.5)
WBC: 5.4 10*3/uL (ref 4.0–10.5)

## 2016-12-05 LAB — COMPREHENSIVE METABOLIC PANEL
ALT: 24 U/L (ref 17–63)
AST: 30 U/L (ref 15–41)
Albumin: 3.7 g/dL (ref 3.5–5.0)
Alkaline Phosphatase: 47 U/L (ref 38–126)
Anion gap: 6 (ref 5–15)
BILIRUBIN TOTAL: 1.2 mg/dL (ref 0.3–1.2)
BUN: 9 mg/dL (ref 6–20)
CO2: 28 mmol/L (ref 22–32)
CREATININE: 1.02 mg/dL (ref 0.61–1.24)
Calcium: 8.8 mg/dL — ABNORMAL LOW (ref 8.9–10.3)
Chloride: 108 mmol/L (ref 101–111)
GFR calc Af Amer: >60 mL/min (ref >60)
Glucose, Bld: 87 mg/dL (ref 65–99)
Potassium: 3.3 mmol/L — ABNORMAL LOW (ref 3.5–5.1)
Sodium: 142 mmol/L (ref 135–145)
TOTAL PROTEIN: 6.7 g/dL (ref 6.5–8.1)

## 2016-12-05 LAB — I-STAT TROPONIN, ED: Troponin i, poc: 0 ng/mL (ref 0.00–0.08)

## 2016-12-05 LAB — TROPONIN I: Troponin I: <0.03 ng/mL (ref <0.03)

## 2016-12-05 MED ORDER — HYDRALAZINE HCL 20 MG/ML IJ SOLN
5.0000 mg | Freq: Three times a day (TID) | INTRAMUSCULAR | Status: DC | PRN
  Administered 2016-12-05 – 2016-12-06 (×2): 10 mg via INTRAVENOUS
  Filled 2016-12-05 (×2): qty 1

## 2016-12-05 MED ORDER — ACETAMINOPHEN 325 MG PO TABS
650.0000 mg | ORAL_TABLET | ORAL | Status: DC | PRN
  Administered 2016-12-06 – 2016-12-07 (×3): 650 mg via ORAL
  Filled 2016-12-05 (×3): qty 2

## 2016-12-05 MED ORDER — MORPHINE SULFATE (PF) 4 MG/ML IV SOLN: 1.0000 mg | INTRAVENOUS | Status: DC | PRN

## 2016-12-05 MED ORDER — METOPROLOL SUCCINATE ER 50 MG PO TB24
50.0000 mg | ORAL_TABLET | Freq: Every day | ORAL | Status: DC
  Administered 2016-12-06 – 2016-12-08 (×3): 50 mg via ORAL
  Filled 2016-12-05 (×5): qty 1

## 2016-12-05 MED ORDER — LOSARTAN POTASSIUM 50 MG PO TABS
100.0000 mg | ORAL_TABLET | Freq: Every day | ORAL | Status: DC
  Administered 2016-12-06 – 2016-12-08 (×3): 100 mg via ORAL
  Filled 2016-12-05 (×4): qty 2

## 2016-12-05 MED ORDER — PANTOPRAZOLE SODIUM 40 MG PO TBEC
40.0000 mg | DELAYED_RELEASE_TABLET | Freq: Every day | ORAL | Status: DC
  Administered 2016-12-06 – 2016-12-08 (×3): 40 mg via ORAL
  Filled 2016-12-05 (×3): qty 1

## 2016-12-05 MED ORDER — ONDANSETRON HCL 4 MG/2ML IJ SOLN
4.0000 mg | Freq: Four times a day (QID) | INTRAMUSCULAR | Status: DC | PRN
  Administered 2016-12-06: 4 mg via INTRAVENOUS
  Filled 2016-12-05: qty 2

## 2016-12-05 MED ORDER — ESCITALOPRAM OXALATE 10 MG PO TABS
20.0000 mg | ORAL_TABLET | Freq: Every day | ORAL | Status: DC
  Administered 2016-12-06 – 2016-12-08 (×3): 20 mg via ORAL
  Filled 2016-12-05 (×4): qty 2

## 2016-12-05 MED ORDER — ASPIRIN EC 81 MG PO TBEC
81.0000 mg | DELAYED_RELEASE_TABLET | Freq: Every day | ORAL | Status: DC
  Administered 2016-12-06 – 2016-12-08 (×3): 81 mg via ORAL
  Filled 2016-12-05 (×3): qty 1

## 2016-12-05 MED ORDER — LEVOTHYROXINE SODIUM 75 MCG PO TABS
175.0000 ug | ORAL_TABLET | Freq: Every day | ORAL | Status: DC
  Administered 2016-12-06 – 2016-12-08 (×3): 175 ug via ORAL
  Filled 2016-12-05 (×4): qty 1

## 2016-12-05 MED ORDER — GI COCKTAIL ~~LOC~~: 30.0000 mL | Freq: Four times a day (QID) | ORAL | Status: DC | PRN

## 2016-12-05 NOTE — H&P (Signed)
History and Physical    EMMONS TOTH ZOX:[id number] DOB: 1966-02-02 DOA: 12/05/2016   PCP: Duane Lope, MD   Patient coming from:  Home    HPI: [name] is a 50 y.o. male hypertension, hyperlipidemia, hypothyroidism, history of thoracic aortic aneurysm, history of RLL  PE not on chronic anticoagulation, remote history of vasculitis who is being followed as an outpatient by. Varanasi.  The patient was supposed to have an MRI to be evaluated for his thoracic aortic aneurysm 2 days ago, He became very short of breath and unable to complete the study after he presented for that film. These had already happened twice in 2 separate days. He began to have chest pain soon after, on Thursday. The pain is in the left side, nonradiating, feels like a pressure tightness type, is intermittent, but waxes and wanes in severity. Currently, the pain is too out of 10. The patient reports intermittent tingling in the left arm, but denies any Joe pain. He denies any fever or chills diaphoresis palpitation shortness of breath wheezing cough abdominal pain nausea or vomiting. Nothing makes it better or worse. He reports the pain being different than the one he had from the PE. The patient does lift objects at work, sometimes up to 50 pounds. In addition, he is scheduled for a stress test next week by cardiology as outpatient. He denies any sick contacts. He does not smoke. No alcohol or recreational drug use. No recent long distance trips. No Viagra. Very stressed at work    . ED Course:  BP 159/92   Pulse (!) 54   Temp 98.3 F (36.8 C) (Oral)   Resp 11   SpO2 100%    troponin is 0  EKG without acute changes.  Chest x-ray with no acute disease. Because his blood pressure was 159/92, up to 180/100 and arrival by EMS, the patient received IV blood pressure medication, with improvement. His last 2-D echo was in 2014, ejection fraction 55 to 60%. Patient never had a cardiac catheterization.  Review of  Systems: As per HPI otherwise 10 point review of systems negative.   Past Medical History:  Diagnosis Date  . ABDOMINAL PAIN-EPIGASTRIC   . COAGULOPATHY, COUMADIN-INDUCED   . DYSPHAGIA OROPHARYNGEAL PHASE   . Heartburn   . HYPERLIPIDEMIA   . HYPERTENSION   . HYPERTENSION NEC   . HYPOTHYROIDISM   . PE   . Primary hypercoagulable state (HCC)   . Thoracic aneurysm without mention of rupture   . VASCULITIS     History reviewed. No pertinent surgical history.  Social History Social History   Social History  . Marital status: Single    Spouse name: N/A  . Number of children: N/A  . Years of education: N/A   Occupational History  . Not on file.   Social History Main Topics  . Smoking status: Never Smoker  . Smokeless tobacco: Never Used  . Alcohol use No  . Drug use: No  . Sexual activity: Not on file   Other Topics Concern  . Not on file   Social History Narrative  . No narrative on file     Allergies  Allergen Reactions  . Latex Rash    Family History  Problem Relation Age of Onset  . Heart disease Mother   . Hypertension Father       Prior to Admission medications   Medication Sig Start Date End Date Taking? Authorizing Provider  aspirin 81 MG tablet Take 81  mg by mouth daily.   Yes Historical Provider, MD  levothyroxine (SYNTHROID, LEVOTHROID) 175 MCG tablet Take 175 mcg by mouth daily before breakfast.   Yes Historical Provider, MD  losartan (COZAAR) 100 MG tablet Take 100 mg by mouth daily. 09/10/14  Yes Historical Provider, MD  metoprolol succinate (TOPROL-XL) 50 MG 24 hr tablet Take 50 mg by mouth daily. 09/11/14  Yes Historical Provider, MD    Physical Exam:    Vitals:   12/05/16 1330 12/05/16 1345 12/05/16 1400 12/05/16 1440  BP: 141/97 143/91 147/93 159/92  Pulse: 61 (!) 54 (!) 49 (!) 54  Resp: 17 12 13 11   Temp:      TempSrc:      SpO2: 99% 99% 99% 100%       Constitutional: NAD, calm, comfortable  Vitals:   12/05/16 1330  12/05/16 1345 12/05/16 1400 12/05/16 1440  BP: 141/97 143/91 147/93 159/92  Pulse: 61 (!) 54 (!) 49 (!) 54  Resp: 17 12 13 11   Temp:      TempSrc:      SpO2: 99% 99% 99% 100%   Eyes: PERRL, lids and conjunctivae normal ENMT: Mucous membranes are moist. Posterior pharynx clear of any exudate or lesions.Normal dentition.  Neck: normal, supple, no masses, no thyromegaly Respiratory: clear to auscultation bilaterally, no wheezing, no crackles. Normal respiratory effort. No accessory muscle use.  Cardiovascular:  Huston FoleyBrady rate and rhythm, no murmurs / rubs / gallops. No extremity edema. 2+ pedal pulses. No carotid bruits.  Abdomen: no tenderness, no masses palpated. No hepatosplenomegaly. Bowel sounds positive.  Musculoskeletal: no clubbing / cyanosis. No joint deformity upper and lower extremities. Good ROM, no contractures. Normal muscle tone.  Skin: no rashes, lesions, ulcers.  Neurologic: CN 2-12 grossly intact. Sensation intact, DTR normal. Strength 5/5 in all 4.  Psychiatric: Normal judgment and insight. Alert and oriented x 3. Normal mood.     Labs on Admission: I have personally reviewed following labs and imaging studies  CBC:  Recent Labs Lab 12/05/16 1328  WBC 5.4  NEUTROABS 3.1  HGB 16.1  HCT 44.9  MCV 90.2  PLT 221    Basic Metabolic Panel:  Recent Labs Lab 12/05/16 1504  NA 142  K 3.3*  CL 108  CO2 28  GLUCOSE 87  BUN 9  CREATININE 1.02  CALCIUM 8.8*    GFR: CrCl cannot be calculated (Unknown ideal weight.).  Liver Function Tests:  Recent Labs Lab 12/05/16 1504  AST 30  ALT 24  ALKPHOS 47  BILITOT 1.2  PROT 6.7  ALBUMIN 3.7   No results for input(s): LIPASE, AMYLASE in the last 168 hours. No results for input(s): AMMONIA in the last 168 hours.  Coagulation Profile: No results for input(s): INR, PROTIME in the last 168 hours.  Cardiac Enzymes: No results for input(s): CKTOTAL, CKMB, CKMBINDEX, TROPONINI in the last 168 hours.  BNP (last  3 results) No results for input(s): PROBNP in the last 8760 hours.  HbA1C: No results for input(s): HGBA1C in the last 72 hours.  CBG: No results for input(s): GLUCAP in the last 168 hours.  Lipid Profile: No results for input(s): CHOL, HDL, LDLCALC, TRIG, CHOLHDL, LDLDIRECT in the last 72 hours.  Thyroid Function Tests: No results for input(s): TSH, T4TOTAL, FREET4, T3FREE, THYROIDAB in the last 72 hours.  Anemia Panel: No results for input(s): VITAMINB12, FOLATE, FERRITIN, TIBC, IRON, RETICCTPCT in the last 72 hours.  Urine analysis:    Component Value Date/Time   COLORURINE  YELLOW 04/10/2010 1712   APPEARANCEUR CLEAR 04/10/2010 1712   LABSPEC 1.011 04/10/2010 1712   PHURINE 7.5 04/10/2010 1712   GLUCOSEU NEGATIVE 04/10/2010 1712   HGBUR TRACE (A) 04/10/2010 1712   BILIRUBINUR NEGATIVE 04/10/2010 1712   KETONESUR NEGATIVE 04/10/2010 1712   PROTEINUR NEGATIVE 04/10/2010 1712   UROBILINOGEN 0.2 04/10/2010 1712   NITRITE NEGATIVE 04/10/2010 1712   LEUKOCYTESUR NEGATIVE 04/10/2010 1712    Sepsis Labs: @LABRCNTIP (procalcitonin:4,lacticidven:4) )No results found for this or any previous visit (from the past 240 hour(s)).   Radiological Exams on Admission: Dg Chest 2 View  Result Date: 12/05/2016 CLINICAL DATA:  Left side chest pain, shortness of Breath EXAM: CHEST  2 VIEW COMPARISON:  04/10/2010 FINDINGS: Cardiomediastinal silhouette is stable. Mild elevation of the right hemidiaphragm. No infiltrate or pleural effusion. No pulmonary edema. Osteopenia and mild degenerative changes thoracic spine. IMPRESSION: No active cardiopulmonary disease. Electronically Signed   By: Natasha MeadLiviu  Pop M.D.   On: 12/05/2016 14:52    EKG: Independently reviewed.  Assessment/Plan Active Problems:   Chest pain   Hypothyroidism   Hemorrhagic disorder due to intrinsic circulating anticoagulants (HCC)   Essential hypertension   Aneurysm of thoracic aorta (HCC)   Heartburn  Chest pain  syndrome, cardiac versus musculoskeletal vs anxiety. H/o PE, hypothyroidism, thoracic aortic aneurysm.  HEART score 3 . Troponin 0, EKG with bradycardia without evidence of ACS. CP unrelieved by nitroglycerin, received ASA CXR unrevealing. Last 2-D echo was with ejection fraction 55 to 60%, normal systolic function, and grade 1 DD  Admit to Telemetry/ Observation Chest pain order set Cycle troponins EKG in am continue ASA, O2 and NTG as needed Statins  GI cocktail Check Lipid panel  Hb A1C Patient to follow with Cards as OP, scheduled for imaging studies by Dr. [name]  Start Lexapro fr possible anxiety and Protonix  For GI symptoms  if symptoms worsen, or troponins increase, then will request cardiology evaluation.   Hypertension BP 159/92   Pulse  54  Continue home anti-hypertensive medications  Add Hydralazine Q6 hours as needed for BP 160/90   Hyperlipidemia Continue home statins     DVT prophylaxis: SCDs due to hemorrhagic disorder due to anticoagulants  Code Status:   Full   Family Communication:  Discussed with patient Disposition Plan: Expect patient to be discharged to home after condition improves Consults called:    None Admission status:Tele  Obs    Marcus Rorie E, PA-C [facility]   12/05/2016, 4:44 PM

## 2016-12-05 NOTE — ED Triage Notes (Signed)
Pt arrives from home via [facility]. Pt states he has been having intermittent centralized chest pressure since Thursday with mild SOB. Pt states he is supposed to have a MRI for his enlarged aortic valve, but was unable to tolerate the MRI x2. Pt received 324mg  of ASA and 2 nitro PTA.

## 2016-12-05 NOTE — ED Provider Notes (Signed)
[facility]-EMERGENCY DEPT Provider Note   CSN: [id number] Arrival date & time: 12/05/16  1321     History   Chief Complaint Chief Complaint  Patient presents with  . Chest Pain    HPI Volney AmericanSteven F Latulippe is a 50 y.o. male who presents with chest pain. PMH significant for HTN, HLD, bicuspid aortic valve, thoracic aortic aneurysm, hx of PE not on chronic anticoagulation. Cardiologist is Dr. [name]. He states he was supposed to have a MRI to evaluated his thoracic aortic aneurysm 2 days ago. When he went in to the scanner he became SOB and was unable to complete the study. This happened twice on two separate days. He started to have chest pain soon after on Thursday. It is on the left side of his chest and non-radiating. Feels like a pressure/tightness. It is intermittent but waxes and wanes in severity. Pain is currently 2/10. Reports intermittent numbness/tingling in the left arm. Denies fever, chills, diaphoresis, palpitations, SOB, wheezing, cough, abdominal pain, N/V. Nothing has made it better or worse. He states this pain is different from the pain he had from a PE. He does lift objects at work, sometimes up to 50#. His BP is normally between 120-130/80-90 at home. Today his BP was 180/100 on arrival of EMS. He has a stress test in the past but has been many years. Cannot recall if he's ever had a cath. Echo in 2014 showed EF 55-60%.  HPI  Past Medical History:  Diagnosis Date  . ABDOMINAL PAIN-EPIGASTRIC   . COAGULOPATHY, COUMADIN-INDUCED   . DYSPHAGIA OROPHARYNGEAL PHASE   . Heartburn   . HYPERLIPIDEMIA   . HYPERTENSION   . HYPERTENSION NEC   . HYPOTHYROIDISM   . PE   . Primary hypercoagulable state (HCC)   . Thoracic aneurysm without mention of rupture   . VASCULITIS     Patient Active Problem List   Diagnosis Date Noted  . Bicuspid aortic valve 10/17/2013  . PE 06/16/2010  . COAGULOPATHY, COUMADIN-INDUCED 04/07/2010  . PRIMARY HYPERCOAGULABLE STATE 04/07/2010  . CHEST  PAIN UNSPECIFIED 04/07/2010  . VASCULITIS 01/30/2010  . Nonspecific (abnormal) findings on radiological and other examination of body structure 01/15/2010  . CT, CHEST, ABNORMAL 01/15/2010  . Aneurysm of thoracic aorta (HCC) 01/14/2010  . HEARTBURN 01/14/2010  . DYSPHAGIA 01/14/2010  . ABDOMINAL PAIN-EPIGASTRIC 01/14/2010  . HYPOTHYROIDISM 01/06/2010  . HYPERLIPIDEMIA 01/06/2010  . Essential hypertension 01/06/2010  . LOSS OF WEIGHT 01/06/2010  . DYSPHAGIA OROPHARYNGEAL PHASE 01/06/2010  . DYSPHAGIA UNSPECIFIED 12/31/2009  . HYPERTENSION NEC 12/31/2009    History reviewed. No pertinent surgical history.     Home Medications    Prior to Admission medications   Medication Sig Start Date End Date Taking? Authorizing Provider  aspirin 81 MG tablet Take 81 mg by mouth daily.    Historical Provider, MD  levothyroxine (SYNTHROID, LEVOTHROID) 175 MCG tablet Take 175 mcg by mouth daily before breakfast.    Historical Provider, MD  losartan (COZAAR) 100 MG tablet Take 100 mg by mouth daily. 09/10/14   Historical Provider, MD  metoprolol succinate (TOPROL-XL) 50 MG 24 hr tablet Take 50 mg by mouth daily. 09/11/14   Historical Provider, MD    Family History Family History  Problem Relation Age of Onset  . Heart disease Mother   . Hypertension Father     Social History Social History  Substance Use Topics  . Smoking status: Never Smoker  . Smokeless tobacco: Never Used  . Alcohol use No  Allergies   Latex   Review of Systems Review of Systems  Constitutional: Negative for chills, diaphoresis and fever.  Respiratory: Negative for cough, shortness of breath and wheezing.   Cardiovascular: Positive for chest pain. Negative for palpitations and leg swelling.  Gastrointestinal: Negative for abdominal pain, nausea and vomiting.  All other systems reviewed and are negative.  Physical Exam Updated Vital Signs BP 159/92   Pulse (!) 54   Temp 98.3 F (36.8 C) (Oral)   Resp  11   SpO2 100%   Physical Exam  Constitutional: He is oriented to person, place, and time. He appears well-developed and well-nourished. No distress.  Mildly anxious, NAD  HENT:  Head: Normocephalic and atraumatic.  Eyes: Conjunctivae are normal. Pupils are equal, round, and reactive to light. Right eye exhibits no discharge. Left eye exhibits no discharge. No scleral icterus.  Neck: Normal range of motion.  Cardiovascular: Regular rhythm.  Bradycardia present.  Exam reveals no gallop and no friction rub.   No murmur heard. Pulmonary/Chest: Effort normal and breath sounds normal. No respiratory distress. He has no wheezes. He has no rales. He exhibits tenderness.  Mild tenderness to left side of chest wall  Abdominal: Soft. Bowel sounds are normal. He exhibits no distension and no mass. There is tenderness. There is no rebound and no guarding. No hernia.  Mild LUQ tenderness  Neurological: He is alert and oriented to person, place, and time.  Skin: Skin is warm and dry.  Psychiatric: His speech is normal and behavior is normal. His mood appears anxious.  Nursing note and vitals reviewed.    ED Treatments / Results  Labs (all labs ordered are listed, but only abnormal results are displayed) Labs Reviewed  COMPREHENSIVE METABOLIC PANEL - Abnormal; Notable for the following:       Result Value   Potassium 3.3 (*)    Calcium 8.8 (*)    All other components within normal limits  CBC WITH DIFFERENTIAL/PLATELET  Rosezena SensorI-STAT TROPOININ, ED  I-STAT TROPOININ, ED    EKG  EKG Interpretation  Date/Time:  Saturday December 05 2016 13:24:16 EST Ventricular Rate:  54 PR Interval:    QRS Duration: 105 QT Interval:  413 QTC Calculation: 392 R Axis:   3 Text Interpretation:  Sinus rhythm Incomplete right bundle branch block No significant change was found Confirmed by CAMPOS  MD, KEVIN ([id number]) on 12/05/2016 3:50:08 PM       Radiology Dg Chest 2 View  Result Date: 12/05/2016 CLINICAL  DATA:  Left side chest pain, shortness of Breath EXAM: CHEST  2 VIEW COMPARISON:  04/10/2010 FINDINGS: Cardiomediastinal silhouette is stable. Mild elevation of the right hemidiaphragm. No infiltrate or pleural effusion. No pulmonary edema. Osteopenia and mild degenerative changes thoracic spine. IMPRESSION: No active cardiopulmonary disease. Electronically Signed   By: Natasha MeadLiviu  Pop M.D.   On: 12/05/2016 14:52    Procedures Procedures (including critical care time)  Medications Ordered in ED Medications  aspirin EC tablet 81 mg (81 mg Oral Given 12/06/16 0855)  levothyroxine (SYNTHROID, LEVOTHROID) tablet 175 mcg (175 mcg Oral Given 12/06/16 0736)  metoprolol succinate (TOPROL-XL) 24 hr tablet 50 mg (50 mg Oral Not Given 12/05/16 1700)  losartan (COZAAR) tablet 100 mg (100 mg Oral Given 12/06/16 0855)  morphine 4 MG/ML injection 1 mg (not administered)  gi cocktail (Maalox,Lidocaine,Donnatal) (not administered)  acetaminophen (TYLENOL) tablet 650 mg (650 mg Oral Given 12/06/16 0551)  ondansetron (ZOFRAN) injection 4 mg (not administered)  escitalopram (LEXAPRO) tablet 20 mg (  20 mg Oral Given 12/06/16 0855)  pantoprazole (PROTONIX) EC tablet 40 mg (40 mg Oral Given 12/06/16 0550)  hydrALAZINE (APRESOLINE) injection 5-10 mg (10 mg Intravenous Given 12/06/16 0741)  potassium chloride SA (K-DUR,KLOR-CON) CR tablet 40 mEq (40 mEq Oral Given 12/06/16 0849)  regadenoson (LEXISCAN) injection SOLN 0.4 mg (not administered)  nitroGLYCERIN (NITROSTAT) SL tablet 0.4 mg (0.4 mg Sublingual Given 12/06/16 1122)  nitroGLYCERIN (NITROSTAT) 0.4 MG SL tablet (not administered)  LORazepam (ATIVAN) 2 MG/ML injection (not administered)  technetium tetrofosmin (TC-MYOVIEW) injection 10 millicurie (10 millicuries Intravenous Contrast Given 12/06/16 1035)     Initial Impression / Assessment and Plan / ED Course  I have reviewed the triage vital signs and the nursing notes.  Pertinent labs & imaging results that  were available during my care of the patient were reviewed by me and considered in my medical decision making (see chart for details).  Clinical Course    50 year old male with atypical chest pain. Pain does not seem consistent with PE or dissection. Chest pain work up thus far is reassuring. EKG is sinus bradycardia. CXR is negative. Initial troponin is 0. Labs are unremarkable. He is notably hypertensive even after taking his BP meds. Shared visit with Dr. Patria Mane. Patient does have some typical features of chest pain and atypical as well. Possible anxiety component. Will admit for CP r/o. Spoke with hospitalist service who agrees to admit to obs tele. Appreciate assistance.  Final Clinical Impressions(s) / ED Diagnoses   Final diagnoses:  Nonspecific chest pain    New Prescriptions New Prescriptions   No medications on file     Bethel Born, PA-C 12/06/16 1238    Azalia Bilis, MD 12/06/16 1600

## 2016-12-06 ENCOUNTER — Observation Stay (HOSPITAL_COMMUNITY): Payer: Managed Care, Other (non HMO)

## 2016-12-06 DIAGNOSIS — E876 Hypokalemia: Secondary | ICD-10-CM

## 2016-12-06 DIAGNOSIS — I200 Unstable angina: Secondary | ICD-10-CM

## 2016-12-06 DIAGNOSIS — R079 Chest pain, unspecified: Secondary | ICD-10-CM

## 2016-12-06 DIAGNOSIS — Q231 Congenital insufficiency of aortic valve: Secondary | ICD-10-CM

## 2016-12-06 DIAGNOSIS — I712 Thoracic aortic aneurysm, without rupture: Secondary | ICD-10-CM | POA: Diagnosis not present

## 2016-12-06 DIAGNOSIS — E034 Atrophy of thyroid (acquired): Secondary | ICD-10-CM

## 2016-12-06 DIAGNOSIS — I10 Essential (primary) hypertension: Secondary | ICD-10-CM | POA: Diagnosis not present

## 2016-12-06 LAB — COMPREHENSIVE METABOLIC PANEL
ALBUMIN: 3.3 g/dL — AB (ref 3.5–5.0)
ALK PHOS: 47 U/L (ref 38–126)
ALT: 23 U/L (ref 17–63)
ANION GAP: 9 (ref 5–15)
AST: 28 U/L (ref 15–41)
BILIRUBIN TOTAL: 0.9 mg/dL (ref 0.3–1.2)
BUN: 10 mg/dL (ref 6–20)
CALCIUM: 9.3 mg/dL (ref 8.9–10.3)
CO2: 27 mmol/L (ref 22–32)
CREATININE: 1.04 mg/dL (ref 0.61–1.24)
Chloride: 107 mmol/L (ref 101–111)
GFR calc Af Amer: >60 mL/min (ref >60)
GFR calc non Af Amer: >60 mL/min (ref >60)
GLUCOSE: 98 mg/dL (ref 65–99)
Potassium: 2.8 mmol/L — ABNORMAL LOW (ref 3.5–5.1)
Sodium: 143 mmol/L (ref 135–145)
TOTAL PROTEIN: 6.4 g/dL — AB (ref 6.5–8.1)

## 2016-12-06 LAB — CBC
HEMATOCRIT: 43.4 % (ref 39.0–52.0)
HEMOGLOBIN: 15.5 g/dL (ref 13.0–17.0)
MCH: 32.6 pg (ref 26.0–34.0)
MCHC: 35.7 g/dL (ref 30.0–36.0)
MCV: 91.4 fL (ref 78.0–100.0)
Platelets: 214 10*3/uL (ref 150–400)
RBC: 4.75 MIL/uL (ref 4.22–5.81)
RDW: 13.4 % (ref 11.5–15.5)
WBC: 7.2 10*3/uL (ref 4.0–10.5)

## 2016-12-06 LAB — NM MYOCAR MULTI W/SPECT W/WALL MOTION / EF
Peak HR: 110 {beats}/min
Rest HR: 92 {beats}/min

## 2016-12-06 LAB — MAGNESIUM: Magnesium: 2.1 mg/dL (ref 1.7–2.4)

## 2016-12-06 MED ORDER — POTASSIUM CHLORIDE CRYS ER 20 MEQ PO TBCR
40.0000 meq | EXTENDED_RELEASE_TABLET | ORAL | Status: AC
  Administered 2016-12-06 (×2): 40 meq via ORAL
  Filled 2016-12-06 (×2): qty 2

## 2016-12-06 MED ORDER — NITROGLYCERIN 0.4 MG SL SUBL
SUBLINGUAL_TABLET | SUBLINGUAL | Status: AC
  Filled 2016-12-06: qty 1

## 2016-12-06 MED ORDER — LORAZEPAM 2 MG/ML IJ SOLN
INTRAMUSCULAR | Status: AC
  Administered 2016-12-06: 1 mg via INTRAVENOUS
  Filled 2016-12-06: qty 1

## 2016-12-06 MED ORDER — TECHNETIUM TC 99M TETROFOSMIN IV KIT
30.0000 | PACK | Freq: Once | INTRAVENOUS | Status: AC | PRN
  Administered 2016-12-06: 30 via INTRAVENOUS

## 2016-12-06 MED ORDER — LORAZEPAM 2 MG/ML IJ SOLN
1.0000 mg | Freq: Once | INTRAMUSCULAR | Status: AC
  Administered 2016-12-06: 1 mg via INTRAVENOUS

## 2016-12-06 MED ORDER — NITROGLYCERIN 0.4 MG SL SUBL
0.4000 mg | SUBLINGUAL_TABLET | SUBLINGUAL | Status: DC | PRN
  Administered 2016-12-06: 0.4 mg via SUBLINGUAL

## 2016-12-06 MED ORDER — TECHNETIUM TC 99M TETROFOSMIN IV KIT
10.0000 | PACK | Freq: Once | INTRAVENOUS | Status: AC | PRN
  Administered 2016-12-06: 10 via INTRAVENOUS

## 2016-12-06 MED ORDER — REGADENOSON 0.4 MG/5ML IV SOLN
INTRAVENOUS | Status: AC
  Filled 2016-12-06: qty 5

## 2016-12-06 MED ORDER — SALINE SPRAY 0.65 % NA SOLN
1.0000 | NASAL | Status: DC | PRN
  Administered 2016-12-07: 1 via NASAL
  Filled 2016-12-06 (×2): qty 44

## 2016-12-06 MED ORDER — REGADENOSON 0.4 MG/5ML IV SOLN
0.4000 mg | Freq: Once | INTRAVENOUS | Status: AC
  Administered 2016-12-06: 0.4 mg via INTRAVENOUS
  Filled 2016-12-06: qty 5

## 2016-12-06 NOTE — Progress Notes (Signed)
At 1105 developed left chest tightness,lightheadedness, and SOB just as being put under camera. As lightheaded was placed on stretcher. See VS. Color pink and skin warm and dry. Philomena CourseK Thompson PA called and 1 nitro was given at 1122. Post nitro became very anxious with previous symptoms plus tingling in hands and feet, need to get out of bed. Was also pale, diaphoretic and stated need to have BM. BP now 93/57. Accompanied patient into bathroom and Eddie Nuc med tech remained with him. Tingling in hands and feet worsened. States has had panic attacks before and was unable to have MRI a week or so ago due to claustrophobia. Philomena CourseK Thompson PA aware of this and VS. Ativan given at 1240 and 1200, Symptoms eased and resolved, more relaxed and was able to tolerate being under the camera. During Lexiscan portion did have some nausea which resolved quickly. Patient's nurse on 3W was called and informed of above events.

## 2016-12-06 NOTE — Progress Notes (Signed)
PROGRESS NOTE    [name]  NWG:[id number] DOB: 04/26/1966 DOA: 12/05/2016  PCP: Duane LopeAlan Ross, MD   History of present illness/Brief Narrative:  [name] is a 50 y.o. male hypertension, hyperlipidemia, hypothyroidism, history of thoracic aortic aneurysm, history of RLL  PE not on chronic anticoagulation, remote history of vasculitis who is being followed as an outpatient by. [name] last seen about a year ago.  The patient was supposed to have an MRI to be evaluated for his thoracic aortic aneurysm 2 days ago, He became very short of breath and unable to complete the study after he presented for that film. These had already happened twice in 2 separate days. He began to have chest pain soon after, on Thursday. The pain is in the left side, nonradiating, feels like a pressure tightness type, is intermittent, but waxes and wanes in severity. Currently, the pain is too out of 10. The patient reports intermittent tingling in the left arm, but denies any Joe pain. He denies any fever or chills diaphoresis palpitation shortness of breath wheezing cough abdominal pain nausea or vomiting. Nothing makes it better or worse. He reports the pain being different than the one he had from the PE. The patient does lift objects at work, sometimes up to 50 pounds. In addition, he is scheduled for a stress test next week by cardiology as outpatient. He denies any sick contacts. He does not smoke. No alcohol or recreational drug use. No recent long distance trips. No Viagra. Very stressed at work   Subjective: The patient tells me he is been having on and off chest pain associated with shortness of breath rest for a few weeks now. He does not really exert himself and unable to tell if he has exertional issues. No other associated symptoms with his pain.  Assessment & Plan:   Principal Problem:   Chest pain -Atypical symptoms-discussed with cardiology on-call and recommended to order an exercise stress  test- -Nuclear images- resulted and is showing possible inferior wall ischemia-obtaining cardiology consult -Continue aspirin - Obtain fasting lipid panel in a.m.  Active Problems:   Hypothyroidism -Synthroid  hypokalemia - relaced-magnesium normal-recheck in a.m.    Essential hypertension -Losartan - metoprolol-held today for stress test    Aneurysm of thoracic aorta /   Bicuspid aortic valve - followed as outpt by Dr [name]     DVT prophylaxis: SCDs Code Status: Full code Family Communication: None Disposition Plan: Cardiology consult requested Consultants:   None Procedures:   Stress test Antimicrobials:  Anti-infectives    None       Objective: Vitals:   12/06/16 1256 12/06/16 1258 12/06/16 1300 12/06/16 1633  BP: 130/74 136/73 (!) 155/79 140/86  Pulse:      Resp:      Temp:    98.3 F (36.8 C)  TempSrc:    Oral  SpO2:    99%  Weight:      Height:        Intake/Output Summary (Last 24 hours) at 12/06/16 1757 Last data filed at 12/06/16 1408  Gross per 24 hour  Intake              222 ml  Output                0 ml  Net              222 ml   Filed Weights   12/05/16 1713 12/06/16 0532  Weight: 103.1 kg (227  lb 4.7 oz) 102.8 kg (226 lb 11.2 oz)    Examination: General exam: Appears comfortable  HEENT: PERRLA, oral mucosa moist, no sclera icterus or thrush Respiratory system: Clear to auscultation. Respiratory effort normal. Cardiovascular system: S1 & S2 heard, RRR.  No murmurs  Gastrointestinal system: Abdomen soft, non-tender, nondistended. Normal bowel sound. No organomegaly Central nervous system: Alert and oriented. No focal neurological deficits. Extremities: No cyanosis, clubbing or edema Skin: No rashes or ulcers Psychiatry:  Mood & affect appropriate.     Data Reviewed: I have personally reviewed following labs and imaging studies  CBC:  Recent Labs Lab 12/05/16 1328 12/06/16 0421  WBC 5.4 7.2  NEUTROABS 3.1  --     HGB 16.1 15.5  HCT 44.9 43.4  MCV 90.2 91.4  PLT 221 214   Basic Metabolic Panel:  Recent Labs Lab 12/05/16 1504 12/06/16 0421 12/06/16 0855  NA 142 143  --   K 3.3* 2.8*  --   CL 108 107  --   CO2 28 27  --   GLUCOSE 87 98  --   BUN 9 10  --   CREATININE 1.02 1.04  --   CALCIUM 8.8* 9.3  --   MG  --   --  2.1   GFR: Estimated Creatinine Clearance: 112.8 mL/min (by C-G formula based on SCr of 1.04 mg/dL). Liver Function Tests:  Recent Labs Lab 12/05/16 1504 12/06/16 0421  AST 30 28  ALT 24 23  ALKPHOS 47 47  BILITOT 1.2 0.9  PROT 6.7 6.4*  ALBUMIN 3.7 3.3*   No results for input(s): LIPASE, AMYLASE in the last 168 hours. No results for input(s): AMMONIA in the last 168 hours. Coagulation Profile: No results for input(s): INR, PROTIME in the last 168 hours. Cardiac Enzymes:  Recent Labs Lab 12/05/16 1718 12/05/16 1929 12/05/16 2256  TROPONINI <0.03 <0.03 <0.03   BNP (last 3 results) No results for input(s): PROBNP in the last 8760 hours. HbA1C: No results for input(s): HGBA1C in the last 72 hours. CBG: No results for input(s): GLUCAP in the last 168 hours. Lipid Profile: No results for input(s): CHOL, HDL, LDLCALC, TRIG, CHOLHDL, LDLDIRECT in the last 72 hours. Thyroid Function Tests: No results for input(s): TSH, T4TOTAL, FREET4, T3FREE, THYROIDAB in the last 72 hours. Anemia Panel: No results for input(s): VITAMINB12, FOLATE, FERRITIN, TIBC, IRON, RETICCTPCT in the last 72 hours. Urine analysis:    Component Value Date/Time   COLORURINE YELLOW 04/10/2010 1712   APPEARANCEUR CLEAR 04/10/2010 1712   LABSPEC 1.011 04/10/2010 1712   PHURINE 7.5 04/10/2010 1712   GLUCOSEU NEGATIVE 04/10/2010 1712   HGBUR TRACE (A) 04/10/2010 1712   BILIRUBINUR NEGATIVE 04/10/2010 1712   KETONESUR NEGATIVE 04/10/2010 1712   PROTEINUR NEGATIVE 04/10/2010 1712   UROBILINOGEN 0.2 04/10/2010 1712   NITRITE NEGATIVE 04/10/2010 1712   LEUKOCYTESUR NEGATIVE 04/10/2010  1712   Sepsis Labs: @LABRCNTIP (procalcitonin:4,lacticidven:4) )No results found for this or any previous visit (from the past 240 hour(s)).       Radiology Studies: Dg Chest 2 View  Result Date: 12/05/2016 CLINICAL DATA:  Left side chest pain, shortness of Breath EXAM: CHEST  2 VIEW COMPARISON:  04/10/2010 FINDINGS: Cardiomediastinal silhouette is stable. Mild elevation of the right hemidiaphragm. No infiltrate or pleural effusion. No pulmonary edema. Osteopenia and mild degenerative changes thoracic spine. IMPRESSION: No active cardiopulmonary disease. Electronically Signed   By: Natasha Mead M.D.   On: 12/05/2016 14:52   Nm Myocar Multi W/spect W/wall Motion /  Ef  Result Date: 12/06/2016 CLINICAL DATA:  Chest pain, hypertension, hyperlipidemia and history of thoracic aortic aneurysm. EXAM: MYOCARDIAL IMAGING WITH SPECT (REST AND PHARMACOLOGIC-STRESS) GATED LEFT VENTRICULAR WALL MOTION STUDY LEFT VENTRICULAR EJECTION FRACTION TECHNIQUE: Standard myocardial SPECT imaging was performed after resting intravenous injection of 10 mCi Tc-6337m tetrofosmin. Subsequently, intravenous infusion of Lexiscan was performed under the supervision of the Cardiology staff. At peak effect of the drug, 30 mCi Tc-3237m tetrofosmin was injected intravenously and standard myocardial SPECT imaging was performed. Quantitative gated imaging was also performed to evaluate left ventricular wall motion, and estimate left ventricular ejection fraction. COMPARISON:  None. FINDINGS: Perfusion: There is some relative diminished perfusion of the inferior wall on the stress acquisition compared to the rest acquisition. Although this may be reflective of ischemia, it does appear that bowel activity encroaches upon the inferior wall on the raw planar images and this may be artifactual from adjacent diaphragmatic and bowel attenuation. Wall Motion: Normal left ventricular wall motion. No left ventricular dilation. Left Ventricular  Ejection Fraction: 64 % End diastolic volume 88 ml End systolic volume 32 ml IMPRESSION: 1. Cannot exclude inferior wall ischemia. It is felt that the inferior wall findings on the stress acquisition may be secondary to encroachment of the diaphragm and bowel activity on the inferior wall based on review of the raw planar images. 2. Normal left ventricular wall motion. 3. Left ventricular ejection fraction 64% 4. Non invasive risk stratification*: Intermediate *2012 Appropriate Use Criteria for Coronary Revascularization Focused Update: J Am Coll Cardiol. 2012;59(9):857-881. http://content.dementiazones.comonlinejacc.org/article.aspx?articleid=1201161 Electronically Signed   By: Irish LackGlenn  Yamagata M.D.   On: 12/06/2016 17:11      Scheduled Meds: . aspirin EC  81 mg Oral Daily  . escitalopram  20 mg Oral Daily  . levothyroxine  175 mcg Oral QAC breakfast  . losartan  100 mg Oral Daily  . metoprolol succinate  50 mg Oral Daily  . pantoprazole  40 mg Oral Q0600   Continuous Infusions:   LOS: 0 days    Time spent in minutes: 35    Shoshanah Dapper, MD [facility] Pager: [email or web address] Password [email or web address] 12/06/2016, 5:57 PM

## 2016-12-06 NOTE — Progress Notes (Signed)
Patient compaining of lightheaded, SOB,left chest tightness 5/10. See VS. Color pink and skin warm and dry. Philomena CourseK Thompson paged to notify. In Nuclear Med for stress test.

## 2016-12-06 NOTE — Progress Notes (Signed)
Refused bed alarm. Will continue to monitor patient. 

## 2016-12-06 NOTE — Progress Notes (Signed)
MD on call notified of pt's potassium being 2.8 this morning with this morning labs. Will continue to monitor the pt. Sanda LingerMilam, Lyndzie Zentz R, RN

## 2016-12-06 NOTE — Progress Notes (Signed)
     The patient was seen in nuclear medicine for a lexiscan myoview. He tolerated the procedure well. No acute ST or TW changes on ECG. Await nuclear images.    Ricard Faulkner Stern PA-C  [facility]    

## 2016-12-07 DIAGNOSIS — Z79899 Other long term (current) drug therapy: Secondary | ICD-10-CM | POA: Diagnosis not present

## 2016-12-07 DIAGNOSIS — I10 Essential (primary) hypertension: Secondary | ICD-10-CM

## 2016-12-07 DIAGNOSIS — Q231 Congenital insufficiency of aortic valve: Secondary | ICD-10-CM | POA: Diagnosis not present

## 2016-12-07 DIAGNOSIS — I200 Unstable angina: Secondary | ICD-10-CM | POA: Diagnosis not present

## 2016-12-07 DIAGNOSIS — R072 Precordial pain: Secondary | ICD-10-CM | POA: Diagnosis not present

## 2016-12-07 DIAGNOSIS — R0789 Other chest pain: Secondary | ICD-10-CM | POA: Diagnosis present

## 2016-12-07 DIAGNOSIS — Z8249 Family history of ischemic heart disease and other diseases of the circulatory system: Secondary | ICD-10-CM | POA: Diagnosis not present

## 2016-12-07 DIAGNOSIS — E876 Hypokalemia: Secondary | ICD-10-CM | POA: Diagnosis present

## 2016-12-07 DIAGNOSIS — R1312 Dysphagia, oropharyngeal phase: Secondary | ICD-10-CM | POA: Diagnosis present

## 2016-12-07 DIAGNOSIS — R079 Chest pain, unspecified: Secondary | ICD-10-CM | POA: Diagnosis present

## 2016-12-07 DIAGNOSIS — Z7982 Long term (current) use of aspirin: Secondary | ICD-10-CM | POA: Diagnosis not present

## 2016-12-07 DIAGNOSIS — E039 Hypothyroidism, unspecified: Secondary | ICD-10-CM | POA: Diagnosis present

## 2016-12-07 DIAGNOSIS — R9439 Abnormal result of other cardiovascular function study: Secondary | ICD-10-CM | POA: Diagnosis not present

## 2016-12-07 DIAGNOSIS — Z86711 Personal history of pulmonary embolism: Secondary | ICD-10-CM | POA: Diagnosis not present

## 2016-12-07 DIAGNOSIS — Z91040 Latex allergy status: Secondary | ICD-10-CM | POA: Diagnosis not present

## 2016-12-07 DIAGNOSIS — D68318 Other hemorrhagic disorder due to intrinsic circulating anticoagulants, antibodies, or inhibitors: Secondary | ICD-10-CM | POA: Diagnosis present

## 2016-12-07 DIAGNOSIS — I712 Thoracic aortic aneurysm, without rupture: Secondary | ICD-10-CM | POA: Diagnosis not present

## 2016-12-07 DIAGNOSIS — F419 Anxiety disorder, unspecified: Secondary | ICD-10-CM | POA: Diagnosis present

## 2016-12-07 DIAGNOSIS — E785 Hyperlipidemia, unspecified: Secondary | ICD-10-CM | POA: Diagnosis present

## 2016-12-07 LAB — BASIC METABOLIC PANEL
ANION GAP: 8 (ref 5–15)
BUN: 17 mg/dL (ref 6–20)
CALCIUM: 9 mg/dL (ref 8.9–10.3)
CO2: 23 mmol/L (ref 22–32)
Chloride: 108 mmol/L (ref 101–111)
Creatinine, Ser: 1.1 mg/dL (ref 0.61–1.24)
GLUCOSE: 105 mg/dL — AB (ref 65–99)
POTASSIUM: 3.5 mmol/L (ref 3.5–5.1)
SODIUM: 139 mmol/L (ref 135–145)

## 2016-12-07 LAB — LIPID PANEL
CHOL/HDL RATIO: 3.3 ratio
CHOLESTEROL: 140 mg/dL (ref 0–200)
HDL: 43 mg/dL (ref >40)
LDL Cholesterol: 87 mg/dL (ref 0–99)
TRIGLYCERIDES: 51 mg/dL (ref <150)
VLDL: 10 mg/dL (ref 0–40)

## 2016-12-07 MED ORDER — ASPIRIN 81 MG PO CHEW
81.0000 mg | CHEWABLE_TABLET | ORAL | Status: AC
  Administered 2016-12-08: 81 mg via ORAL
  Filled 2016-12-07: qty 1

## 2016-12-07 MED ORDER — SODIUM CHLORIDE 0.9% FLUSH: 3.0000 mL | INTRAVENOUS | Status: DC | PRN

## 2016-12-07 MED ORDER — SODIUM CHLORIDE 0.9 % IV SOLN: 250.0000 mL | INTRAVENOUS | Status: DC | PRN

## 2016-12-07 MED ORDER — SODIUM CHLORIDE 0.9 % WEIGHT BASED INFUSION
1.0000 mL/kg/h | INTRAVENOUS | Status: DC
  Administered 2016-12-07: 1 mL/kg/h via INTRAVENOUS

## 2016-12-07 MED ORDER — SODIUM CHLORIDE 0.9% FLUSH
3.0000 mL | Freq: Two times a day (BID) | INTRAVENOUS | Status: DC
  Administered 2016-12-07: 3 mL via INTRAVENOUS

## 2016-12-07 NOTE — Progress Notes (Signed)
PROGRESS NOTE    [name]  ZOX:[id number] DOB: 06/06/1966 DOA: 12/05/2016  PCP: Duane LopeAlan Ross, MD   History of present illness/Brief Narrative:  [name] is a 50 y.o. male hypertension, hyperlipidemia, hypothyroidism, horacic aortic aneurysm, bicuspid aortic valve, R lower lobe PE (not on chronic anticoagulation), hypercoagulable state, dysphagia,? TIA, hyperthyroidism and remote hx of vasculitis who admitted for chest pain, followed as an outpatient by. [name] last seen about a year ago.  The patient being followed by Dr. Abe PeopleVaransi for thoracic aortic aneurysm. Last cardiac MRI 11/2015 showed increased aneusym to 4.5cm from 4.3cm in 2013. He became very short of breath and unable to complete the study after he presented for that film. These had already happened twice in 2 separate days. Cardiology consulted    Subjective:  on and off chest pain associated with shortness of breath rest for a few weeks now.    Assessment & Plan:    Chest pain -Atypical symptoms-discussed with cardiology on-call and recommended  exercise stress test- Troponin negative x 3. EKG without acute changes. Echo with normal EF and mild focal LVH. Stress test intermediate risk, can not exclude ischemia. Seems his symptoms is more anxiety related.   Keep NPO  In am  .Plan cardiac catheterization tomorrow. -Continue aspirin LDL 87      Hypothyroidism -Synthroid  hypokalemia - relaced-magnesium normal     Essential hypertension -Losartan - metoprolol-held today for stress test    Aneurysm of thoracic aorta /   Bicuspid aortic valve - followed as outpt by Dr [name] 4.5cm on last study 11/2015. He is due to repeat test patient will need to have CTA or MRA of thoracic aorta to further assess. This can be performed as an outpatient   DVT prophylaxis: SCDs Code Status: Full code Family Communication: None Disposition Plan:  As per Cardiology   Consultants:   None Procedures:   Stress  test Antimicrobials:  Anti-infectives    None       Objective: Vitals:   12/06/16 2344 12/07/16 0434 12/07/16 0752 12/07/16 1032  BP: 117/76 137/81 121/80 (!) 153/96  Pulse: 89 72 71 77  Resp:   16   Temp: 98.4 F (36.9 C) 98.2 F (36.8 C) 98.1 F (36.7 C)   TempSrc: Oral Oral Oral   SpO2: 98% 97% 98%   Weight:  101.7 kg (224 lb 3.2 oz)    Height:        Intake/Output Summary (Last 24 hours) at 12/07/16 1134 Last data filed at 12/07/16 0900  Gross per 24 hour  Intake              582 ml  Output                0 ml  Net              582 ml   Filed Weights   12/05/16 1713 12/06/16 0532 12/07/16 0434  Weight: 103.1 kg (227 lb 4.7 oz) 102.8 kg (226 lb 11.2 oz) 101.7 kg (224 lb 3.2 oz)    Examination: General exam: Appears comfortable  HEENT: PERRLA, oral mucosa moist, no sclera icterus or thrush Respiratory system: Clear to auscultation. Respiratory effort normal. Cardiovascular system: S1 & S2 heard, RRR.  No murmurs  Gastrointestinal system: Abdomen soft, non-tender, nondistended. Normal bowel sound. No organomegaly Central nervous system: Alert and oriented. No focal neurological deficits. Extremities: No cyanosis, clubbing or edema Skin: No rashes or ulcers Psychiatry:  Mood & affect  appropriate.     Data Reviewed: I have personally reviewed following labs and imaging studies  CBC:  Recent Labs Lab 12/05/16 1328 12/06/16 0421  WBC 5.4 7.2  NEUTROABS 3.1  --   HGB 16.1 15.5  HCT 44.9 43.4  MCV 90.2 91.4  PLT 221 214   Basic Metabolic Panel:  Recent Labs Lab 12/05/16 1504 12/06/16 0421 12/06/16 0855 12/07/16 0247  NA 142 143  --  139  K 3.3* 2.8*  --  3.5  CL 108 107  --  108  CO2 28 27  --  23  GLUCOSE 87 98  --  105*  BUN 9 10  --  17  CREATININE 1.02 1.04  --  1.10  CALCIUM 8.8* 9.3  --  9.0  MG  --   --  2.1  --    GFR: Estimated Creatinine Clearance: 106.2 mL/min (by C-G formula based on SCr of 1.1 mg/dL). Liver Function  Tests:  Recent Labs Lab 12/05/16 1504 12/06/16 0421  AST 30 28  ALT 24 23  ALKPHOS 47 47  BILITOT 1.2 0.9  PROT 6.7 6.4*  ALBUMIN 3.7 3.3*   No results for input(s): LIPASE, AMYLASE in the last 168 hours. No results for input(s): AMMONIA in the last 168 hours. Coagulation Profile: No results for input(s): INR, PROTIME in the last 168 hours. Cardiac Enzymes:  Recent Labs Lab 12/05/16 1718 12/05/16 1929 12/05/16 2256  TROPONINI <0.03 <0.03 <0.03   BNP (last 3 results) No results for input(s): PROBNP in the last 8760 hours. HbA1C: No results for input(s): HGBA1C in the last 72 hours. CBG: No results for input(s): GLUCAP in the last 168 hours. Lipid Profile:  Recent Labs  12/07/16 0247  CHOL 140  HDL 43  LDLCALC 87  TRIG 51  CHOLHDL 3.3   Thyroid Function Tests: No results for input(s): TSH, T4TOTAL, FREET4, T3FREE, THYROIDAB in the last 72 hours. Anemia Panel: No results for input(s): VITAMINB12, FOLATE, FERRITIN, TIBC, IRON, RETICCTPCT in the last 72 hours. Urine analysis:    Component Value Date/Time   COLORURINE YELLOW 04/10/2010 1712   APPEARANCEUR CLEAR 04/10/2010 1712   LABSPEC 1.011 04/10/2010 1712   PHURINE 7.5 04/10/2010 1712   GLUCOSEU NEGATIVE 04/10/2010 1712   HGBUR TRACE (A) 04/10/2010 1712   BILIRUBINUR NEGATIVE 04/10/2010 1712   KETONESUR NEGATIVE 04/10/2010 1712   PROTEINUR NEGATIVE 04/10/2010 1712   UROBILINOGEN 0.2 04/10/2010 1712   NITRITE NEGATIVE 04/10/2010 1712   LEUKOCYTESUR NEGATIVE 04/10/2010 1712   Sepsis Labs: @LABRCNTIP (procalcitonin:4,lacticidven:4) )No results found for this or any previous visit (from the past 240 hour(s)).       Radiology Studies: Dg Chest 2 View  Result Date: 12/05/2016 CLINICAL DATA:  Left side chest pain, shortness of Breath EXAM: CHEST  2 VIEW COMPARISON:  04/10/2010 FINDINGS: Cardiomediastinal silhouette is stable. Mild elevation of the right hemidiaphragm. No infiltrate or pleural effusion.  No pulmonary edema. Osteopenia and mild degenerative changes thoracic spine. IMPRESSION: No active cardiopulmonary disease. Electronically Signed   By: Natasha MeadLiviu  Pop M.D.   On: 12/05/2016 14:52   Nm Myocar Multi W/spect W/wall Motion / Ef  Result Date: 12/06/2016 CLINICAL DATA:  Chest pain, hypertension, hyperlipidemia and history of thoracic aortic aneurysm. EXAM: MYOCARDIAL IMAGING WITH SPECT (REST AND PHARMACOLOGIC-STRESS) GATED LEFT VENTRICULAR WALL MOTION STUDY LEFT VENTRICULAR EJECTION FRACTION TECHNIQUE: Standard myocardial SPECT imaging was performed after resting intravenous injection of 10 mCi Tc-4957m tetrofosmin. Subsequently, intravenous infusion of Lexiscan was performed under the supervision of  the Cardiology staff. At peak effect of the drug, 30 mCi Tc-39m tetrofosmin was injected intravenously and standard myocardial SPECT imaging was performed. Quantitative gated imaging was also performed to evaluate left ventricular wall motion, and estimate left ventricular ejection fraction. COMPARISON:  None. FINDINGS: Perfusion: There is some relative diminished perfusion of the inferior wall on the stress acquisition compared to the rest acquisition. Although this may be reflective of ischemia, it does appear that bowel activity encroaches upon the inferior wall on the raw planar images and this may be artifactual from adjacent diaphragmatic and bowel attenuation. Wall Motion: Normal left ventricular wall motion. No left ventricular dilation. Left Ventricular Ejection Fraction: 64 % End diastolic volume 88 ml End systolic volume 32 ml IMPRESSION: 1. Cannot exclude inferior wall ischemia. It is felt that the inferior wall findings on the stress acquisition may be secondary to encroachment of the diaphragm and bowel activity on the inferior wall based on review of the raw planar images. 2. Normal left ventricular wall motion. 3. Left ventricular ejection fraction 64% 4. Non invasive risk stratification*:  Intermediate *2012 Appropriate Use Criteria for Coronary Revascularization Focused Update: J Am Coll Cardiol. 2012;59(9):857-881. http://content.dementiazones.com.aspx?articleid=[email or web address] Electronically Signed   By: Irish Lack M.D.   On: 12/06/2016 17:11      Scheduled Meds: . aspirin EC  81 mg Oral Daily  . escitalopram  20 mg Oral Daily  . levothyroxine  175 mcg Oral QAC breakfast  . losartan  100 mg Oral Daily  . metoprolol succinate  50 mg Oral Daily  . pantoprazole  40 mg Oral Q0600   Continuous Infusions:   LOS: 0 days    Time spent in minutes: 35    Henson Fraticelli, MD [facility] Pager: [email or web address] Password [facility] 12/07/2016, 11:34 AM

## 2016-12-07 NOTE — Consult Note (Signed)
CARDIOLOGY CONSULT NOTE   Patient ID: [name] MRN: [id number] DOB/AGE: 48/02/1966 48 y.o.  Admit date: 12/05/2016  Primary Physician   Duane LopeAlan Ross, MD Primary Cardiologist   Dr. [name] Reason for Consultation   Abnormal stress test Requesting Physician  Dr.   HPI: [name] is a 50 y.o. male with a history of HTN, HLD, thoracic aortic aneurysm, bicuspid aortic valve, R lower lobe PE (not on chronic anticoagulation), hypercoagulable state, dysphagia,? TIA, hyperthyroidism and remote hx of vasculitis who admitted for chest pain.  The patient being followed by Dr. Abe PeopleVaransi for thoracic aortic aneurysm. Last cardiac MRI 11/2015 showed increased aneusym to 4.5cm from 4.3cm in 2013.  Few days ago, he went for routine cardiac MRI, before starting test he became very short of breath and had chest pain. May be had anxiety. This happed when attaching electrodes. He was not under camera. Study was cancelled and similar episode next day leading to cancel it again. He again has left sided chest pain describes as achy/pressure at home while sitting. Lasted for few hours. No radiation or nausea. EMS was called and given SL nitro x 2 and ASA. His chest pain did not improved but gave him headache. This pain is different from his PE pain. At home his BP runs in 130/90s. No prior similar episode. Does lift heavy objects up to 50 lb.   Up on arrival to ER his BP was 180/100. He was admitted for rule out. EKG showed sinus rhythm at rate of 51bpm. Troponin x 3 negative. LDL 87. supplement given for hypokalemia, now resolved. Echo this admission showed LV EF of 55-605, mild focal basal hypertrophy of the septum. No WM abnormality. No AS. Prior to stress test yesterday he became anxious followed by chest pain and dyspnea. Given SL nitro without improvement. His symptoms eventually resolved after ativan. This episode is similar to prior episode leading to cancellation of cardiac MRI. Stress test is  intermediate risk study with EF of 64%. Cannot exclude inferior wall ischemia. It is felt that the inferior wall findings on the stress acquisition may be secondary to encroachment of the diaphragm and bowel activity on the inferior wall based on review of the raw planar images.  Cardiology asked for further evaluation.   Past Medical History:  Diagnosis Date  . ABDOMINAL PAIN-EPIGASTRIC   . COAGULOPATHY, COUMADIN-INDUCED   . DYSPHAGIA OROPHARYNGEAL PHASE   . Heartburn   . HYPERLIPIDEMIA   . HYPERTENSION   . HYPERTENSION NEC   . HYPOTHYROIDISM   . PE   . Primary hypercoagulable state (HCC)   . Thoracic aneurysm without mention of rupture   . VASCULITIS      History reviewed. No pertinent surgical history.  Allergies  Allergen Reactions  . Latex Rash    I have reviewed the patient's current medications . aspirin EC  81 mg Oral Daily  . escitalopram  20 mg Oral Daily  . levothyroxine  175 mcg Oral QAC breakfast  . losartan  100 mg Oral Daily  . metoprolol succinate  50 mg Oral Daily  . pantoprazole  40 mg Oral Q0600    acetaminophen, gi cocktail, hydrALAZINE, morphine injection, nitroGLYCERIN, ondansetron (ZOFRAN) IV, sodium chloride  Prior to Admission medications   Medication Sig Start Date End Date Taking? Authorizing Provider  aspirin 81 MG tablet Take 81 mg by mouth daily.   Yes Historical Provider, MD  levothyroxine (SYNTHROID, LEVOTHROID) 175 MCG tablet Take 175 mcg by mouth daily before  breakfast.   Yes Historical Provider, MD  losartan (COZAAR) 100 MG tablet Take 100 mg by mouth daily. 09/10/14  Yes Historical Provider, MD  metoprolol succinate (TOPROL-XL) 50 MG 24 hr tablet Take 50 mg by mouth daily. 09/11/14  Yes Historical Provider, MD     Social History   Social History  . Marital status: Single    Spouse name: N/A  . Number of children: N/A  . Years of education: N/A   Occupational History  . Not on file.   Social History Main Topics  . Smoking  status: Never Smoker  . Smokeless tobacco: Never Used  . Alcohol use No  . Drug use: No  . Sexual activity: Not on file   Other Topics Concern  . Not on file   Social History Narrative  . No narrative on file    Family Status  Relation Status  . Mother Alive  . Father Alive   Family History  Problem Relation Age of Onset  . Heart disease Mother   . Hypertension Father       ROS:  Full 14 point review of systems complete and found to be negative unless listed above.  Physical Exam: Blood pressure 137/81, pulse 72, temperature 98.2 F (36.8 C), temperature source Oral, resp. rate 18, height 6\' 3"  (1.905 m), weight 224 lb 3.2 oz (101.7 kg), SpO2 97 %.  General: Well developed, well nourished, male in no acute distress Head: Eyes PERRLA, No xanthomas. Normocephalic and atraumatic, oropharynx without edema or exudate.  Lungs: Resp regular and unlabored, CTA. Heart: RRR no s3, s4, or murmurs..   Neck: No carotid bruits. No lymphadenopathy. No  JVD. Abdomen: Bowel sounds present, abdomen soft and non-tender without masses or hernias noted. Msk:  No spine or cva tenderness. No weakness, no joint deformities or effusions. Extremities: No clubbing, cyanosis or edema. DP/PT/Radials 2+ and equal bilaterally. Neuro: Alert and oriented X 3. No focal deficits noted. ? Right facial droop.Pt patient his been chronic.  Psych:  Good affect, responds appropriately Skin: No rashes or lesions noted.  Labs:   Lab Results  Component Value Date   WBC 7.2 12/06/2016   HGB 15.5 12/06/2016   HCT 43.4 12/06/2016   MCV 91.4 12/06/2016   PLT 214 12/06/2016   No results for input(s): INR in the last 72 hours.  Recent Labs Lab 12/06/16 0421 12/07/16 0247  NA 143 139  K 2.8* 3.5  CL 107 108  CO2 27 23  BUN 10 17  CREATININE 1.04 1.10  CALCIUM 9.3 9.0  PROT 6.4*  --   BILITOT 0.9  --   ALKPHOS 47  --   ALT 23  --   AST 28  --   GLUCOSE 98 105*  ALBUMIN 3.3*  --    Magnesium  Date  Value Ref Range Status  12/06/2016 2.1 1.7 - 2.4 mg/dL Final    Recent Labs  60/45/40 1718 12/05/16 1929 12/05/16 2256  TROPONINI <0.03 <0.03 <0.03    Recent Labs  12/05/16 1407  TROPIPOC 0.00   Pro B Natriuretic peptide (BNP)  Date/Time Value Ref Range Status  01/19/2010 04:55 AM <30.0 0.0 - 100.0 pg/mL Final   Lab Results  Component Value Date   CHOL 140 12/07/2016   HDL 43 12/07/2016   LDLCALC 87 12/07/2016   TRIG 51 12/07/2016   No results found for: DDIMER Lipase  Date/Time Value Ref Range Status  04/10/2010 05:08 PM 43 11 - 59 U/L Final  Vitamin B-12  Date/Time Value Ref Range Status  04/11/2010 08:52 AM 501 211 - 911 pg/mL Final    Echo: 12/06/16 LV EF: 55% -   60%  ------------------------------------------------------------------- History:   PMH:   Chest pain.  Risk factors:  Hypertension. Dyslipidemia.  ------------------------------------------------------------------- Study Conclusions  - Left ventricle: The cavity size was normal. There was mild focal   basal hypertrophy of the septum. Systolic function was normal.   The estimated ejection fraction was in the range of 55% to 60%.   Wall motion was normal; there were no regional wall motion   abnormalities. Left ventricular diastolic function parameters   were normal. - Atrial septum: No defect or patent foramen ovale was identified.   Stress test 12/06/16 IMPRESSION: 1. Cannot exclude inferior wall ischemia. It is felt that the inferior wall findings on the stress acquisition may be secondary to encroachment of the diaphragm and bowel activity on the inferior wall based on review of the raw planar images.  2. Normal left ventricular wall motion.  3. Left ventricular ejection fraction 64%  4. Non invasive risk stratification*: Intermediate  FINDINGS: Limited images of the lung fields showed no significant abnormalities.  Normal left ventricular size with mild focal basal  septal hypertrophy. There was turbulent flow adjacent to the basal septum in the LV outflow tract. Normal wall motion. EF 59%. Normal right ventricular size and systolic function. The aortic valve was bicuspid. There was no significant regurgitation or stenosis. No significant mitral regurgitation. Normal right and left atrial sizes.  On delayed enhancement imaging, there was no myocardial late gadolinium enhancement (LGE).  On MR angiography, the ascending aorta was dilated to 4.5 cm. The aortic arch was normal in caliber. The arch vessels originated normally. The pulmonary veins drained normally to the left atrium.  MR Card Morphology Wo/W Cm  11/2015  MEASUREMENTS: MEASUREMENTS LVEDV 138 mL  LV SV 81 mL  LV EF 59%  Aortic root 3.9 cm  Ascending aorta at maximal diameter 4.5 cm  Aortic arch 2.6 cm  Descending thoracic aorta 2.0 cm  IMPRESSION: 1. Normal left ventricular size and systolic function, EF 59%, with mild focal basal septal hypertrophy.  2. Bicuspid aortic valve with no significant stenosis or regurgitation.  3.  Ascending aortic aneurysm measuring 4.5 cm.   Radiology:  Dg Chest 2 View  Result Date: 12/05/2016 CLINICAL DATA:  Left side chest pain, shortness of Breath EXAM: CHEST  2 VIEW COMPARISON:  04/10/2010 FINDINGS: Cardiomediastinal silhouette is stable. Mild elevation of the right hemidiaphragm. No infiltrate or pleural effusion. No pulmonary edema. Osteopenia and mild degenerative changes thoracic spine. IMPRESSION: No active cardiopulmonary disease. Electronically Signed   By: Natasha MeadLiviu  Pop M.D.   On: 12/05/2016 14:52   Nm Myocar Multi W/spect W/wall Motion / Ef  Result Date: 12/06/2016 CLINICAL DATA:  Chest pain, hypertension, hyperlipidemia and history of thoracic aortic aneurysm. EXAM: MYOCARDIAL IMAGING WITH SPECT (REST AND PHARMACOLOGIC-STRESS) GATED LEFT VENTRICULAR WALL MOTION STUDY LEFT VENTRICULAR EJECTION FRACTION TECHNIQUE:  Standard myocardial SPECT imaging was performed after resting intravenous injection of 10 mCi Tc-3549m tetrofosmin. Subsequently, intravenous infusion of Lexiscan was performed under the supervision of the Cardiology staff. At peak effect of the drug, 30 mCi Tc-7049m tetrofosmin was injected intravenously and standard myocardial SPECT imaging was performed. Quantitative gated imaging was also performed to evaluate left ventricular wall motion, and estimate left ventricular ejection fraction. COMPARISON:  None. FINDINGS: Perfusion: There is some relative diminished perfusion of the inferior wall on the  stress acquisition compared to the rest acquisition. Although this may be reflective of ischemia, it does appear that bowel activity encroaches upon the inferior wall on the raw planar images and this may be artifactual from adjacent diaphragmatic and bowel attenuation. Wall Motion: Normal left ventricular wall motion. No left ventricular dilation. Left Ventricular Ejection Fraction: 64 % End diastolic volume 88 ml End systolic volume 32 ml IMPRESSION: 1. Cannot exclude inferior wall ischemia. It is felt that the inferior wall findings on the stress acquisition may be secondary to encroachment of the diaphragm and bowel activity on the inferior wall based on review of the raw planar images. 2. Normal left ventricular wall motion. 3. Left ventricular ejection fraction 64% 4. Non invasive risk stratification*: Intermediate *2012 Appropriate Use Criteria for Coronary Revascularization Focused Update: J Am Coll Cardiol. 2012;59(9):857-881. http://[email or web address]?[email or web address] Electronically Signed   By: Irish Lack M.D.   On: 12/06/2016 17:11    ASSESSMENT AND PLAN:      1. Chest pain - The patient has been ruled out. Troponin negative x 3. EKG without acute changes. Echo with normal EF and mild focal LVH. Stress test intermediate risk, can not exclude ischemia. Seems his symptoms is more  anxiety related. Will review with MD. Keep NPO.  2.  Aneurysm of thoracic aorta (HCC) - 4.5cm on last study 11/2015. He is due to repeat test. Cancelled as above.  Recently elevated high blood pressure. Pulse equal both arm . No sharing pain. Recently lifting heavy stuff.   3. Bicuspid aortic valve - No stenosis. No syncope.   4. Hypertension - Recently uncontrolled. Upon presentation it was >180/100. Now stable.    SignedManson Passey, PA 12/07/2016, 7:50 AM Pager [phone]  Co-Sign MD  As above, patient seen and examined. Briefly he is a 50 year old male with past medical history of hypertension, hyperlipidemia, bicuspid aortic valve, thoracic aortic aneurysm, right lower lobe pulmonary embolus for evaluation of chest pain. Had anxiety when he presented for repeat MRA of thoracic aorta last week. He developed chest pressure without radiation that was substernal. He had some dyspnea but no other associated symptoms. Lasted several hours and resolved. Presently pain free. Nuclear study showed inferior ischemia.   1 chest pain-symptoms are atypical. Electrocardiogram shows sinus rhythm, left ventricular hypertrophy and no ST changes. Enzymes negative. Nuclear study shows inferior ischemia. He will need definitive evaluation given recurrence of symptoms. Plan cardiac catheterization tomorrow. The risks and benefits including myocardial infarction, CVA and death discussed and he agrees to proceed.  2 thoracic aortic aneurysm-patient will need to have CTA or MRA of thoracic aorta to further assess. This can be performed as an outpatient. He may need anxiolytic prior to proceeding.  3 bicuspid aortic valve  4 hypertension-blood pressure controlled. Continue present medications.  Olga Millers, MD

## 2016-12-07 NOTE — Progress Notes (Signed)
Patient refused bed alarm. Will continue to monitor patient. 

## 2016-12-07 NOTE — Research (Signed)
[facility] Informed Consent   Subject Name: [name]  Subject met inclusion and exclusion criteria.  The informed consent form, study requirements and expectations were reviewed with the subject and questions and concerns were addressed prior to the signing of the consent form.  The subject verbalized understanding of the trail requirements.  The subject agreed to participate in the CADLAD trial and signed the informed consent.  The informed consent was obtained prior to performance of any protocol-specific procedures for the subject.  A copy of the signed informed consent was given to the subject and a copy was placed in the subject's medical record.  Sandie Ano 12/07/2016, 13:50

## 2016-12-08 ENCOUNTER — Other Ambulatory Visit: Payer: Self-pay | Admitting: Cardiology

## 2016-12-08 ENCOUNTER — Encounter (HOSPITAL_COMMUNITY)
Admission: EM | Disposition: A | Discharge status: Home / Self Care | Payer: Self-pay | Source: Ambulatory Visit | Attending: Internal Medicine

## 2016-12-08 ENCOUNTER — Encounter (HOSPITAL_COMMUNITY): Payer: Self-pay | Admitting: Cardiovascular Disease

## 2016-12-08 DIAGNOSIS — R9439 Abnormal result of other cardiovascular function study: Secondary | ICD-10-CM

## 2016-12-08 DIAGNOSIS — I200 Unstable angina: Secondary | ICD-10-CM

## 2016-12-08 DIAGNOSIS — R079 Chest pain, unspecified: Secondary | ICD-10-CM

## 2016-12-08 DIAGNOSIS — I712 Thoracic aortic aneurysm, without rupture, unspecified: Secondary | ICD-10-CM

## 2016-12-08 HISTORY — PX: CARDIAC CATHETERIZATION: SHX172

## 2016-12-08 LAB — PROTIME-INR
INR: 1.14
Prothrombin Time: 14.7 seconds (ref 11.4–15.2)

## 2016-12-08 SURGERY — LEFT HEART CATH AND CORONARY ANGIOGRAPHY

## 2016-12-08 MED ORDER — LIDOCAINE HCL (PF) 1 % IJ SOLN
INTRAMUSCULAR | Status: DC | PRN
  Administered 2016-12-08: 2 mL

## 2016-12-08 MED ORDER — FENTANYL CITRATE (PF) 100 MCG/2ML IJ SOLN
INTRAMUSCULAR | Status: DC | PRN
  Administered 2016-12-08: 50 ug via INTRAVENOUS

## 2016-12-08 MED ORDER — DIAZEPAM 5 MG PO TABS
5.0000 mg | ORAL_TABLET | Freq: Four times a day (QID) | ORAL | Status: DC | PRN
Start: 2016-12-08 — End: 2016-12-08

## 2016-12-08 MED ORDER — SODIUM CHLORIDE 0.9 % IV SOLN: 250.0000 mL | INTRAVENOUS | Status: DC | PRN

## 2016-12-08 MED ORDER — HEPARIN (PORCINE) IN NACL 2-0.9 UNIT/ML-% IJ SOLN
INTRAMUSCULAR | Status: AC
  Filled 2016-12-08: qty 1000

## 2016-12-08 MED ORDER — HEPARIN SODIUM (PORCINE) 1000 UNIT/ML IJ SOLN
INTRAMUSCULAR | Status: AC
  Filled 2016-12-08: qty 1

## 2016-12-08 MED ORDER — MIDAZOLAM HCL 2 MG/2ML IJ SOLN
INTRAMUSCULAR | Status: DC | PRN
  Administered 2016-12-08: 2 mg via INTRAVENOUS

## 2016-12-08 MED ORDER — HEPARIN (PORCINE) IN NACL 2-0.9 UNIT/ML-% IJ SOLN
INTRAMUSCULAR | Status: DC | PRN
  Administered 2016-12-08: 1000 mL

## 2016-12-08 MED ORDER — PANTOPRAZOLE SODIUM 40 MG PO TBEC: 40.0000 mg | DELAYED_RELEASE_TABLET | Freq: Every day | ORAL | 2 refills | Status: DC

## 2016-12-08 MED ORDER — SODIUM CHLORIDE 0.9% FLUSH: 3.0000 mL | INTRAVENOUS | Status: DC | PRN

## 2016-12-08 MED ORDER — FENTANYL CITRATE (PF) 100 MCG/2ML IJ SOLN
INTRAMUSCULAR | Status: AC
  Filled 2016-12-08: qty 2

## 2016-12-08 MED ORDER — IOPAMIDOL (ISOVUE-370) INJECTION 76%
INTRAVENOUS | Status: AC
  Filled 2016-12-08: qty 100

## 2016-12-08 MED ORDER — VERAPAMIL HCL 2.5 MG/ML IV SOLN
INTRAVENOUS | Status: DC | PRN
  Administered 2016-12-08: 10 mL via INTRA_ARTERIAL

## 2016-12-08 MED ORDER — SODIUM CHLORIDE 0.9% FLUSH: 3.0000 mL | Freq: Two times a day (BID) | INTRAVENOUS | Status: DC

## 2016-12-08 MED ORDER — HEPARIN SODIUM (PORCINE) 1000 UNIT/ML IJ SOLN
INTRAMUSCULAR | Status: DC | PRN
  Administered 2016-12-08: 5000 [IU] via INTRAVENOUS

## 2016-12-08 MED ORDER — SODIUM CHLORIDE 0.9 % IV SOLN: INTRAVENOUS | Status: AC

## 2016-12-08 MED ORDER — ACETAMINOPHEN 325 MG PO TABS: 650.0000 mg | ORAL_TABLET | ORAL | Status: DC | PRN

## 2016-12-08 MED ORDER — IOPAMIDOL (ISOVUE-370) INJECTION 76%
INTRAVENOUS | Status: DC | PRN
  Administered 2016-12-08: 70 mL via INTRA_ARTERIAL

## 2016-12-08 MED ORDER — MIDAZOLAM HCL 2 MG/2ML IJ SOLN
INTRAMUSCULAR | Status: AC
  Filled 2016-12-08: qty 2

## 2016-12-08 MED ORDER — ONDANSETRON HCL 4 MG/2ML IJ SOLN: 4.0000 mg | Freq: Four times a day (QID) | INTRAMUSCULAR | Status: DC | PRN

## 2016-12-08 MED ORDER — LIDOCAINE HCL (PF) 1 % IJ SOLN
INTRAMUSCULAR | Status: AC
  Filled 2016-12-08: qty 30

## 2016-12-08 MED ORDER — VERAPAMIL HCL 2.5 MG/ML IV SOLN
INTRAVENOUS | Status: AC
  Filled 2016-12-08: qty 2

## 2016-12-08 MED ORDER — INFLUENZA VAC SPLIT QUAD 0.5 ML IM SUSY
0.5000 mL | PREFILLED_SYRINGE | INTRAMUSCULAR | Status: AC
  Administered 2016-12-08: 0.5 mL via INTRAMUSCULAR
  Filled 2016-12-08: qty 0.5

## 2016-12-08 SURGICAL SUPPLY — 12 items
CATH EXPO 5FR ANG PIGTAIL 145 (CATHETERS) ×2 IMPLANT
CATH EXPO 5FR FR4 (CATHETERS) ×2 IMPLANT
CATH OPTITORQUE TIG 4.0 5F (CATHETERS) ×2 IMPLANT
DEVICE RAD COMP TR BAND LRG (VASCULAR PRODUCTS) ×2 IMPLANT
GLIDESHEATH SLEND SS 6F .021 (SHEATH) ×2 IMPLANT
GUIDEWIRE INQWIRE 1.5J.035X260 (WIRE) ×1 IMPLANT
INQWIRE 1.5J .035X260CM (WIRE) ×2
KIT HEART LEFT (KITS) ×2 IMPLANT
PACK CARDIAC CATHETERIZATION (CUSTOM PROCEDURE TRAY) ×2 IMPLANT
SYR MEDRAD MARK V 150ML (SYRINGE) ×2 IMPLANT
TRANSDUCER W/STOPCOCK (MISCELLANEOUS) ×2 IMPLANT
TUBING CIL FLEX 10 FLL-RA (TUBING) ×2 IMPLANT

## 2016-12-08 NOTE — H&P (View-Only) (Signed)
CARDIOLOGY CONSULT NOTE   Patient ID: Volney AmericanSteven F Wolak MRN: [id number] DOB/AGE: 48/02/1966 48 y.o.  Admit date: 12/05/2016  Primary Physician   Duane LopeAlan Ross, MD Primary Cardiologist   Dr. [name] Reason for Consultation   Abnormal stress test Requesting Physician  Dr.   HPI: Volney AmericanSteven F Mcvay is a 50 y.o. male with a history of HTN, HLD, thoracic aortic aneurysm, bicuspid aortic valve, R lower lobe PE (not on chronic anticoagulation), hypercoagulable state, dysphagia,? TIA, hyperthyroidism and remote hx of vasculitis who admitted for chest pain.  The patient being followed by Dr. Abe PeopleVaransi for thoracic aortic aneurysm. Last cardiac MRI 11/2015 showed increased aneusym to 4.5cm from 4.3cm in 2013.  Few days ago, he went for routine cardiac MRI, before starting test he became very short of breath and had chest pain. May be had anxiety. This happed when attaching electrodes. He was not under camera. Study was cancelled and similar episode next day leading to cancel it again. He again has left sided chest pain describes as achy/pressure at home while sitting. Lasted for few hours. No radiation or nausea. EMS was called and given SL nitro x 2 and ASA. His chest pain did not improved but gave him headache. This pain is different from his PE pain. At home his BP runs in 130/90s. No prior similar episode. Does lift heavy objects up to 50 lb.   Up on arrival to ER his BP was 180/100. He was admitted for rule out. EKG showed sinus rhythm at rate of 51bpm. Troponin x 3 negative. LDL 87. supplement given for hypokalemia, now resolved. Echo this admission showed LV EF of 55-605, mild focal basal hypertrophy of the septum. No WM abnormality. No AS. Prior to stress test yesterday he became anxious followed by chest pain and dyspnea. Given SL nitro without improvement. His symptoms eventually resolved after ativan. This episode is similar to prior episode leading to cancellation of cardiac MRI. Stress test is  intermediate risk study with EF of 64%. Cannot exclude inferior wall ischemia. It is felt that the inferior wall findings on the stress acquisition may be secondary to encroachment of the diaphragm and bowel activity on the inferior wall based on review of the raw planar images.  Cardiology asked for further evaluation.   Past Medical History:  Diagnosis Date  . ABDOMINAL PAIN-EPIGASTRIC   . COAGULOPATHY, COUMADIN-INDUCED   . DYSPHAGIA OROPHARYNGEAL PHASE   . Heartburn   . HYPERLIPIDEMIA   . HYPERTENSION   . HYPERTENSION NEC   . HYPOTHYROIDISM   . PE   . Primary hypercoagulable state (HCC)   . Thoracic aneurysm without mention of rupture   . VASCULITIS      History reviewed. No pertinent surgical history.  Allergies  Allergen Reactions  . Latex Rash    I have reviewed the patient's current medications . aspirin EC  81 mg Oral Daily  . escitalopram  20 mg Oral Daily  . levothyroxine  175 mcg Oral QAC breakfast  . losartan  100 mg Oral Daily  . metoprolol succinate  50 mg Oral Daily  . pantoprazole  40 mg Oral Q0600    acetaminophen, gi cocktail, hydrALAZINE, morphine injection, nitroGLYCERIN, ondansetron (ZOFRAN) IV, sodium chloride  Prior to Admission medications   Medication Sig Start Date End Date Taking? Authorizing Provider  aspirin 81 MG tablet Take 81 mg by mouth daily.   Yes Historical Provider, MD  levothyroxine (SYNTHROID, LEVOTHROID) 175 MCG tablet Take 175 mcg by mouth daily before  breakfast.   Yes Historical Provider, MD  losartan (COZAAR) 100 MG tablet Take 100 mg by mouth daily. 09/10/14  Yes Historical Provider, MD  metoprolol succinate (TOPROL-XL) 50 MG 24 hr tablet Take 50 mg by mouth daily. 09/11/14  Yes Historical Provider, MD     Social History   Social History  . Marital status: Single    Spouse name: N/A  . Number of children: N/A  . Years of education: N/A   Occupational History  . Not on file.   Social History Main Topics  . Smoking  status: Never Smoker  . Smokeless tobacco: Never Used  . Alcohol use No  . Drug use: No  . Sexual activity: Not on file   Other Topics Concern  . Not on file   Social History Narrative  . No narrative on file    Family Status  Relation Status  . Mother Alive  . Father Alive   Family History  Problem Relation Age of Onset  . Heart disease Mother   . Hypertension Father       ROS:  Full 14 point review of systems complete and found to be negative unless listed above.  Physical Exam: Blood pressure 137/81, pulse 72, temperature 98.2 F (36.8 C), temperature source Oral, resp. rate 18, height 6\' 3"  (1.905 m), weight 224 lb 3.2 oz (101.7 kg), SpO2 97 %.  General: Well developed, well nourished, male in no acute distress Head: Eyes PERRLA, No xanthomas. Normocephalic and atraumatic, oropharynx without edema or exudate.  Lungs: Resp regular and unlabored, CTA. Heart: RRR no s3, s4, or murmurs..   Neck: No carotid bruits. No lymphadenopathy. No  JVD. Abdomen: Bowel sounds present, abdomen soft and non-tender without masses or hernias noted. Msk:  No spine or cva tenderness. No weakness, no joint deformities or effusions. Extremities: No clubbing, cyanosis or edema. DP/PT/Radials 2+ and equal bilaterally. Neuro: Alert and oriented X 3. No focal deficits noted. ? Right facial droop.Pt patient his been chronic.  Psych:  Good affect, responds appropriately Skin: No rashes or lesions noted.  Labs:   Lab Results  Component Value Date   WBC 7.2 12/06/2016   HGB 15.5 12/06/2016   HCT 43.4 12/06/2016   MCV 91.4 12/06/2016   PLT 214 12/06/2016   No results for input(s): INR in the last 72 hours.  Recent Labs Lab 12/06/16 0421 12/07/16 0247  NA 143 139  K 2.8* 3.5  CL 107 108  CO2 27 23  BUN 10 17  CREATININE 1.04 1.10  CALCIUM 9.3 9.0  PROT 6.4*  --   BILITOT 0.9  --   ALKPHOS 47  --   ALT 23  --   AST 28  --   GLUCOSE 98 105*  ALBUMIN 3.3*  --    Magnesium  Date  Value Ref Range Status  12/06/2016 2.1 1.7 - 2.4 mg/dL Final    Recent Labs  60/45/40 1718 12/05/16 1929 12/05/16 2256  TROPONINI <0.03 <0.03 <0.03    Recent Labs  12/05/16 1407  TROPIPOC 0.00   Pro B Natriuretic peptide (BNP)  Date/Time Value Ref Range Status  01/19/2010 04:55 AM <30.0 0.0 - 100.0 pg/mL Final   Lab Results  Component Value Date   CHOL 140 12/07/2016   HDL 43 12/07/2016   LDLCALC 87 12/07/2016   TRIG 51 12/07/2016   No results found for: DDIMER Lipase  Date/Time Value Ref Range Status  04/10/2010 05:08 PM 43 11 - 59 U/L Final  Vitamin B-12  Date/Time Value Ref Range Status  04/11/2010 08:52 AM 501 211 - 911 pg/mL Final    Echo: 12/06/16 LV EF: 55% -   60%  ------------------------------------------------------------------- History:   PMH:   Chest pain.  Risk factors:  Hypertension. Dyslipidemia.  ------------------------------------------------------------------- Study Conclusions  - Left ventricle: The cavity size was normal. There was mild focal   basal hypertrophy of the septum. Systolic function was normal.   The estimated ejection fraction was in the range of 55% to 60%.   Wall motion was normal; there were no regional wall motion   abnormalities. Left ventricular diastolic function parameters   were normal. - Atrial septum: No defect or patent foramen ovale was identified.   Stress test 12/06/16 IMPRESSION: 1. Cannot exclude inferior wall ischemia. It is felt that the inferior wall findings on the stress acquisition may be secondary to encroachment of the diaphragm and bowel activity on the inferior wall based on review of the raw planar images.  2. Normal left ventricular wall motion.  3. Left ventricular ejection fraction 64%  4. Non invasive risk stratification*: Intermediate  FINDINGS: Limited images of the lung fields showed no significant abnormalities.  Normal left ventricular size with mild focal basal  septal hypertrophy. There was turbulent flow adjacent to the basal septum in the LV outflow tract. Normal wall motion. EF 59%. Normal right ventricular size and systolic function. The aortic valve was bicuspid. There was no significant regurgitation or stenosis. No significant mitral regurgitation. Normal right and left atrial sizes.  On delayed enhancement imaging, there was no myocardial late gadolinium enhancement (LGE).  On MR angiography, the ascending aorta was dilated to 4.5 cm. The aortic arch was normal in caliber. The arch vessels originated normally. The pulmonary veins drained normally to the left atrium.  MR Card Morphology Wo/W Cm  11/2015  MEASUREMENTS: MEASUREMENTS LVEDV 138 mL  LV SV 81 mL  LV EF 59%  Aortic root 3.9 cm  Ascending aorta at maximal diameter 4.5 cm  Aortic arch 2.6 cm  Descending thoracic aorta 2.0 cm  IMPRESSION: 1. Normal left ventricular size and systolic function, EF 59%, with mild focal basal septal hypertrophy.  2. Bicuspid aortic valve with no significant stenosis or regurgitation.  3.  Ascending aortic aneurysm measuring 4.5 cm.   Radiology:  Dg Chest 2 View  Result Date: 12/05/2016 CLINICAL DATA:  Left side chest pain, shortness of Breath EXAM: CHEST  2 VIEW COMPARISON:  04/10/2010 FINDINGS: Cardiomediastinal silhouette is stable. Mild elevation of the right hemidiaphragm. No infiltrate or pleural effusion. No pulmonary edema. Osteopenia and mild degenerative changes thoracic spine. IMPRESSION: No active cardiopulmonary disease. Electronically Signed   By: Natasha MeadLiviu  Pop M.D.   On: 12/05/2016 14:52   Nm Myocar Multi W/spect W/wall Motion / Ef  Result Date: 12/06/2016 CLINICAL DATA:  Chest pain, hypertension, hyperlipidemia and history of thoracic aortic aneurysm. EXAM: MYOCARDIAL IMAGING WITH SPECT (REST AND PHARMACOLOGIC-STRESS) GATED LEFT VENTRICULAR WALL MOTION STUDY LEFT VENTRICULAR EJECTION FRACTION TECHNIQUE:  Standard myocardial SPECT imaging was performed after resting intravenous injection of 10 mCi Tc-3549m tetrofosmin. Subsequently, intravenous infusion of Lexiscan was performed under the supervision of the Cardiology staff. At peak effect of the drug, 30 mCi Tc-7049m tetrofosmin was injected intravenously and standard myocardial SPECT imaging was performed. Quantitative gated imaging was also performed to evaluate left ventricular wall motion, and estimate left ventricular ejection fraction. COMPARISON:  None. FINDINGS: Perfusion: There is some relative diminished perfusion of the inferior wall on the  stress acquisition compared to the rest acquisition. Although this may be reflective of ischemia, it does appear that bowel activity encroaches upon the inferior wall on the raw planar images and this may be artifactual from adjacent diaphragmatic and bowel attenuation. Wall Motion: Normal left ventricular wall motion. No left ventricular dilation. Left Ventricular Ejection Fraction: 64 % End diastolic volume 88 ml End systolic volume 32 ml IMPRESSION: 1. Cannot exclude inferior wall ischemia. It is felt that the inferior wall findings on the stress acquisition may be secondary to encroachment of the diaphragm and bowel activity on the inferior wall based on review of the raw planar images. 2. Normal left ventricular wall motion. 3. Left ventricular ejection fraction 64% 4. Non invasive risk stratification*: Intermediate *2012 Appropriate Use Criteria for Coronary Revascularization Focused Update: J Am Coll Cardiol. 2012;59(9):857-881. http://content.dementiazones.com.aspx?articleid=1201161 Electronically Signed   By: Irish Lack M.D.   On: 12/06/2016 17:11    ASSESSMENT AND PLAN:      1. Chest pain - The patient has been ruled out. Troponin negative x 3. EKG without acute changes. Echo with normal EF and mild focal LVH. Stress test intermediate risk, can not exclude ischemia. Seems his symptoms is more  anxiety related. Will review with MD. Keep NPO.  2.  Aneurysm of thoracic aorta (HCC) - 4.5cm on last study 11/2015. He is due to repeat test. Cancelled as above.  Recently elevated high blood pressure. Pulse equal both arm . No sharing pain. Recently lifting heavy stuff.   3. Bicuspid aortic valve - No stenosis. No syncope.   4. Hypertension - Recently uncontrolled. Upon presentation it was >180/100. Now stable.    SignedManson Passey, PA 12/07/2016, 7:50 AM Pager [phone]  Co-Sign MD  As above, patient seen and examined. Briefly he is a 50 year old male with past medical history of hypertension, hyperlipidemia, bicuspid aortic valve, thoracic aortic aneurysm, right lower lobe pulmonary embolus for evaluation of chest pain. Had anxiety when he presented for repeat MRA of thoracic aorta last week. He developed chest pressure without radiation that was substernal. He had some dyspnea but no other associated symptoms. Lasted several hours and resolved. Presently pain free. Nuclear study showed inferior ischemia.   1 chest pain-symptoms are atypical. Electrocardiogram shows sinus rhythm, left ventricular hypertrophy and no ST changes. Enzymes negative. Nuclear study shows inferior ischemia. He will need definitive evaluation given recurrence of symptoms. Plan cardiac catheterization tomorrow. The risks and benefits including myocardial infarction, CVA and death discussed and he agrees to proceed.  2 thoracic aortic aneurysm-patient will need to have CTA or MRA of thoracic aorta to further assess. This can be performed as an outpatient. He may need anxiolytic prior to proceeding.  3 bicuspid aortic valve  4 hypertension-blood pressure controlled. Continue present medications.  Olga Millers, MD

## 2016-12-08 NOTE — Progress Notes (Signed)
Patient Name: [name] Date of Encounter: 12/08/2016  Primary Cardiologist[facility]  Hospital Problem List     Principal Problem:   Chest pain Active Problems:   Hypothyroidism   Hemorrhagic disorder due to intrinsic circulating anticoagulants [facility](HCC)   Essential hypertension   Aneurysm of thoracic aorta (HCC)   Heartburn   Bicuspid aortic valve   Abnormal nuclear stress test     Subjective   No chest pain or dyspnea  Inpatient Medications    Scheduled Meds: . aspirin EC  81 mg Oral Daily  . escitalopram  20 mg Oral Daily  . levothyroxine  175 mcg Oral QAC breakfast  . losartan  100 mg Oral Daily  . metoprolol succinate  50 mg Oral Daily  . pantoprazole  40 mg Oral Q0600  . sodium chloride flush  3 mL Intravenous Q12H   Continuous Infusions: . sodium chloride     PRN Meds: sodium chloride, acetaminophen, diazepam, gi cocktail, hydrALAZINE, morphine injection, nitroGLYCERIN, ondansetron (ZOFRAN) IV, sodium chloride, sodium chloride flush   Vital Signs    Vitals:   12/08/16 0816 12/08/16 0821 12/08/16 0826 12/08/16 0844  BP: (!) 152/87 (!) 142/84 (!) 154/95 (!) 171/101  Pulse: 65 72 (!) 57 63  Resp: 17 13 11 12   Temp:    97.8 F (36.6 C)  TempSrc:    Oral  SpO2: 98% 98% 99% 96%  Weight:      Height:        Intake/Output Summary (Last 24 hours) at 12/08/16 1018 Last data filed at 12/08/16 0256  Gross per 24 hour  Intake          1338.33 ml  Output                0 ml  Net          1338.33 ml   Filed Weights   12/06/16 0532 12/07/16 0434 12/08/16 0500  Weight: 226 lb 11.2 oz (102.8 kg) 224 lb 3.2 oz (101.7 kg) 224 lb 14.4 oz (102 kg)    Physical Exam   GEN: Well nourished, well developed, in no acute distress.  HEENT: Grossly normal.  Neck: Supple Cardiac: RRR Respiratory:  CTA GI: Soft, nontender, nondistended MS: no deformity or atrophy. Skin: warm and dry, no rash. Neuro:  Strength and sensation are intact.   Labs     CBC  Recent Labs  12/05/16 1328 12/06/16 0421  WBC 5.4 7.2  NEUTROABS 3.1  --   HGB 16.1 15.5  HCT 44.9 43.4  MCV 90.2 91.4  PLT 221 214   Basic Metabolic Panel  Recent Labs  12/06/16 0421 12/06/16 0855 12/07/16 0247  NA 143  --  139  K 2.8*  --  3.5  CL 107  --  108  CO2 27  --  23  GLUCOSE 98  --  105*  BUN 10  --  17  CREATININE 1.04  --  1.10  CALCIUM 9.3  --  9.0  MG  --  2.1  --    Liver Function Tests  Recent Labs  12/05/16 1504 12/06/16 0421  AST 30 28  ALT 24 23  ALKPHOS 47 47  BILITOT 1.2 0.9  PROT 6.7 6.4*  ALBUMIN 3.7 3.3*   Cardiac Enzymes  Recent Labs  12/05/16 1718 12/05/16 1929 12/05/16 2256  TROPONINI <0.03 <0.03 <0.03   Fasting Lipid Panel  Recent Labs  12/07/16 0247  CHOL 140  HDL 43  LDLCALC  87  TRIG 51  CHOLHDL 3.3     Telemetry    Sinus - Personally Reviewed    Radiology    Nm Myocar Multi W/spect W/wall Motion / Ef  Result Date: 12/06/2016 CLINICAL DATA:  Chest pain, hypertension, hyperlipidemia and history of thoracic aortic aneurysm. EXAM: MYOCARDIAL IMAGING WITH SPECT (REST AND PHARMACOLOGIC-STRESS) GATED LEFT VENTRICULAR WALL MOTION STUDY LEFT VENTRICULAR EJECTION FRACTION TECHNIQUE: Standard myocardial SPECT imaging was performed after resting intravenous injection of 10 mCi Tc-5559m tetrofosmin. Subsequently, intravenous infusion of Lexiscan was performed under the supervision of the Cardiology staff. At peak effect of the drug, 30 mCi Tc-4959m tetrofosmin was injected intravenously and standard myocardial SPECT imaging was performed. Quantitative gated imaging was also performed to evaluate left ventricular wall motion, and estimate left ventricular ejection fraction. COMPARISON:  None. FINDINGS: Perfusion: There is some relative diminished perfusion of the inferior wall on the stress acquisition compared to the rest acquisition. Although this may be reflective of ischemia, it does appear that bowel activity  encroaches upon the inferior wall on the raw planar images and this may be artifactual from adjacent diaphragmatic and bowel attenuation. Wall Motion: Normal left ventricular wall motion. No left ventricular dilation. Left Ventricular Ejection Fraction: 64 % End diastolic volume 88 ml End systolic volume 32 ml IMPRESSION: 1. Cannot exclude inferior wall ischemia. It is felt that the inferior wall findings on the stress acquisition may be secondary to encroachment of the diaphragm and bowel activity on the inferior wall based on review of the raw planar images. 2. Normal left ventricular wall motion. 3. Left ventricular ejection fraction 64% 4. Non invasive risk stratification*: Intermediate *2012 Appropriate Use Criteria for Coronary Revascularization Focused Update: J Am Coll Cardiol. 2012;59(9):857-881. http://content.dementiazones.comonlinejacc.org/article.aspx?articleid=1201161 Electronically Signed   By: Irish LackGlenn  Yamagata M.D.   On: 12/06/2016 17:11     Patient Profile     [age] year old male with past medical history of hypertension, hyperlipidemia, bicuspid aortic valve, thoracic aortic aneurysm, right lower lobe pulmonary embolus for evaluation of chest pain. Had anxiety when he presented for repeat MRA of thoracic aorta last week. He developed chest pressure without radiation that was substernal. He had some dyspnea but no other associated symptoms. Lasted several hours and resolved. Presently pain free. Nuclear study showed inferior ischemia. Cath shows no CAD.  Assessment & Plan    1 chest pain-Cath shows no CAD; no further WU.  2 thoracic aortic aneurysm-patient will need to have CTA or MRA of thoracic aorta to further assess. This can be performed as an outpatient. He may need anxiolytic prior to proceeding.  3 bicuspid aortic valve  4 hypertension-blood pressure mildly elevated. Continue present medications. Follow as outpt and adjust regimen as needed.  We will arrange outpt CTA; we will sign off;  please call with questions. FU with Dr [name] following DC.  Signed, Olga MillersBrian Jazari Ober, MD  12/08/2016, 10:18 AM

## 2016-12-08 NOTE — Interval H&P Note (Signed)
Cath Lab Visit (complete for each Cath Lab visit)  Clinical Evaluation Leading to the Procedure:   ACS: No.  Non-ACS:    Anginal Classification: CCS III  Anti-ischemic medical therapy: Minimal Therapy (1 class of medications)  Non-Invasive Test Results: Intermediate-risk stress test findings: cardiac mortality 1-3%/year  Prior CABG: No previous CABG      History and Physical Interval Note:  12/08/2016 7:47 AM  [name]  has presented today for surgery, with the diagnosis of unstable angina  The various methods of treatment have been discussed with the patient and family. After consideration of risks, benefits and other options for treatment, the patient has consented to  Procedure(s): Left Heart Cath and Coronary Angiography (N/A) as a surgical intervention .  The patient's history has been reviewed, patient examined, no change in status, stable for surgery.  I have reviewed the patient's chart and labs.  Questions were answered to the patient's satisfaction.     Nicki Guadalajarahomas Zeus Marquis

## 2016-12-08 NOTE — Discharge Summary (Addendum)
Physician Discharge Summary  F[place]astle DUCRE MRN: [id number] DOB/AGE: Apr 27, 1966 50 y.o.  PCP: Melinda Crutch, MD   Admit date: 12/05/2016 Discharge date: 12/08/2016  Discharge Diagnoses:    Principal Problem:   Chest pain Active Problems:   Hypothyroidism   Hemorrhagic disorder due to intrinsic circulating anticoagulants (HCC)   Essential hypertension   Aneurysm of thoracic aorta (HCC)   Heartburn   Bicuspid aortic valve   Abnormal nuclear stress test    Follow-up recommendations Follow-up with PCP in 3-5 days , including all  additional recommended appointments as below Follow-up CBC, CMP in 3-5 days PCP to address patient's anxiety and depression patient will need to have CTA or MRA of thoracic aorta to further assess, to be arranged for by PCP FU with Dr Irish Lack following DC     Current Discharge Medication List    START taking these medications   Details  pantoprazole (PROTONIX) 40 MG tablet Take 1 tablet (40 mg total) by mouth daily at 6 (six) AM. Qty: 30 tablet, Refills: 2      CONTINUE these medications which have NOT CHANGED   Details  aspirin 81 MG tablet Take 81 mg by mouth daily.    levothyroxine (SYNTHROID, LEVOTHROID) 175 MCG tablet Take 175 mcg by mouth daily before breakfast.    losartan (COZAAR) 100 MG tablet Take 100 mg by mouth daily. Refills: 4    metoprolol succinate (TOPROL-XL) 50 MG 24 hr tablet Take 50 mg by mouth daily. Refills: 4         Discharge ConditionStable   Discharge Instructions Get Medicines reviewed and adjusted: Please take all your medications with you for your next visit with your Primary MD  Please request your Primary MD to go over all hospital tests and procedure/radiological results at the follow up, please ask your Primary MD to get all Hospital records sent to his/her office.  If you experience worsening of your admission symptoms, develop shortness of breath, life threatening emergency, suicidal or  homicidal thoughts you must seek medical attention immediately by calling 911 or calling your MD immediately if symptoms less severe.  You must read complete instructions/literature along with all the possible adverse reactions/side effects for all the Medicines you take and that have been prescribed to you. Take any new Medicines after you have completely understood and accpet all the possible adverse reactions/side effects.   Do not drive when taking Pain medications.   Do not take more than prescribed Pain, Sleep and Anxiety Medications  Special Instructions: If you have smoked or chewed Tobacco in the last 2 yrs please stop smoking, stop any regular Alcohol and or any Recreational drug use.  Wear Seat belts while driving.  Please note  You were cared for by a hospitalist during your hospital stay. Once you are discharged, your primary care physician will handle any further medical issues. Please note that NO REFILLS for any discharge medications will be authorized once you are discharged, as it is imperative that you return to your primary care physician (or establish a relationship with a primary care physician if you do not have one) for your aftercare needs so that they can reassess your need for medications and monitor your lab values.     Allergies  Allergen Reactions  . Latex Rash      Disposition:    Consults: *Cardiology     Significant Diagnostic Studies:  Dg Chest 2 View  Result Date: 12/05/2016 CLINICAL DATA:  Left side chest pain, shortness  of Breath EXAM: CHEST  2 VIEW COMPARISON:  04/10/2010 FINDINGS: Cardiomediastinal silhouette is stable. Mild elevation of the right hemidiaphragm. No infiltrate or pleural effusion. No pulmonary edema. Osteopenia and mild degenerative changes thoracic spine. IMPRESSION: No active cardiopulmonary disease. Electronically Signed   By: Lahoma Crocker M.D.   On: 12/05/2016 14:52   Nm Myocar Multi W/spect W/wall Motion /  Ef  Result Date: 12/06/2016 CLINICAL DATA:  Chest pain, hypertension, hyperlipidemia and history of thoracic aortic aneurysm. EXAM: MYOCARDIAL IMAGING WITH SPECT (REST AND PHARMACOLOGIC-STRESS) GATED LEFT VENTRICULAR WALL MOTION STUDY LEFT VENTRICULAR EJECTION FRACTION TECHNIQUE: Standard myocardial SPECT imaging was performed after resting intravenous injection of 10 mCi Tc-64mtetrofosmin. Subsequently, intravenous infusion of Lexiscan was performed under the supervision of the Cardiology staff. At peak effect of the drug, 30 mCi Tc-921metrofosmin was injected intravenously and standard myocardial SPECT imaging was performed. Quantitative gated imaging was also performed to evaluate left ventricular wall motion, and estimate left ventricular ejection fraction. COMPARISON:  None. FINDINGS: Perfusion: There is some relative diminished perfusion of the inferior wall on the stress acquisition compared to the rest acquisition. Although this may be reflective of ischemia, it does appear that bowel activity encroaches upon the inferior wall on the raw planar images and this may be artifactual from adjacent diaphragmatic and bowel attenuation. Wall Motion: Normal left ventricular wall motion. No left ventricular dilation. Left Ventricular Ejection Fraction: 64 % End diastolic volume 88 ml End systolic volume 32 ml IMPRESSION: 1. Cannot exclude inferior wall ischemia. It is felt that the inferior wall findings on the stress acquisition may be secondary to encroachment of the diaphragm and bowel activity on the inferior wall based on review of the raw planar images. 2. Normal left ventricular wall motion. 3. Left ventricular ejection fraction 64% 4. Non invasive risk stratification*: Intermediate *2012 Appropriate Use Criteria for Coronary Revascularization Focused Update: J Am Coll Cardiol. 205726;20(3):559-741http://[email or web address]?[email or web address] Electronically Signed   By: GlAletta Edouard.D.    On: 12/06/2016 17:11   2-D echo LV EF: 55% -   60%  ------------------------------------------------------------------- History:   PMH:   Chest pain.  Risk factors:  Hypertension. Dyslipidemia.  ------------------------------------------------------------------- Study Conclusions  - Left ventricle: The cavity size was normal. There was mild focal   basal hypertrophy of the septum. Systolic function was normal.   The estimated ejection fraction was in the range of 55% to 60%.   Wall motion was normal; there were no regional wall motion   abnormalities. Left ventricular diastolic function parameters   were normal. - Atrial septum: No defect or patent foramen ovale was identified.   Filed Weights   12/06/16 0532 12/07/16 0434 12/08/16 0500  Weight: 102.8 kg (226 lb 11.2 oz) 101.7 kg (224 lb 3.2 oz) 102 kg (224 lb 14.4 oz)     Microbiology: No results found for this or any previous visit (from the past 240 hour(s)).       Labs: Results for orders placed or performed during the hospital encounter of 12/05/16 (from the past 48 hour(s))  Basic metabolic panel     Status: Abnormal   Collection Time: 12/07/16  2:47 AM  Result Value Ref Range   Sodium 139 135 - 145 mmol/L   Potassium 3.5 3.5 - 5.1 mmol/L    Comment: DELTA CHECK NOTED   Chloride 108 101 - 111 mmol/L   CO2 23 22 - 32 mmol/L   Glucose, Bld 105 (H) 65 - 99 mg/dL   BUN 17 6 -  20 mg/dL   Creatinine, Ser 1.10 0.61 - 1.24 mg/dL   Calcium 9.0 8.9 - 10.3 mg/dL   GFR calc non Af Amer >60 >60 mL/min   GFR calc Af Amer >60 >60 mL/min    Comment: (NOTE) The eGFR has been calculated using the CKD EPI equation. This calculation has not been validated in all clinical situations. eGFR's persistently <60 mL/min signify possible Chronic Kidney Disease.    Anion gap 8 5 - 15  Lipid panel     Status: None   Collection Time: 12/07/16  2:47 AM  Result Value Ref Range   Cholesterol 140 0 - 200 mg/dL   Triglycerides 51  <150 mg/dL   HDL 43 >40 mg/dL   Total CHOL/HDL Ratio 3.3 RATIO   VLDL 10 0 - 40 mg/dL   LDL Cholesterol 87 0 - 99 mg/dL    Comment:        Total Cholesterol/HDL:CHD Risk Coronary Heart Disease Risk Table                     Men   Women  1/2 Average Risk   3.4   3.3  Average Risk       5.0   4.4  2 X Average Risk   9.6   7.1  3 X Average Risk  23.4   11.0        Use the calculated Patient Ratio above and the CHD Risk Table to determine the patient's CHD Risk.        ATP III CLASSIFICATION (LDL):  <100     mg/dL   Optimal  100-129  mg/dL   Near or Above                    Optimal  130-159  mg/dL   Borderline  160-189  mg/dL   High  >190     mg/dL   Very High   Protime-INR     Status: None   Collection Time: 12/08/16  3:49 AM  Result Value Ref Range   Prothrombin Time 14.7 11.4 - 15.2 seconds   INR 1.14      Lipid Panel     Component Value Date/Time   CHOL 140 12/07/2016 0247   TRIG 51 12/07/2016 0247   HDL 43 12/07/2016 0247   CHOLHDL 3.3 12/07/2016 0247   VLDL 10 12/07/2016 0247   LDLCALC 87 12/07/2016 0247     Lab Results  Component Value Date   HGBA1C  01/17/2010    5.3 (NOTE) The ADA recommends the following therapeutic goal for glycemic control related to Hgb A1c measurement: Goal of therapy: <6.5 Hgb A1c  Reference: American Diabetes Association: Clinical Practice Recommendations 2010, Diabetes Care, 2010, 33: (Suppl  1).   HGBA1C  10/10/2007    5.2 (NOTE)   The ADA recommends the following therapeutic goals for glycemic   control related to Hgb A1C measurement:   Goal of Therapy:   < 7.0% Hgb A1C   Action Suggested:  > 8.0% Hgb A1C   Ref:  Diabetes Care, 84, Suppl. 1, 1999        HPI    [name] Cottrellis a 50 y.o.malehypertension, hyperlipidemia, hypothyroidism, horacic aortic aneurysm, bicuspid aortic valve, R lower lobe PE (not on chronic anticoagulation), hypercoagulable state, dysphagia,? TIA, hyperthyroidism and remote hx of vasculitis who  admitted for chest pain, followed as an outpatient by. Irish Lack last seen about a year ago. The patient being followed  by Dr. Beau Fanny for thoracic aortic aneurysm. Last cardiac MRI 11/2015 showed increased aneusym to 4.5cm from 4.3cm in 2013. He became very short of breath and unable to complete the study after he presented for that film. These had already happened twice in 2 separate days. Cardiology consulted for chest pain evaluation  HOSPITAL COURSE:   Chest pain -Atypical symptoms-  Troponin negative x 3. EKG without acute changes. Echo with normal EF and mild focal LVH. Stress test intermediate risk, can not exclude ischemia. Seems his symptoms is more anxiety related.    status post cardiac catheterization .  Cath shows no CAD; no further WU. -Continue aspirin LDL 87      Hypothyroidism -Synthroid  hypokalemia - relaced-magnesium normal     Essential hypertension controlled -Losartan Beta blocker    Aneurysm of thoracic aorta /   Bicuspid aortic valve - followed as outpt by Dr Irish Lack 4.5cm on last study 11/2015. He is due to repeat test patient will need to have CTA or MRA of thoracic aorta to further assess. This can be performed as an outpatient    Discharge Exam:   Blood pressure (!) 145/91, pulse 67, temperature 97.8 F (36.6 C), temperature source Oral, resp. rate 18, height 6' 3" (1.905 m), weight 102 kg (224 lb 14.4 oz), SpO2 99 %.  GEN: Well nourished, well developed, in no acute distress.  HEENT: Grossly normal.  Neck: Supple Cardiac: RRR Respiratory:  CTA GI: Soft, nontender, nondistended MS: no deformity or atrophy. Skin: warm and dry, no rash. Neuro:  Strength and sensation are intact    Follow-up Information    Larae Grooms, MD. Go on 01/11/2017.   Specialties:  Cardiology, Radiology, Interventional Cardiology Why:  at 3:15pm for your follow up. Please call the office to arrange for your CT chest before this appt.  Contact  information: [address] [address] [phone]        Melinda Crutch, MD. Schedule an appointment as soon as possible for a visit in 2 day(s).   Specialty:  Family Medicine Why:  Hospital follow-up Contact information: [address] [place] [address] [phone]           Signed: Reyne Dumas 12/08/2016, 12:33 PM        Time spent >45 mins

## 2016-12-22 ENCOUNTER — Telehealth: Payer: Self-pay | Admitting: Interventional Cardiology

## 2016-12-22 NOTE — Telephone Encounter (Signed)
Called patient and gave him the date, time and location of cardiac MRI.

## 2016-12-25 ENCOUNTER — Inpatient Hospital Stay: Admission: RE | Admit: 2016-12-25 | Payer: Managed Care, Other (non HMO) | Source: Ambulatory Visit

## 2016-12-25 LAB — ECHOCARDIOGRAM COMPLETE
Ao-asc: 41 cm
E decel time: 236 msec
E/e' ratio: 7.02
FS: 29 % (ref 28–44)
HEIGHTINCHES: 75 in
IV/PV OW: 0.96
LA ID, A-P, ES: 32 mm
LA diam index: 1.39 cm/m2
LA vol A4C: 37 ml
LA vol index: 18.4 mL/m2
LA vol: 42.5 mL
LDCA: 2.84 cm2
LEFT ATRIUM END SYS DIAM: 32 mm
LV E/e' medial: 7.02
LV E/e'average: 7.02
LV PW d: 13.1 mm — AB (ref 0.6–1.1)
LVELAT: 12.5 cm/s
LVOT diameter: 19 mm
MV Dec: 236
MV pk A vel: 83.9 m/s
MVPG: 3 mmHg
MVPKEVEL: 87.8 m/s
RV LATERAL S' VELOCITY: 13.7 cm/s
RV TAPSE: 16.5 mm
TDI e' lateral: 12.5
TDI e' medial: 6.74
WEIGHTICAEL: 3627.2 [oz_av]

## 2016-12-27 ENCOUNTER — Emergency Department (HOSPITAL_COMMUNITY)
Admission: EM | Admit: 2016-12-27 | Discharge: 2016-12-28 | Disposition: A | Discharge status: Home / Self Care | Payer: Managed Care, Other (non HMO) | Attending: Emergency Medicine | Admitting: Emergency Medicine

## 2016-12-27 ENCOUNTER — Emergency Department (HOSPITAL_COMMUNITY): Payer: Managed Care, Other (non HMO)

## 2016-12-27 DIAGNOSIS — Z91040 Latex allergy status: Secondary | ICD-10-CM | POA: Insufficient documentation

## 2016-12-27 DIAGNOSIS — R079 Chest pain, unspecified: Secondary | ICD-10-CM | POA: Diagnosis not present

## 2016-12-27 DIAGNOSIS — Z7982 Long term (current) use of aspirin: Secondary | ICD-10-CM | POA: Insufficient documentation

## 2016-12-27 DIAGNOSIS — E039 Hypothyroidism, unspecified: Secondary | ICD-10-CM | POA: Diagnosis not present

## 2016-12-27 DIAGNOSIS — Z79899 Other long term (current) drug therapy: Secondary | ICD-10-CM | POA: Diagnosis not present

## 2016-12-27 DIAGNOSIS — I10 Essential (primary) hypertension: Secondary | ICD-10-CM | POA: Insufficient documentation

## 2016-12-27 LAB — CBC
HCT: 45.4 % (ref 39.0–52.0)
Hemoglobin: 16.3 g/dL (ref 13.0–17.0)
MCH: 33 pg (ref 26.0–34.0)
MCHC: 35.9 g/dL (ref 30.0–36.0)
MCV: 91.9 fL (ref 78.0–100.0)
PLATELETS: 182 10*3/uL (ref 150–400)
RBC: 4.94 MIL/uL (ref 4.22–5.81)
RDW: 13.2 % (ref 11.5–15.5)
WBC: 6.5 10*3/uL (ref 4.0–10.5)

## 2016-12-27 LAB — I-STAT TROPONIN, ED: Troponin i, poc: 0.01 ng/mL (ref 0.00–0.08)

## 2016-12-27 MED ORDER — SODIUM CHLORIDE 0.9 % IV SOLN: Freq: Once | INTRAVENOUS | Status: DC

## 2016-12-27 NOTE — ED Notes (Signed)
Delay in lab draw pt currently enroute to xray

## 2016-12-27 NOTE — ED Provider Notes (Signed)
[facility]-EMERGENCY DEPT Provider Note   CSN: [id number] Arrival date & time: 12/27/16  2207     History   Chief Complaint Chief Complaint  Patient presents with  . Chest Pain    HPI [name] is a 51 y.o. male.  51 y.o. Male with hx of hypertension, hyperlipidemia, hypothyroidism, thoracic aortic aneurysm, bicuspid aortic valve, R lower lobe PE (not on chronic anticoagulation), hypercoagulable state, dysphagia,? TIA, hyperthyroidism and remote hx of vasculitis presents to the ED for chest pain. Patient states the pain is sharp and present in his left upper chest. It has been constant since onset at 2030, but has mildly improved. He took 3 sublingual nitroglycerin prior to arrival and 4 baby aspirin without significant improvement in his pain. He has had chest tightness in the past, but states that this feels different. He denies any associated shortness of breath or diaphoresis. He did feel a bit dizzy and had one episode of emesis. No syncope or near-syncope. No leg swelling. No recent fevers. He states that he is scheduled for an MRI of his chest to evaluate his thoracic aneurysm on Thursday.  Hx of cardiac cath during hospitalization from 12/05/16-12/08/16 with no evidence of CAD. LVEF normal during this admission as well. Cardiologist - Dr. Eldridge Dace   The history is provided by the patient and the spouse. No language interpreter was used.  Chest Pain      Past Medical History:  Diagnosis Date  . ABDOMINAL PAIN-EPIGASTRIC   . COAGULOPATHY, COUMADIN-INDUCED   . DYSPHAGIA OROPHARYNGEAL PHASE   . Heartburn   . HYPERLIPIDEMIA   . HYPERTENSION   . HYPERTENSION NEC   . HYPOTHYROIDISM   . PE   . Primary hypercoagulable state (HCC)   . Thoracic aneurysm without mention of rupture   . Thoracic aortic aneurysm (HCC)   . VASCULITIS     Patient Active Problem List   Diagnosis Date Noted  . Abnormal nuclear stress test   . Chest pain 12/05/2016  . Bicuspid aortic valve  10/17/2013  . PE 06/16/2010  . Hemorrhagic disorder due to intrinsic circulating anticoagulants (HCC) 04/07/2010  . Primary hypercoagulable state (HCC) 04/07/2010  . CHEST PAIN UNSPECIFIED 04/07/2010  . VASCULITIS 01/30/2010  . Nonspecific (abnormal) findings on radiological and other examination of body structure 01/15/2010  . CT, CHEST, ABNORMAL 01/15/2010  . Aneurysm of thoracic aorta (HCC) 01/14/2010  . Heartburn 01/14/2010  . DYSPHAGIA 01/14/2010  . ABDOMINAL PAIN-EPIGASTRIC 01/14/2010  . Hypothyroidism 01/06/2010  . HYPERLIPIDEMIA 01/06/2010  . Essential hypertension 01/06/2010  . LOSS OF WEIGHT 01/06/2010  . DYSPHAGIA OROPHARYNGEAL PHASE 01/06/2010  . DYSPHAGIA UNSPECIFIED 12/31/2009  . HYPERTENSION NEC 12/31/2009    Past Surgical History:  Procedure Laterality Date  . CARDIAC CATHETERIZATION N/A 12/08/2016   Procedure: Left Heart Cath and Coronary Angiography;  Surgeon: Lennette Bihari, MD;  Location: [facility] INVASIVE CV LAB;  Service: Cardiovascular;  Laterality: N/A;  . ELBOW SURGERY Left 2010       Home Medications    Prior to Admission medications   Medication Sig Start Date End Date Taking? Authorizing Provider  aspirin 81 MG tablet Take 81 mg by mouth daily.   Yes Historical Provider, MD  levothyroxine (SYNTHROID, LEVOTHROID) 175 MCG tablet Take 175 mcg by mouth daily before breakfast.   Yes Historical Provider, MD  Losartan Potassium-HCTZ (HYZAAR PO) Take 1 tablet by mouth daily. 100-? (pt unsure of HCTZ dose, will call pharmacy tomorrow if needed)   Yes Historical Provider, MD  metoprolol succinate (TOPROL-XL) 50 MG 24 hr tablet Take 50 mg by mouth daily. 09/11/14  Yes Historical Provider, MD  pantoprazole (PROTONIX) 40 MG tablet Take 1 tablet (40 mg total) by mouth daily at 6 (six) AM. 12/09/16  Yes Richarda Overlie, MD  sertraline (ZOLOFT) 50 MG tablet Take 50 mg by mouth daily.   Yes Historical Provider, MD  promethazine (PHENERGAN) 25 MG tablet Take 1 tablet (25 mg  total) by mouth every 6 (six) hours as needed for nausea or vomiting. 12/28/16   Antony Madura, PA-C    Family History Family History  Problem Relation Age of Onset  . Heart disease Mother   . Hypertension Father     Social History Social History  Substance Use Topics  . Smoking status: Never Smoker  . Smokeless tobacco: Never Used  . Alcohol use No     Allergies   Latex   Review of Systems Review of Systems  Cardiovascular: Positive for chest pain.  Ten systems reviewed and are negative for acute change, except as noted in the HPI.    Physical Exam Updated Vital Signs BP 153/94   Pulse 69   Resp (!) 9   SpO2 99%   Physical Exam  Constitutional: He is oriented to person, place, and time. He appears well-developed and well-nourished. No distress.  Nontoxic and in NAD  HENT:  Head: Normocephalic and atraumatic.  Eyes: Conjunctivae and EOM are normal. No scleral icterus.  Neck: Normal range of motion.  Cardiovascular: Normal rate, regular rhythm and intact distal pulses.   Distal radial pulse 2+ b/l  Pulmonary/Chest: Effort normal. No respiratory distress. He has no wheezes. He has no rales.  Respirations even and unlabored  Abdominal: Soft. He exhibits no distension and no mass. There is no tenderness. There is no guarding.  Soft, nontender abdomen. No masses or peritoneal signs.  Musculoskeletal: Normal range of motion.  Neurological: He is alert and oriented to person, place, and time. He exhibits normal muscle tone. Coordination normal.  GCS 15. Patient moving all extremities.  Skin: Skin is warm and dry. No rash noted. He is not diaphoretic. No erythema. No pallor.  Psychiatric: He has a normal mood and affect. His behavior is normal.  Nursing note and vitals reviewed.    ED Treatments / Results  Labs (all labs ordered are listed, but only abnormal results are displayed) Labs Reviewed  BASIC METABOLIC PANEL - Abnormal; Notable for the following:        Result Value   Potassium 3.1 (*)    All other components within normal limits  CBC  I-STAT TROPOININ, ED  I-STAT TROPOININ, ED    EKG  EKG Interpretation  Date/Time:  Sunday December 27 2016 22:10:24 EST Ventricular Rate:  71 PR Interval:    QRS Duration: 101 QT Interval:  383 QTC Calculation: 417 R Axis:   21 Text Interpretation:  Sinus rhythm Baseline wander in lead(s) II No significant change since last tracing Confirmed by KNAPP  MD-J, JON ([id number]) on 12/27/2016 10:18:11 PM       Radiology Dg Chest 2 View  Result Date: 12/27/2016 CLINICAL DATA:  Left-sided chest pain tonight. History of hypertension and aortic aneurysm. EXAM: CHEST  2 VIEW COMPARISON:  12/05/2016 FINDINGS: The heart size and mediastinal contours are within normal limits. Both lungs are clear. The visualized skeletal structures are unremarkable. IMPRESSION: No active cardiopulmonary disease. Electronically Signed   By: Burman Nieves M.D.   On: 12/27/2016 22:53   Ct  Angio Chest Aorta W And/or Wo Contrast  Result Date: 12/28/2016 CLINICAL DATA:  51 y/o M; left-sided chest pain radiating into the back with left arm pain and numbness. History of thoracic aortic aneurysm. EXAM: CT ANGIOGRAPHY CHEST WITH CONTRAST TECHNIQUE: Multidetector CT imaging of the chest was performed using the standard protocol during bolus administration of intravenous contrast. Multiplanar CT image reconstructions and MIPs were obtained to evaluate the vascular anatomy. CONTRAST:  100 cc Omnipaque [id number]. COMPARISON:  MRI angiogram dated 12/03/2015.  02/26/2010 CT chest. FINDINGS: Cardiovascular: Ascending aortic aneurysm measuring 45 x 45 mm (series 4, image 30). Normal heart size. No pericardial effusion. Satisfactory opacification of pulmonary arteries to segmental level. No pulmonary embolus. Bicuspid aortic valve better characterized on prior MRI. Mediastinum/Nodes: No enlarged mediastinal, hilar, or axillary lymph nodes. Thyroid gland, trachea,  and esophagus demonstrate no significant findings. Lungs/Pleura: 5 mm subpleural right lower lobe nodule (series 6, image 95) is stable consistent with benign etiology, probably subpleural lymph node. No consolidation, effusion, or pneumothorax. Upper Abdomen: There multiple fluid attenuating foci in the liver, some increased in size in comparison with prior CT of the chest, likely representing cysts. Left kidney interpolar cyst measuring 20 mm. No acute abnormality. Musculoskeletal: No chest wall abnormality. No acute or significant osseous findings. Review of the MIP images confirms the above findings. IMPRESSION: 1. Stable ascending aortic aneurysm measuring 45 mm. No evidence of aortic dissection. 2. Satisfactory opacification of pulmonary arteries. No pulmonary embolus. 3. No acute pulmonary process. Electronically Signed   By: Mitzi HansenLance  Furusawa-Stratton M.D.   On: 12/28/2016 01:29    Procedures Procedures (including critical care time)  Medications Ordered in ED Medications  0.9 %  sodium chloride infusion (not administered)  iopamidol (ISOVUE-370) 76 % injection (100 mLs  Contrast Given 12/28/16 0046)     Initial Impression / Assessment and Plan / ED Course  I have reviewed the triage vital signs and the nursing notes.  Pertinent labs & imaging results that were available during my care of the patient were reviewed by me and considered in my medical decision making (see chart for details).  Clinical Course     51 year old male percent to the emergency department for evaluation of chest pain. He was recently admitted for chest pain rule out on 12/05/2016 and had a negative cardiac catheterization and reassuring echocardiogram during this admission. Chest pain is atypical of ACS. Patient has a negative troponin 2. EKG is nonischemic and stable compared to prior tracing. Patient does have a history of thoracic aortic aneurysm. CT angiogram of the chest was completed to rule out changes with  this. Aneurysm is stable on imaging today. No evidence of dissection. Symptoms atypical of PE. No tachycardia, tachypnea, dyspnea, or hypoxia today.   Given reassuring prior catheterization and workup today, I do not believe further emergent evaluation is indicated. I have advised the patient to follow-up with his cardiologist regarding his visit. Return precautions discussed and provided. Patient discharged in stable condition with no unaddressed concerns.   Final Clinical Impressions(s) / ED Diagnoses   Final diagnoses:  Nonspecific chest pain    New Prescriptions New Prescriptions   PROMETHAZINE (PHENERGAN) 25 MG TABLET    Take 1 tablet (25 mg total) by mouth every 6 (six) hours as needed for nausea or vomiting.     Antony MaduraKelly Shanoah Asbill, PA-C 12/28/16 [id number]    Linwood DibblesJon Knapp, MD 12/29/16 [phone]

## 2016-12-27 NOTE — ED Triage Notes (Signed)
[facility].  Onset 2-3 hours PTA left chest pain and left arm pain/numbness, dizziness, and nausea.     Denies dizziness and nausea at this time.  Last week onset N/V/D, vomited x 1 today, diarrhea x 7-loose, yellow.  2 weeks ago PCP added HCTZ and Zoloft.

## 2016-12-28 ENCOUNTER — Telehealth: Payer: Self-pay | Admitting: Interventional Cardiology

## 2016-12-28 ENCOUNTER — Emergency Department (HOSPITAL_COMMUNITY): Payer: Managed Care, Other (non HMO)

## 2016-12-28 LAB — BASIC METABOLIC PANEL
ANION GAP: 15 (ref 5–15)
BUN: 10 mg/dL (ref 6–20)
CALCIUM: 9.2 mg/dL (ref 8.9–10.3)
CO2: 23 mmol/L (ref 22–32)
CREATININE: 0.92 mg/dL (ref 0.61–1.24)
Chloride: 103 mmol/L (ref 101–111)
Glucose, Bld: 89 mg/dL (ref 65–99)
Potassium: 3.1 mmol/L — ABNORMAL LOW (ref 3.5–5.1)
SODIUM: 141 mmol/L (ref 135–145)

## 2016-12-28 LAB — I-STAT TROPONIN, ED: TROPONIN I, POC: 0.01 ng/mL (ref 0.00–0.08)

## 2016-12-28 MED ORDER — IOPAMIDOL (ISOVUE-370) INJECTION 76%
INTRAVENOUS | Status: AC
  Administered 2016-12-28: 100 mL
  Filled 2016-12-28: qty 100

## 2016-12-28 MED ORDER — PROMETHAZINE HCL 25 MG PO TABS: 25.0000 mg | ORAL_TABLET | Freq: Four times a day (QID) | ORAL | 0 refills | Status: DC | PRN

## 2016-12-28 NOTE — Telephone Encounter (Signed)
Please advise 

## 2016-12-28 NOTE — Discharge Instructions (Signed)
Follow up with your cardiologist. Continue taking your prescribed blood pressure medication. Take Phenergan as needed for nausea/vomiting. Return for new or concerning symptoms.

## 2016-12-28 NOTE — Telephone Encounter (Signed)
New message      Pt was seen in ER last night.  He had a CT early this am.  He is schedule to see Dr [name] in a couple of weeks.  He is scheduled to have a MRI on jan 11th.  Since he was seen in ER and had CT can he cancel his MRI appt or will he still need to keep it?  Please call

## 2016-12-28 NOTE — ED Notes (Signed)
Pt to CT via stretcher

## 2016-12-28 NOTE — Telephone Encounter (Addendum)
The pt is advised per Dr Eden EmmsNishan that he does not need to have MR angiogram that is scheduled for 12/31/16. He verbalized understanding.  Will forward to Dr [name] as Lorain ChildesFYI.

## 2016-12-31 ENCOUNTER — Other Ambulatory Visit (HOSPITAL_COMMUNITY): Payer: Managed Care, Other (non HMO)

## 2017-01-10 NOTE — Progress Notes (Signed)
Patient ID: [name], male   DOB: 01/10/1966, 51 y.o.   MRN: [id number]     Cardiology Office Note   Date:  01/11/2017   ID:  [name], DOB 09/21/1966, MRN [id number]  PCP:  Duane LopeAlan Ross, MD    Chief Complaint  Patient presents with  . BAV  f/u bicuspid Ao valve, thoracic aneurysm   Wt Readings from Last 3 Encounters:  01/11/17 218 lb 12.8 oz (99.2 kg)  12/08/16 224 lb 14.4 oz (102 kg)  01/09/16 232 lb (105.2 kg)       History of Present Illness: [name] is a 51 y.o. male   with a bicuspid aortic valve, and associated thoracic aortic aneurysm. BP is 120/80-90 at home.  He has been feeling well. He is physically active at his job. No chest discomfort or significant shortness of breath. Overall, he feels that he is doing well. He had a recent MRI. This showed that since 2013, his thoracic aortic aneurysm has increased from 4.3 cm to 4.5 cm.  His thyroid is been regulated. His blood pressure is been controlled. He had his lipids checked with his primary care doctor and they were controlled.  He had some chest pain and was admitted to the hospital in 12/17.  Cath was negative.    More CP led to CT scan in early Jan 2018 and thoracic aneurysm was the same size.  He had a GI illnesss and has lost some weight.    He has some persistent Left lower quadrant pain and epigastric burning.  He has had intermittent diarrhea.  No blood in stool or urine.  Appetite decreased.     Past Medical History:  Diagnosis Date  . ABDOMINAL PAIN-EPIGASTRIC   . COAGULOPATHY, COUMADIN-INDUCED   . DYSPHAGIA OROPHARYNGEAL PHASE   . Heartburn   . HYPERLIPIDEMIA   . HYPERTENSION   . HYPERTENSION NEC   . HYPOTHYROIDISM   . PE   . Primary hypercoagulable state (HCC)   . Thoracic aneurysm without mention of rupture   . Thoracic aortic aneurysm (HCC)   . VASCULITIS     Past Surgical History:  Procedure Laterality Date  . CARDIAC CATHETERIZATION N/A 12/08/2016   Procedure:  Left Heart Cath and Coronary Angiography;  Surgeon: Lennette Biharihomas A Kelly, MD;  [facility];  Service: Cardiovascular;  Laterality: N/A;  . ELBOW SURGERY Left 2010     Current Outpatient Prescriptions  Medication Sig Dispense Refill  . aspirin 81 MG tablet Take 81 mg by mouth daily.    Marland Kitchen. levothyroxine (SYNTHROID, LEVOTHROID) 175 MCG tablet Take 175 mcg by mouth daily before breakfast.    . losartan-hydrochlorothiazide (HYZAAR) 100-12.5 MG tablet Take 1 tablet by mouth daily.  3  . metoprolol succinate (TOPROL-XL) 50 MG 24 hr tablet Take 50 mg by mouth daily.  4  . pantoprazole (PROTONIX) 40 MG tablet Take 1 tablet (40 mg total) by mouth daily at 6 (six) AM. 30 tablet 2  . promethazine (PHENERGAN) 25 MG tablet Take 1 tablet (25 mg total) by mouth every 6 (six) hours as needed for nausea or vomiting. 12 tablet 0  . sertraline (ZOLOFT) 50 MG tablet Take 50 mg by mouth daily.     No current facility-administered medications for this visit.     Allergies:   Latex    Social History:  The patient  reports that he has never smoked. He has never used smokeless tobacco. He reports that he does not  drink alcohol or use drugs.   Family History:  The patient's family history includes Heart disease in his mother; Hypertension in his father.    ROS:  Please see the history of present illness.   Otherwise, review of systems are positive for chronic rash, high BPs.   All other systems are reviewed and negative.    PHYSICAL EXAM: VS:  BP 140/90   Pulse 86   Ht 6\' 3"  (1.905 m)   Wt 218 lb 12.8 oz (99.2 kg)   SpO2 98%   BMI 27.35 kg/m  , BMI Body mass index is 27.35 kg/m. GEN: Well nourished, well developed, in no acute distress  HEENT: normal  Neck: no JVD, carotid bruits, or masses Cardiac: RRR; soft early systolic murmurs, no rubs, or gallops,no edema  Respiratory:  clear to auscultation bilaterally, normal work of breathing GI: soft, nontender, nondistended, + BS, mild tenderness  to palpation of the left lower quadrant, no rebound tenderness or guarding MS: no deformity or atrophy  Skin: warm and dry, ; mild rash in antecubital area and abdomen Neuro:  Strength and sensation are intact Psych: euthymic mood, full affect   EKG:   The ekg ordered today demonstrates sinus bradycardia, no ST segment changes   Recent Labs: 12/06/2016: ALT 23; Magnesium 2.1 12/27/2016: BUN 10; Creatinine, Ser 0.92; Hemoglobin 16.3; Platelets 182; Potassium 3.1; Sodium 141   Lipid Panel    Component Value Date/Time   CHOL 140 12/07/2016 0247   TRIG 51 12/07/2016 0247   HDL 43 12/07/2016 0247   CHOLHDL 3.3 12/07/2016 0247   VLDL 10 12/07/2016 0247   LDLCALC 87 12/07/2016 0247     Other studies Reviewed: Additional studies/ records that were reviewed today with results demonstrating: MRI from December 2016 reviewed.   ASSESSMENT AND PLAN:  1. Bicuspid aortic valve:  No signs or symptoms of severe aortic stenosis.  Echo showed normal LVEF and no AS in 12/17. 2. Thoracic aortic aneurysm: small increase in size over a three-year period. Will repeat MRI study in December 2017.  I stressed the importance that he should not do any heavy lifting or straining by himself. At work, he has to do minimal lifting. If anything heavy comes up, he should ask for help.  Size is stable.  No CAD by recent cath.  3. HTN: Was on losartan 100 mg daily and felt ok.  HCTZ was added and he feels that he is worse.  THe patient wants to stop HCTZ.  Check BP at home and then we can have him f/u with the PharmD HTN clinic.  Stop combo pill and will prescribe irbesartan 300 mg daily.  BMet in one week.  Amlodipine could be added if irbesartan does not control BP adequately.   4. Abdominal pain: LLQ pain and GERD sx.  Sx have been going on for a month.  Refer to [facility].  5. Will put him on light duty for a week per his request.   Current medicines are reviewed at length with the patient today.  The patient  concerns regarding his medicines were addressed.  The following changes have been made:  No change  Labs/ tests ordered today include:  No orders of the defined types were placed in this encounter.   Recommend 150 minutes/week of aerobic exercise Low fat, low carb, high fiber diet recommended  Disposition:   FU with PharmD HTN in 1-2 weeks and with me in one year.   Signed, Lance Muss, MD  01/11/2017 3:37 PM    [facility] [address] [place], [place], [place]  [address] Phone: [phone]; Fax: [phone]

## 2017-01-11 ENCOUNTER — Encounter: Payer: Self-pay | Admitting: Interventional Cardiology

## 2017-01-11 ENCOUNTER — Ambulatory Visit (INDEPENDENT_AMBULATORY_CARE_PROVIDER_SITE_OTHER): Payer: Managed Care, Other (non HMO) | Admitting: Interventional Cardiology

## 2017-01-11 VITALS — BP 140/90 | HR 86 | Ht 75.0 in | Wt 218.8 lb

## 2017-01-11 DIAGNOSIS — I712 Thoracic aortic aneurysm, without rupture, unspecified: Secondary | ICD-10-CM

## 2017-01-11 DIAGNOSIS — K219 Gastro-esophageal reflux disease without esophagitis: Secondary | ICD-10-CM

## 2017-01-11 DIAGNOSIS — Q231 Congenital insufficiency of aortic valve: Secondary | ICD-10-CM

## 2017-01-11 DIAGNOSIS — R1032 Left lower quadrant pain: Secondary | ICD-10-CM

## 2017-01-11 DIAGNOSIS — I10 Essential (primary) hypertension: Secondary | ICD-10-CM

## 2017-01-11 MED ORDER — IRBESARTAN 300 MG PO TABS: 300.0000 mg | ORAL_TABLET | Freq: Every day | ORAL | 11 refills | Status: DC

## 2017-01-11 NOTE — Patient Instructions (Signed)
Medication Instructions:  Stop taking Losartan and start taking Irbesartan 300 mg daily. All other medications remain the same.  Labwork: BMET in 1 to 2 weeks-on same days as HTN clinic with Pharm D  Testing/Procedures: None  Follow-Up: Your physician wants you to follow-up in: 1 year. You will receive a reminder letter in the mail two months in advance. If you don't receive a letter, please call our office to schedule the follow-up appointment.   Any Other Special Instructions Will Be Listed Below (If Applicable). Dr [name] recommends that you come back in 1 to 2 weeks for a BMET and to be seen in our HTN Clinic with pharmacy.  Dr [name] has referred [facility] to evaluate your reflux and left lower abdominal pain.    If you need a refill on your cardiac medications before your next appointment, please call your pharmacy.

## 2017-01-22 ENCOUNTER — Ambulatory Visit (INDEPENDENT_AMBULATORY_CARE_PROVIDER_SITE_OTHER): Payer: Managed Care, Other (non HMO) | Admitting: Pharmacist

## 2017-01-22 ENCOUNTER — Other Ambulatory Visit: Payer: Managed Care, Other (non HMO) | Admitting: *Deleted

## 2017-01-22 VITALS — BP 152/94 | HR 69 | Wt 222.0 lb

## 2017-01-22 DIAGNOSIS — I10 Essential (primary) hypertension: Secondary | ICD-10-CM

## 2017-01-22 MED ORDER — AMLODIPINE BESYLATE 5 MG PO TABS: 5.0000 mg | ORAL_TABLET | Freq: Every day | ORAL | 2 refills | Status: DC

## 2017-01-22 NOTE — Patient Instructions (Signed)
Return for a follow up appointment in 3-4 weeks  Check your blood pressure at home daily (if able) and keep record of the readings.  Take your BP meds as follows: START amlodipine 5mg  daily  Continue all other medications as prescribed   Bring all of your meds, your BP cuff and your record of home blood pressures to your next appointment.  Exercise as you're able, try to walk approximately 30 minutes per day.  Keep salt intake to a minimum, especially watch canned and prepared boxed foods.  Eat more fresh fruits and vegetables and fewer canned items.  Avoid eating in fast food restaurants.    HOW TO TAKE YOUR BLOOD PRESSURE: . Rest 5 minutes before taking your blood pressure. .  Don't smoke or drink caffeinated beverages for at least 30 minutes before. . Take your blood pressure before (not after) you eat. . Sit comfortably with your back supported and both feet on the floor (don't cross your legs). . Elevate your arm to heart level on a table or a desk. . Use the proper sized cuff. It should fit smoothly and snugly around your bare upper arm. There should be enough room to slip a fingertip under the cuff. The bottom edge of the cuff should be 1 inch above the crease of the elbow. . Ideally, take 3 measurements at one sitting and record the average.

## 2017-01-22 NOTE — Progress Notes (Signed)
Patient ID: [name]                 DOB: Mar 14, 1966                      MRN: [id number]     HPI: [name] is a 51 y.o. male patient of Dr. Eldridge Dace with PMH below who presents today for hypertension evaluation.  PMH includes PMH, bicuspid aortic valve, and associated thoracic aortic aneurysm. At his most recent visit with Dr. Eldridge Dace his combination losartan/HCTZ pill was stopped as he felt HCTZ was making him feel poorly and he was started on irbesartan 300mg  daily.   He reports doing well since medication changes, except his pressure has been more elevated.    Current HTN meds:  Metoprolol succinate 50mg  daily Irbesartan 300mg  daily   Previously tried:  HCTZ - made him feel poorly  BP goal: <130/80  Family History: Mother with HTN. Father with HTN, heart pump and 2 stents.   Social History: Denies tobacco and alcohol.   Diet: Eats frozen or pre-prepared foods. He tries to be mindful of sodium intake. He has completely stopped pepsi. He denies coffee or tea.  Exercise: No exercise  Home BP readings:  Mostly 150s/90s  Wt Readings from Last 3 Encounters:  01/22/17 222 lb (100.7 kg)  01/11/17 218 lb 12.8 oz (99.2 kg)  12/08/16 224 lb 14.4 oz (102 kg)   BP Readings from Last 3 Encounters:  01/22/17 (!) 152/94  01/11/17 140/90  12/28/16 (!) 156/103   Pulse Readings from Last 3 Encounters:  01/22/17 69  01/11/17 86  12/28/16 69    Renal function: CrCl cannot be calculated (Patient's most recent lab result is older than the maximum 21 days allowed.).  Past Medical History:  Diagnosis Date  . ABDOMINAL PAIN-EPIGASTRIC   . COAGULOPATHY, COUMADIN-INDUCED   . DYSPHAGIA OROPHARYNGEAL PHASE   . Heartburn   . HYPERLIPIDEMIA   . HYPERTENSION   . HYPERTENSION NEC   . HYPOTHYROIDISM   . PE   . Primary hypercoagulable state (HCC)   . Thoracic aneurysm without mention of rupture   . Thoracic aortic aneurysm (HCC)   . VASCULITIS     Current  Outpatient Prescriptions on File Prior to Visit  Medication Sig Dispense Refill  . aspirin 81 MG tablet Take 81 mg by mouth daily.    . irbesartan (AVAPRO) 300 MG tablet Take 1 tablet (300 mg total) by mouth daily. 300 tablet 11  . levothyroxine (SYNTHROID, LEVOTHROID) 175 MCG tablet Take 175 mcg by mouth daily before breakfast.    . metoprolol succinate (TOPROL-XL) 50 MG 24 hr tablet Take 50 mg by mouth daily.  4  . pantoprazole (PROTONIX) 40 MG tablet Take 1 tablet (40 mg total) by mouth daily at 6 (six) AM. 30 tablet 2  . promethazine (PHENERGAN) 25 MG tablet Take 1 tablet (25 mg total) by mouth every 6 (six) hours as needed for nausea or vomiting. 12 tablet 0  . sertraline (ZOLOFT) 50 MG tablet Take 50 mg by mouth daily.     No current facility-administered medications on file prior to visit.     Allergies  Allergen Reactions  . Latex Rash    Blood pressure (!) 152/94, pulse 69, weight 222 lb (100.7 kg).   Assessment/Plan: Hypertension: BP above goal today and on home cuff. Will start amlodipine 5mg  daily and continue to monitor. Follow up in hypertension clinic in 4 weeks.  Thank you, Freddie ApleyKelley M. Cleatis PolkaAuten, PharmD  [facility]  01/22/2017 4:22 PM

## 2017-01-23 LAB — BASIC METABOLIC PANEL
BUN/Creatinine Ratio: 21 — ABNORMAL HIGH (ref 9–20)
BUN: 19 mg/dL (ref 6–24)
CHLORIDE: 104 mmol/L (ref 96–106)
CO2: 25 mmol/L (ref 18–29)
Calcium: 9.3 mg/dL (ref 8.7–10.2)
Creatinine, Ser: 0.91 mg/dL (ref 0.76–1.27)
GFR calc Af Amer: 115 mL/min/{1.73_m2} (ref >59)
GFR, EST NON AFRICAN AMERICAN: 99 mL/min/{1.73_m2} (ref >59)
Glucose: 89 mg/dL (ref 65–99)
POTASSIUM: 3.7 mmol/L (ref 3.5–5.2)
Sodium: 145 mmol/L — ABNORMAL HIGH (ref 134–144)

## 2017-02-18 NOTE — Addendum Note (Signed)
**Note De-Identified Medard Decuir Obfuscation** Addended by: Demetrios LollVIA, Marily Konczal M on: 02/18/2017 09:06 AM   Modules accepted: Orders

## 2017-02-19 ENCOUNTER — Encounter (INDEPENDENT_AMBULATORY_CARE_PROVIDER_SITE_OTHER): Payer: Self-pay

## 2017-02-19 ENCOUNTER — Ambulatory Visit (INDEPENDENT_AMBULATORY_CARE_PROVIDER_SITE_OTHER): Payer: Managed Care, Other (non HMO) | Admitting: Pharmacist

## 2017-02-19 VITALS — BP 132/92 | HR 60

## 2017-02-19 DIAGNOSIS — I10 Essential (primary) hypertension: Secondary | ICD-10-CM | POA: Diagnosis not present

## 2017-02-19 MED ORDER — AMLODIPINE BESYLATE 10 MG PO TABS: 10.0000 mg | ORAL_TABLET | Freq: Every day | ORAL | 2 refills | Status: DC

## 2017-02-19 NOTE — Patient Instructions (Signed)
Return for a a follow up appointment in 4-6 weeks.   Your blood pressure today is 132/92  Check your blood pressure at home daily (if able) and keep record of the readings.  Take your BP meds as follows: Continue Metoprolol succinate 50mg  daily, and Irbesartan 300mg  daily  Increase amlodipine to 10 mg daily (you can take 2 of the 5 mg tablets you have).  Bring all of your meds, your BP cuff and your record of home blood pressures to your next appointment.  Exercise as you're able, try to walk approximately 30 minutes per day.  Keep salt intake to a minimum, especially watch canned and prepared boxed foods.  Eat more fresh fruits and vegetables and fewer canned items.  Avoid eating in fast food restaurants.    HOW TO TAKE YOUR BLOOD PRESSURE: . Rest 5 minutes before taking your blood pressure. .  Don't smoke or drink caffeinated beverages for at least 30 minutes before. . Take your blood pressure before (not after) you eat. . Sit comfortably with your back supported and both feet on the floor (don't cross your legs). . Elevate your arm to heart level on a table or a desk. . Use the proper sized cuff. It should fit smoothly and snugly around your bare upper arm. There should be enough room to slip a fingertip under the cuff. The bottom edge of the cuff should be 1 inch above the crease of the elbow. . Ideally, take 3 measurements at one sitting and record the average.

## 2017-02-19 NOTE — Progress Notes (Signed)
Patient ID: [name]                 DOB: [birth year]                      MRN: [id number]     HPI: [name] is a 51 y.o. male patient of Dr. Eldridge Dace who presents today for hypertension evaluation.  PMH includes PMH, bicuspid aortic valve, and associated thoracic aortic aneurysm. At his most recent OV in HTN clinic he was started on amlodipine 5mg  daily.   Patient arrives today and states that he's been feeling good since his last appointment, with no complaints regarding the amlodipine.   Current HTN meds:  Amlodipine 5mg  daily (morning) Metoprolol succinate 50mg  daily (morning) Irbesartan 300mg  daily (morning)  Previously tried:  HCTZ - made him feel poorly  BP goal: <130/80  Family History: Mother with HTN. Father with HTN, heart pump and 2 stents.   Social History: Denies tobacco and alcohol.   Diet: Eats frozen or pre-prepared foods. He tries to be mindful of sodium intake. He has completely stopped pepsi. He denies coffee or tea.  Exercise: Has been walking more at work since the last appointment.  Home BP readings: York Spaniel he has been checking, and his last measurement was 140/86.  Wt Readings from Last 3 Encounters:  01/22/17 222 lb (100.7 kg)  01/11/17 218 lb 12.8 oz (99.2 kg)  12/08/16 224 lb 14.4 oz (102 kg)   BP Readings from Last 3 Encounters:  02/19/17 (!) 132/92  01/22/17 (!) 152/94  01/11/17 140/90   Pulse Readings from Last 3 Encounters:  02/19/17 60  01/22/17 69  01/11/17 86    Renal function: CrCl cannot be calculated (Patient's most recent lab result is older than the maximum 21 days allowed.).  Past Medical History:  Diagnosis Date  . ABDOMINAL PAIN-EPIGASTRIC   . COAGULOPATHY, COUMADIN-INDUCED   . DYSPHAGIA OROPHARYNGEAL PHASE   . Heartburn   . HYPERLIPIDEMIA   . HYPERTENSION   . HYPERTENSION NEC   . HYPOTHYROIDISM   . PE   . Primary hypercoagulable state (HCC)   . Thoracic aneurysm without mention of rupture   .  Thoracic aortic aneurysm (HCC)   . VASCULITIS     Current Outpatient Prescriptions on File Prior to Visit  Medication Sig Dispense Refill  . aspirin 81 MG tablet Take 81 mg by mouth daily.    . irbesartan (AVAPRO) 300 MG tablet Take 1 tablet (300 mg total) by mouth daily. 300 tablet 11  . levothyroxine (SYNTHROID, LEVOTHROID) 175 MCG tablet Take 175 mcg by mouth daily before breakfast.    . metoprolol succinate (TOPROL-XL) 50 MG 24 hr tablet Take 50 mg by mouth daily.  4  . pantoprazole (PROTONIX) 40 MG tablet Take 1 tablet (40 mg total) by mouth daily at 6 (six) AM. 30 tablet 2  . promethazine (PHENERGAN) 25 MG tablet Take 1 tablet (25 mg total) by mouth every 6 (six) hours as needed for nausea or vomiting. 12 tablet 0  . sertraline (ZOLOFT) 50 MG tablet Take 50 mg by mouth daily.     No current facility-administered medications on file prior to visit.     Allergies  Allergen Reactions  . Latex Rash    Blood pressure (!) 132/92, pulse 60, SpO2 97 %.   Assessment/Plan: Hypertension: Blood pressure is still above goal of <130/80 1. Increase amlodipine to 10 mg daily. Continue metoprolol succinate 50 my daily  and irbesartan 300 mg daily.  2. We discussed continuing to take blood pressure measurements at home and recording them to bring to follow up appointments.  Follow up appointment scheduled in 4 weeks.   Patient was seen with Caroline Moreatie Travis, PharmD Candidate 2018   Thank you, Freddie ApleyKelley M. Cleatis PolkaAuten, PharmD  [facility]  02/19/2017 3:50 PM

## 2017-03-19 ENCOUNTER — Ambulatory Visit (INDEPENDENT_AMBULATORY_CARE_PROVIDER_SITE_OTHER): Payer: 59 | Admitting: Pharmacist

## 2017-03-19 VITALS — BP 132/84 | HR 80

## 2017-03-19 DIAGNOSIS — I10 Essential (primary) hypertension: Secondary | ICD-10-CM

## 2017-03-19 NOTE — Progress Notes (Signed)
Patient ID: [name]                 DOB: [birth year]                      MRN: [id number]     HPI: [name] is a 51 y.o. male patient of Dr. Eldridge Dace who presents today for HTN follow up. PMH includes HTN, HLD, and bicuspid aortic valve with associated thoracic aortic aneurysm. At his most recent OV in HTN clinic 1 month ago, his amlodipine was increased 10 mg. He was asked to bring home BP readings to f/u visit today.  Pt reports feeling well overall. He denies dizziness, blurred vision, headache, or falls. He recalls checking his BP once since increasing his amlodipine dose and his reading was 130/39mmHg. He saw his PCP earlier this month and reports a BP reading of 120/70. He has been more stressed lately because his father recently passed.  Current HTN meds:  Amlodipine  daily (morning) Metoprolol succinate  daily (morning) Irbesartan  daily (morning)  Previously tried: HCTZ - made him feel poorly  BP goal: <130/62mmHg  Family History: Mother with HTN. Father with HTN, heart pump and 2 stents.   Social History: Denies tobacco and alcohol.   Diet: Eats frozen or pre-prepared foods. He tries to be mindful of sodium intake. He has completely stopped drinking Pepsi. He denies coffee or tea.  Exercise: Has been walking more at work since the last appointment.   Wt Readings from Last 3 Encounters:  01/22/17 222 lb (100.7 kg)  01/11/17 218 lb 12.8 oz (99.2 kg)  12/08/16 224 lb 14.4 oz (102 kg)   BP Readings from Last 3 Encounters:  02/19/17 (!) 132/92  01/22/17 (!) 152/94  01/11/17 140/90   Pulse Readings from Last 3 Encounters:  02/19/17 60  01/22/17 69  01/11/17 86    Renal function: CrCl cannot be calculated (Patient's most recent lab result is older than the maximum 21 days allowed.).  Past Medical History:  Diagnosis Date  . ABDOMINAL PAIN-EPIGASTRIC   . COAGULOPATHY, COUMADIN-INDUCED   . DYSPHAGIA OROPHARYNGEAL PHASE   .  Heartburn   . HYPERLIPIDEMIA   . HYPERTENSION   . HYPERTENSION NEC   . HYPOTHYROIDISM   . PE   . Primary hypercoagulable state (HCC)   . Thoracic aneurysm without mention of rupture   . Thoracic aortic aneurysm (HCC)   . VASCULITIS     Current Outpatient Prescriptions on File Prior to Visit  Medication Sig Dispense Refill  . amLODipine (NORVASC) 10 MG tablet Take 1 tablet (10 mg total) by mouth daily. 90 tablet 2  . aspirin 81 MG tablet Take 81 mg by mouth daily.    . irbesartan (AVAPRO) 300 MG tablet Take 1 tablet (300 mg total) by mouth daily. 300 tablet 11  . levothyroxine (SYNTHROID, LEVOTHROID) 175 MCG tablet Take 175 mcg by mouth daily before breakfast.    . metoprolol succinate (TOPROL-XL) 50 MG 24 hr tablet Take 50 mg by mouth daily.  4  . pantoprazole (PROTONIX) 40 MG tablet Take 1 tablet (40 mg total) by mouth daily at 6 (six) AM. 30 tablet 2  . promethazine (PHENERGAN) 25 MG tablet Take 1 tablet (25 mg total) by mouth every 6 (six) hours as needed for nausea or vomiting. 12 tablet 0  . sertraline (ZOLOFT) 50 MG tablet Take 50 mg by mouth daily.     No current facility-administered medications on  file prior to visit.     Allergies  Allergen Reactions  . Latex Rash    There were no vitals taken for this visit.   Assessment/Plan:  1. Hypertension: BP very close to goal <130/68mmHg in clinic and home readings are at goal. Will have pt continue taking amlodipine, metoprolol, and irbesartan. Asked pt to monitor his BP readings at home and provided him with a BP log to do so. Will follow up once more in clinic in 1 month to ensure that BP readings remain at goal.  Patient was seen with Catie Feliz Beam, PharmD Candidate 2018   Megan E. Supple, PharmD, CPP, BCACP [facility] [address], [place], [place] [address] Phone: [phone]; Fax: [phone] 03/19/2017 8:58 AM

## 2017-03-19 NOTE — Patient Instructions (Addendum)
Continue taking your amlodipine, metoprolol, and irbesartan once a day.  Check your blood pressure at home a few days a week using your bicep cuff and record your readings.  Follow up in 4 weeks for a final blood pressure check  Call clinic with any concerns #[phone]

## 2017-04-13 ENCOUNTER — Ambulatory Visit: Payer: 59

## 2017-04-20 ENCOUNTER — Encounter (INDEPENDENT_AMBULATORY_CARE_PROVIDER_SITE_OTHER): Payer: Self-pay

## 2017-04-20 ENCOUNTER — Ambulatory Visit (INDEPENDENT_AMBULATORY_CARE_PROVIDER_SITE_OTHER): Payer: 59 | Admitting: Pharmacist

## 2017-04-20 VITALS — BP 128/76 | HR 63

## 2017-04-20 DIAGNOSIS — I10 Essential (primary) hypertension: Secondary | ICD-10-CM | POA: Diagnosis not present

## 2017-04-20 MED ORDER — METOPROLOL SUCCINATE ER 50 MG PO TB24: 50.0000 mg | ORAL_TABLET | Freq: Every day | ORAL | 3 refills | Status: DC

## 2017-04-20 NOTE — Progress Notes (Signed)
Patient ID: COLAN LAYMON                 DOB: [birth year]                      MRN: [id number]     HPI: [name] is a 51 y.o. male patient of Dr. Eldridge Dace who presents today for HTN follow up. PMH includes HTN, HLD, and bicuspid aortic valve with associated thoracic aortic aneurysm. At his most recent OV in HTN clinic his pressure was borderline at goal and he was continued on his current regimen. He was asked to bring home BP readings to f/u visit today.  Pt reports feeling well overall. He denies dizziness, blurred vision, headache, or falls. Pt reports that pressure have been running in 130s/80s (he forgot his log today).   Current HTN meds:  Amlodipine  daily (morning) Metoprolol succinate  daily (morning) Irbesartan  daily (morning)  Previously tried: HCTZ - made him feel poorly  BP goal: <130/73mmHg  Family History: Mother with HTN. Father with HTN, heart pump and 2 stents.   Social History: Denies tobacco and alcohol.   Diet: Eats frozen or pre-prepared foods. He tries to be mindful of sodium intake. He has completely stopped drinking Pepsi. He denies coffee or tea.  Exercise: Has been walking more at work since the last appointment.   Wt Readings from Last 3 Encounters:  01/22/17 222 lb (100.7 kg)  01/11/17 218 lb 12.8 oz (99.2 kg)  12/08/16 224 lb 14.4 oz (102 kg)   BP Readings from Last 3 Encounters:  04/20/17 128/76  03/19/17 132/84  02/19/17 (!) 132/92   Pulse Readings from Last 3 Encounters:  04/20/17 63  03/19/17 80  02/19/17 60    Renal function: CrCl cannot be calculated (Patient's most recent lab result is older than the maximum 21 days allowed.).  Past Medical History:  Diagnosis Date  . ABDOMINAL PAIN-EPIGASTRIC   . COAGULOPATHY, COUMADIN-INDUCED   . DYSPHAGIA OROPHARYNGEAL PHASE   . Heartburn   . HYPERLIPIDEMIA   . HYPERTENSION   . HYPERTENSION NEC   . HYPOTHYROIDISM   . PE   . Primary hypercoagulable state  (HCC)   . Thoracic aneurysm without mention of rupture   . Thoracic aortic aneurysm (HCC)   . VASCULITIS     Current Outpatient Prescriptions on File Prior to Visit  Medication Sig Dispense Refill  . amLODipine (NORVASC) 10 MG tablet Take 1 tablet (10 mg total) by mouth daily. 90 tablet 2  . aspirin 81 MG tablet Take 81 mg by mouth daily.    . irbesartan (AVAPRO) 300 MG tablet Take 1 tablet (300 mg total) by mouth daily. 300 tablet 11  . levothyroxine (SYNTHROID, LEVOTHROID) 175 MCG tablet Take 175 mcg by mouth daily before breakfast.    . pantoprazole (PROTONIX) 40 MG tablet Take 1 tablet (40 mg total) by mouth daily at 6 (six) AM. 30 tablet 2  . promethazine (PHENERGAN) 25 MG tablet Take 1 tablet (25 mg total) by mouth every 6 (six) hours as needed for nausea or vomiting. 12 tablet 0  . sertraline (ZOLOFT) 50 MG tablet Take 50 mg by mouth daily.     No current facility-administered medications on file prior to visit.     Allergies  Allergen Reactions  . Latex Rash    Blood pressure 128/76, pulse 63.   Assessment/Plan:  1. Hypertension: BP remains at goal. Continue current regimen and follow up  with Dr. Eldridge Dace as scheduled. Follow up with HTN clinic as needed.    Thank you, Freddie Apley. Cleatis Polka, PharmD [facility]  [address], [place], [place] [address]  Phone: [phone]; Fax: [phone] 04/20/2017 4:04 PM

## 2017-04-20 NOTE — Patient Instructions (Signed)
Return for a follow up appointment as schedule with Dr. Eldridge Dace   Check your blood pressure at home daily (if able) and keep record of the readings.  Take your BP meds as follows: Continue all other medications as prescribed  Bring all of your meds, your BP cuff and your record of home blood pressures to your next appointment.  Exercise as you're able, try to walk approximately 30 minutes per day.  Keep salt intake to a minimum, especially watch canned and prepared boxed foods.  Eat more fresh fruits and vegetables and fewer canned items.  Avoid eating in fast food restaurants.    HOW TO TAKE YOUR BLOOD PRESSURE: . Rest 5 minutes before taking your blood pressure. .  Don't smoke or drink caffeinated beverages for at least 30 minutes before. . Take your blood pressure before (not after) you eat. . Sit comfortably with your back supported and both feet on the floor (don't cross your legs). . Elevate your arm to heart level on a table or a desk. . Use the proper sized cuff. It should fit smoothly and snugly around your bare upper arm. There should be enough room to slip a fingertip under the cuff. The bottom edge of the cuff should be 1 inch above the crease of the elbow. . Ideally, take 3 measurements at one sitting and record the average.

## 2017-04-21 ENCOUNTER — Encounter: Payer: Self-pay | Admitting: Pharmacist

## 2017-11-25 ENCOUNTER — Other Ambulatory Visit: Payer: Self-pay | Admitting: Interventional Cardiology

## 2018-01-19 NOTE — Progress Notes (Signed)
Cardiology Office Note   Date:  01/20/2018   ID:  [name], DOB 05-22-1966, MRN [id number]  PCP:  Daisy Floro, MD    No chief complaint on file. bicuspid aortic valve; thoracic aortic aneurysm   Wt Readings from Last 3 Encounters:  01/20/18 243 lb (110.2 kg)  01/22/17 222 lb (100.7 kg)  01/11/17 218 lb 12.8 oz (99.2 kg)       History of Present Illness: [name] is a 52 y.o. male  with a bicuspid aortic valve, and associated thoracic aortic aneurysm.   MRI in 2016 showed that since 2013, his thoracic aortic aneurysm has increased from 4.3 cm to 4.5 cm.  He had some chest pain and was admitted to the hospital in 12/17.  Cath was negative.    More CP led to CT scan in early Jan 2018 and thoracic aneurysm was the same size.   His father passed away from CHF in 2023/03/19; he had an LVAD.   CT scan in 2018 showed saneurysm size of 4.5 cm.  BP has been 120s/80s. BP control with medication.   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  He works for [employer].   He has gained weight in the last year.    Denies : Chest pain. Dizziness. Leg edema. Nitroglycerin use. Orthopnea. Palpitations. Paroxysmal nocturnal dyspnea. Shortness of breath. Syncope.     Past Medical History:  Diagnosis Date  . ABDOMINAL PAIN-EPIGASTRIC   . COAGULOPATHY, COUMADIN-INDUCED   . DYSPHAGIA OROPHARYNGEAL PHASE   . Heartburn   . HYPERLIPIDEMIA   . HYPERTENSION   . HYPERTENSION NEC   . HYPOTHYROIDISM   . PE   . Primary hypercoagulable state (HCC)   . Thoracic aneurysm without mention of rupture   . Thoracic aortic aneurysm (HCC)   . VASCULITIS     Past Surgical History:  Procedure Laterality Date  . CARDIAC CATHETERIZATION N/A 12/08/2016   Procedure: Left Heart Cath and Coronary Angiography;  Surgeon: Lennette Bihari, MD;  Location: [facility] INVASIVE CV LAB;  Service: Cardiovascular;  Laterality: N/A;  . ELBOW SURGERY Left 2010     Current Outpatient Medications   Medication Sig Dispense Refill  . amLODipine (NORVASC) 10 MG tablet Take 1 tablet (10 mg total) by mouth daily. Please make yearly appt with Dr. Eldridge Dace for January before anymore refills. 1st attempt 90 tablet 0  . aspirin 81 MG tablet Take 81 mg by mouth daily.    . irbesartan (AVAPRO) 300 MG tablet Take 1 tablet (300 mg total) by mouth daily. 300 tablet 11  . levothyroxine (SYNTHROID, LEVOTHROID) 175 MCG tablet Take 175 mcg by mouth daily before breakfast.    . metoprolol succinate (TOPROL-XL) 50 MG 24 hr tablet Take 1 tablet (50 mg total) by mouth daily. 90 tablet 3  . sertraline (ZOLOFT) 50 MG tablet Take 50 mg by mouth daily.     No current facility-administered medications for this visit.     Allergies:   Latex    Social History:  The patient  reports that  has never smoked. he has never used smokeless tobacco. He reports that he does not drink alcohol or use drugs.   Family History:  The patient's family history includes Heart disease in his mother; Hypertension in his father.    ROS:  Please see the history of present illness.   Otherwise, review of systems are positive for weight gain; he has ben eating more at work.  All other systems are reviewed and negative.    PHYSICAL EXAM: VS:  BP 116/74   Pulse 61   Ht 6\' 3"  (1.905 m)   Wt 243 lb (110.2 kg)   SpO2 95%   BMI 30.37 kg/m  , BMI Body mass index is 30.37 kg/m. GEN: Well nourished, well developed, in no acute distress  HEENT: normal  Neck: no JVD, carotid bruits, or masses Cardiac: RRR; no murmurs, rubs, or gallops,no edema  Respiratory:  clear to auscultation bilaterally, normal work of breathing GI: soft, nontender, nondistended, + BS MS: no deformity or atrophy  Skin: warm and dry, no rash Neuro:  Strength and sensation are intact Psych: euthymic mood, full affect   EKG:   The ekg ordered today demonstrates Normal ECG   Recent Labs: 01/22/2017: BUN 19; Creatinine, Ser 0.91; Potassium 3.7; Sodium 145    Lipid Panel    Component Value Date/Time   CHOL 140 12/07/2016 0247   TRIG 51 12/07/2016 0247   HDL 43 12/07/2016 0247   CHOLHDL 3.3 12/07/2016 0247   VLDL 10 12/07/2016 0247   LDLCALC 87 12/07/2016 0247     Other studies Reviewed: Additional studies/ records that were reviewed today with results demonstrating: 2018 CTA reviewed with aortic diameter measured at 45 mm.   ASSESSMENT AND PLAN:  1. Bicuspid aortic valve: 2017 echo showed normal LV function and no AS.  2. THoracic aortic aneurysm: He prefers a CT of his chest as the MRI causes anxiety. 3. HTN: Well controlled.  Continue current meds. 4. Obesity: weight gain in the past year.  He needs to eat higher quality foods with less added sugar and exercise more.    Current medicines are reviewed at length with the patient today.  The patient concerns regarding his medicines were addressed.  The following changes have been made:  No change  Labs/ tests ordered today include:  No orders of the defined types were placed in this encounter.   Recommend 150 minutes/week of aerobic exercise Low fat, low carb, high fiber diet recommended  Disposition:   FU in 1 year   Signed, Lance MussJayadeep Romar Woodrick, MD  01/20/2018 3:52 PM    [facility] [address], [place], [place]  [address] Phone: [phone]; Fax: [phone]

## 2018-01-20 ENCOUNTER — Ambulatory Visit (INDEPENDENT_AMBULATORY_CARE_PROVIDER_SITE_OTHER): Payer: 59 | Admitting: Interventional Cardiology

## 2018-01-20 ENCOUNTER — Encounter (INDEPENDENT_AMBULATORY_CARE_PROVIDER_SITE_OTHER): Payer: Self-pay

## 2018-01-20 ENCOUNTER — Encounter: Payer: Self-pay | Admitting: Interventional Cardiology

## 2018-01-20 VITALS — BP 116/74 | HR 61 | Ht 75.0 in | Wt 243.0 lb

## 2018-01-20 DIAGNOSIS — Q231 Congenital insufficiency of aortic valve: Secondary | ICD-10-CM | POA: Diagnosis not present

## 2018-01-20 DIAGNOSIS — E669 Obesity, unspecified: Secondary | ICD-10-CM

## 2018-01-20 DIAGNOSIS — I712 Thoracic aortic aneurysm, without rupture, unspecified: Secondary | ICD-10-CM

## 2018-01-20 DIAGNOSIS — I10 Essential (primary) hypertension: Secondary | ICD-10-CM | POA: Diagnosis not present

## 2018-01-20 NOTE — Patient Instructions (Signed)
Medication Instructions:  Your physician recommends that you continue on your current medications as directed. Please refer to the Current Medication list given to you today.   Labwork: TODAY: BMET  Testing/Procedures: CT Angiography (CTA) of the chest, is a special type of CT scan that uses a computer to produce multi-dimensional views of major blood vessels throughout the body. In CT angiography, a contrast material is injected through an IV to help visualize the blood vessels  Follow-Up: Your physician wants you to follow-up in: 1 year with Dr. [name]. You will receive a reminder letter in the mail two months in advance. If you don't receive a letter, please call our office to schedule the follow-up appointment.   Any Other Special Instructions Will Be Listed Below (If Applicable).     If you need a refill on your cardiac medications before your next appointment, please call your pharmacy.

## 2018-01-21 LAB — BASIC METABOLIC PANEL
BUN/Creatinine Ratio: 13 (ref 9–20)
BUN: 15 mg/dL (ref 6–24)
CALCIUM: 9.2 mg/dL (ref 8.7–10.2)
CHLORIDE: 102 mmol/L (ref 96–106)
CO2: 25 mmol/L (ref 20–29)
Creatinine, Ser: 1.2 mg/dL (ref 0.76–1.27)
GFR calc Af Amer: 82 mL/min/{1.73_m2} (ref >59)
GFR calc non Af Amer: 71 mL/min/{1.73_m2} (ref >59)
Glucose: 88 mg/dL (ref 65–99)
Potassium: 3.6 mmol/L (ref 3.5–5.2)
Sodium: 143 mmol/L (ref 134–144)

## 2018-02-02 ENCOUNTER — Ambulatory Visit (INDEPENDENT_AMBULATORY_CARE_PROVIDER_SITE_OTHER)
Admission: RE | Admit: 2018-02-02 | Discharge: 2018-02-02 | Disposition: A | Discharge status: Home / Self Care | Payer: 59 | Source: Ambulatory Visit | Attending: Interventional Cardiology | Admitting: Interventional Cardiology

## 2018-02-02 DIAGNOSIS — I712 Thoracic aortic aneurysm, without rupture, unspecified: Secondary | ICD-10-CM

## 2018-02-02 MED ORDER — IOPAMIDOL (ISOVUE-370) INJECTION 76%
100.0000 mL | Freq: Once | INTRAVENOUS | Status: AC | PRN
  Administered 2018-02-02: 100 mL via INTRAVENOUS

## 2018-02-25 ENCOUNTER — Other Ambulatory Visit: Payer: Self-pay | Admitting: Interventional Cardiology

## 2018-04-02 ENCOUNTER — Other Ambulatory Visit: Payer: Self-pay | Admitting: Interventional Cardiology

## 2018-04-07 ENCOUNTER — Other Ambulatory Visit: Payer: Self-pay | Admitting: Interventional Cardiology

## 2018-04-26 DIAGNOSIS — Q828 Other specified congenital malformations of skin: Secondary | ICD-10-CM | POA: Diagnosis not present

## 2018-06-22 DIAGNOSIS — E039 Hypothyroidism, unspecified: Secondary | ICD-10-CM | POA: Diagnosis not present

## 2018-07-13 ENCOUNTER — Telehealth: Payer: Self-pay | Admitting: Interventional Cardiology

## 2018-07-13 MED ORDER — VALSARTAN 320 MG PO TABS: 320.0000 mg | ORAL_TABLET | Freq: Every day | ORAL | 3 refills | Status: DC

## 2018-07-13 NOTE — Telephone Encounter (Addendum)
Recommend switching to equivalent dose of valsartan 320mg  daily (recalled lots should have all been pulled by now and most pharmacies have had valsartan back in stock).

## 2018-07-13 NOTE — Telephone Encounter (Signed)
Pt's pharmacy is requesting a change on irbesartan 300 mg tablet, because medication is on backorder. [facility], [phone]. Please address

## 2018-07-13 NOTE — Telephone Encounter (Signed)
Rx changed to valsartan 320 mg QD. Called patient to make aware, but there was no answer. Left message for patient to call back. 

## 2018-07-13 NOTE — Telephone Encounter (Signed)
Returned call to [facility] and [facility] states that irbesartan 300 mg tablets are on manufacturer back order. She states that all of the irbesartan tablets are on back order and not available at other [facility]ies. They are requesting a Rx change. Will forward to PharmD.

## 2018-07-15 NOTE — Telephone Encounter (Signed)
Rx changed to valsartan 320 mg QD. Called patient to make aware, but there was no answer. Left message for patient to call back.

## 2018-07-15 NOTE — Telephone Encounter (Signed)
Patient made aware that his irbesartan was changed to valsartan 320 mg QD. Instructed patient to continue to monitor BP with med change. Patient verbalized understanding and thanked me for the call.

## 2018-07-15 NOTE — Telephone Encounter (Signed)
Follow up:    Pt returning your call from today.

## 2018-09-27 DIAGNOSIS — E039 Hypothyroidism, unspecified: Secondary | ICD-10-CM | POA: Diagnosis not present

## 2018-09-27 DIAGNOSIS — Q828 Other specified congenital malformations of skin: Secondary | ICD-10-CM | POA: Diagnosis not present

## 2019-01-03 NOTE — Progress Notes (Signed)
Cardiology Office Note    Date:  01/04/2019   ID:  [name], DOB [birth year], MRN [id number]  PCP:  Daisy Floro, MD  Cardiologist: Lance Muss, MD EPS: None  Chief Complaint  Patient presents with  . Follow-up    History of Present Illness:  [name] is a 53 y.o. male with history of bicuspid aortic valve, associated thoracic aortic aneurysm 4.4 cm on CT 01/2018, history of chest pain with normal cath 12-14-2016, father died of CHF and had a LVAD.  Last 2D echo 2017 normal LVEF 55 to 60% no aortic stenosis or AI.  Last saw Dr. Eldridge Dace 01/20/2018 doing well.  Patient comes in for routine follow-up.  Denies chest pain, palpitations, dyspnea, dyspnea on exertion, dizziness or presyncope.  Blood pressure has been running high at home about 120-130/90.  He does eat fast foods regularly.  He also says he is been agitated a lot lately which could be contributing.  No regular exercise.    Past Medical History:  Diagnosis Date  . ABDOMINAL PAIN-EPIGASTRIC   . COAGULOPATHY, COUMADIN-INDUCED   . DYSPHAGIA OROPHARYNGEAL PHASE   . Heartburn   . HYPERLIPIDEMIA   . HYPERTENSION   . HYPERTENSION NEC   . HYPOTHYROIDISM   . PE   . Primary hypercoagulable state (HCC)   . Thoracic aneurysm without mention of rupture   . Thoracic aortic aneurysm (HCC)   . VASCULITIS     Past Surgical History:  Procedure Laterality Date  . CARDIAC CATHETERIZATION N/A 12/08/2016   Procedure: Left Heart Cath and Coronary Angiography;  Surgeon: Lennette Bihari, MD;  Location: [facility] INVASIVE CV LAB;  Service: Cardiovascular;  Laterality: N/A;  . ELBOW SURGERY Left 2010    Current Medications: Current Meds  Medication Sig  . amLODipine (NORVASC) 10 MG tablet Take 1 tablet (10 mg total) by mouth daily.  Marland Kitchen aspirin 81 MG tablet Take 81 mg by mouth daily.  . clindamycin (CLEOCIN T) 1 % lotion Apply topically 2 (two) times daily.   Marland Kitchen levothyroxine (SYNTHROID, LEVOTHROID) 175 MCG tablet  Take 175 mcg by mouth daily before breakfast.  . metoprolol succinate (TOPROL-XL) 50 MG 24 hr tablet Take 1 tablet (50 mg total) by mouth daily.  . sertraline (ZOLOFT) 50 MG tablet Take 50 mg by mouth daily.  Marland Kitchen triamcinolone cream (KENALOG) 0.1 % Apply 1 application topically 2 (two) times daily.   . valsartan (DIOVAN) 320 MG tablet Take 1 tablet (320 mg total) by mouth daily.     Allergies:   Latex   Social History   Socioeconomic History  . Marital status: Single    Spouse name: Not on file  . Number of children: Not on file  . Years of education: Not on file  . Highest education level: Not on file  Occupational History  . Not on file  Social Needs  . Financial resource strain: Not on file  . Food insecurity:    Worry: Not on file    Inability: Not on file  . Transportation needs:    Medical: Not on file    Non-medical: Not on file  Tobacco Use  . Smoking status: Never Smoker  . Smokeless tobacco: Never Used  Substance and Sexual Activity  . Alcohol use: No  . Drug use: No  . Sexual activity: Not on file  Lifestyle  . Physical activity:    Days per week: Not on file    Minutes per session: Not on file  .  Stress: Not on file  Relationships  . Social connections:    Talks on phone: Not on file    Gets together: Not on file    Attends religious service: Not on file    Active member of club or organization: Not on file    Attends meetings of clubs or organizations: Not on file    Relationship status: Not on file  Other Topics Concern  . Not on file  Social History Narrative  . Not on file     Family History:  The patient's family history includes Heart disease in his mother; Hypertension in his father.   ROS:   Please see the history of present illness.    Review of Systems  Constitution: Negative.  HENT: Negative.   Cardiovascular: Negative.   Respiratory: Positive for cough.   Endocrine: Negative.   Hematologic/Lymphatic: Negative.   Musculoskeletal:  Negative.   Gastrointestinal: Negative.   Genitourinary: Negative.   Neurological: Negative.    All other systems reviewed and are negative.   PHYSICAL EXAM:   VS:  BP (!) 128/92   Pulse 60   Ht 6\' 3"  (1.905 m)   Wt 226 lb (102.5 kg)   BMI 28.25 kg/m   Physical Exam  GEN: Well nourished, well developed, in no acute distress  Neck: no JVD, carotid bruits, or masses Cardiac:RRR; 1/6 systolic murmur the left sternal border 1/6 diastolic murmur at the left sternal border Respiratory:  clear to auscultation bilaterally, normal work of breathing GI: soft, nontender, nondistended, + BS Ext: without cyanosis, clubbing, or edema, Good distal pulses bilaterally Neuro:  Alert and Oriented x 3 Psych: euthymic mood, full affect  Wt Readings from Last 3 Encounters:  01/04/19 226 lb (102.5 kg)  01/20/18 243 lb (110.2 kg)  01/22/17 222 lb (100.7 kg)      Studies/Labs Reviewed:   EKG:  EKG is  ordered today.  The ekg ordered today demonstrates normal sinus rhythm, normal EKG  Recent Labs: 01/20/2018: BUN 15; Creatinine, Ser 1.20; Potassium 3.6; Sodium 143   Lipid Panel    Component Value Date/Time   CHOL 140 12/07/2016 0247   TRIG 51 12/07/2016 0247   HDL 43 12/07/2016 0247   CHOLHDL 3.3 12/07/2016 0247   VLDL 10 12/07/2016 0247   LDLCALC 87 12/07/2016 0247    Additional studies/ records that were reviewed today include:   CT angiogram 02/02/2018   IMPRESSION: Stable aneurysmal disease of the ascending thoracic aorta which measures 4.4 cm in greatest diameter. No aortic dissection identified.   Aortic aneurysm NOS (ICD10-I71.9).     Electronically Signed   By: Irish Lack M.D.   On: 02/02/2018 16:26   FINDINGS: Cardiovascular: Stable mild dilatation of the ascending thoracic aorta which measures 4.4 cm in greatest diameter. The aortic root at the level of the sinuses of Valsalva measures 3.5 cm. The proximal arch measures 3.1 cm. The distal arch measures 2.1 cm.  The descending thoracic aorta measures 2.1 cm. No evidence of aortic dissection. No evidence of hemorrhage. Proximal great vessels show normal patency and normal branching anatomy.       Cardiac catheterization 12/08/2016  The left ventricular systolic function is normal.  LV end diastolic pressure is normal.   Normal left ventricular function without focal segmental wall motion abnormality.  Ejection fraction is approximately 65%.   Normal coronary arteries in a right dominant coronary circulation.   RECOMMENDATION: I suspect that the patient's nuclear stress test is a false positive due  to diaphragmatic attenuation.  Medical therapy for blood pressure control with follow-up surveillance of the patient's previously documented thoracic aortic aneurysm.    NST 12/17/2017IMPRESSION: 1. Cannot exclude inferior wall ischemia. It is felt that the inferior wall findings on the stress acquisition may be secondary to encroachment of the diaphragm and bowel activity on the inferior wall based on review of the raw planar images.   2. Normal left ventricular wall motion.   3. Left ventricular ejection fraction 64%   4. Non invasive risk stratification*: Intermediate      ASSESSMENT:    1. Bicuspid aortic valve   2. Thoracic aortic aneurysm without rupture (HCC)   3. Other chest pain   4. Essential hypertension   5. Hyperlipidemia, unspecified hyperlipidemia type      PLAN:  In order of problems listed above:  Bicuspid aortic valve.  No AS or AI on echo 2017 will update echo.  Thoracic aortic aneurysm 4.4 cm on CT 2019 due for repeat.  Follow-up with Dr. Eldridge Dace in 1 year.  History of chest pain and abnormal NST normal cardiac catheterization 2017  Essential hypertension blood pressure running high at home about 120-130/90.  eats a lot of fast food.  Would like to try 2 g sodium diet before increasing his medication.  He will call us if blood pressure remains  elevated.  Hyperlipidemia had labs drawn by PCP in October.  Try to get those labs.  Medication Adjustments/Labs and Tests Ordered: Current medicines are reviewed at length with the patient today.  Concerns regarding medicines are outlined above.  Medication changes, Labs and Tests ordered today are listed in the Patient Instructions below. Patient Instructions   Medication Instructions:  Your physician recommends that you continue on your current medications as directed. Please refer to the Current Medication list given to you today.  If you need a refill on your cardiac medications before your next appointment, please call your pharmacy.   Lab work: FUTURE: BMET 1 week before CT   If you have labs (blood work) drawn today and your tests are completely normal, you will receive your results only by: Marland Kitchen MyChart Message (if you have MyChart) OR . A paper copy in the mail If you have any lab test that is abnormal or we need to change your treatment, we will call you to review the results.  Testing/Procedures: Your physician has requested that you have cardiac CT. Cardiac computed tomography (CT) is a painless test that uses an x-ray machine to take clear, detailed pictures of your heart. For further information please visit [email or web address] Please follow instruction sheet as given.  Your physician has requested that you have an echocardiogram. Echocardiography is a painless test that uses sound waves to create images of your heart. It provides your doctor with information about the size and shape of your heart and how well your heart's chambers and valves are working. This procedure takes approximately one hour. There are no restrictions for this procedure.  Follow-Up: At [facility], you and your health needs are our priority.  As part of our continuing mission to provide you with exceptional heart care, we have created designated Provider Care Teams.  These Care Teams include your  primary Cardiologist (physician) and Advanced Practice Providers (APPs -  Physician Assistants and Nurse Practitioners) who all work together to provide you with the care you need, when you need it. You will need a follow up appointment in 1 years.  Please call our office 2  months in advance to schedule this appointment.  You may see Lance Muss, MD or one of the following Advanced Practice Providers on your designated Care Team:   [place], PA-C Ronie Spies, PA-C . Jacolyn Reedy, PA-C  Any Other Special Instructions Will Be Listed Below (If Applicable).  Two Gram Sodium Diet 2000 mg  What is Sodium? Sodium is a mineral found naturally in many foods. The most significant source of sodium in the diet is table salt, which is about 40% sodium.  Processed, convenience, and preserved foods also contain a large amount of sodium.  The body needs only 500 mg of sodium daily to function,  A normal diet provides more than enough sodium even if you do not use salt.  Why Limit Sodium? A build up of sodium in the body can cause thirst, increased blood pressure, shortness of breath, and water retention.  Decreasing sodium in the diet can reduce edema and risk of heart attack or stroke associated with high blood pressure.  Keep in mind that there are many other factors involved in these health problems.  Heredity, obesity, lack of exercise, cigarette smoking, stress and what you eat all play a role.  General Guidelines:  Do not add salt at the table or in cooking.  One teaspoon of salt contains over 2 grams of sodium.  Read food labels  Avoid processed and convenience foods  Ask your dietitian before eating any foods not dicussed in the menu planning guidelines  Consult your physician if you wish to use a salt substitute or a sodium containing medication such as antacids.  Limit milk and milk products to 16 oz (2 cups) per day.  Shopping Hints:  READ LABELS!! "Dietetic" does not  necessarily mean low sodium.  Salt and other sodium ingredients are often added to foods during processing.   Menu Planning Guidelines Food Group Choose More Often Avoid  Beverages (see also the milk group All fruit juices, low-sodium, salt-free vegetables juices, low-sodium carbonated beverages Regular vegetable or tomato juices, commercially softened water used for drinking or cooking  Breads and Cereals Enriched white, wheat, rye and pumpernickel bread, hard rolls and dinner rolls; muffins, cornbread and waffles; most dry cereals, cooked cereal without added salt; unsalted crackers and breadsticks; low sodium or homemade bread crumbs Bread, rolls and crackers with salted tops; quick breads; instant hot cereals; pancakes; commercial bread stuffing; self-rising flower and biscuit mixes; regular bread crumbs or cracker crumbs  Desserts and Sweets Desserts and sweets mad with mild should be within allowance Instant pudding mixes and cake mixes  Fats Butter or margarine; vegetable oils; unsalted salad dressings, regular salad dressings limited to 1 Tbs; light, sour and heavy cream Regular salad dressings containing bacon fat, bacon bits, and salt pork; snack dips made with instant soup mixes or processed cheese; salted nuts  Fruits Most fresh, frozen and canned fruits Fruits processed with salt or sodium-containing ingredient (some dried fruits are processed with sodium sulfites        Vegetables Fresh, frozen vegetables and low- sodium canned vegetables Regular canned vegetables, sauerkraut, pickled vegetables, and others prepared in brine; frozen vegetables in sauces; vegetables seasoned with ham, bacon or salt pork  Condiments, Sauces, Miscellaneous  Salt substitute with physician's approval; pepper, herbs, spices; vinegar, lemon or lime juice; hot pepper sauce; garlic powder, onion powder, low sodium soy sauce (1 Tbs.); low sodium condiments (ketchup, chili sauce, mustard) in limited amounts (1  tsp.) fresh ground horseradish; unsalted tortilla chips, pretzels, potato  chips, popcorn, salsa (1/4 cup) Any seasoning made with salt including garlic salt, celery salt, onion salt, and seasoned salt; sea salt, rock salt, kosher salt; meat tenderizers; monosodium glutamate; mustard, regular soy sauce, barbecue, sauce, chili sauce, teriyaki sauce, steak sauce, Worcestershire sauce, and most flavored vinegars; canned gravy and mixes; regular condiments; salted snack foods, olives, picles, relish, horseradish sauce, catsup   Food preparation: Try these seasonings Meats:    Pork Sage, onion Serve with applesauce  Chicken Poultry seasoning, thyme, parsley Serve with cranberry sauce  Lamb Curry powder, rosemary, garlic, thyme Serve with mint sauce or jelly  Veal Marjoram, basil Serve with current jelly, cranberry sauce  Beef Pepper, bay leaf Serve with dry mustard, unsalted chive butter  Fish [place] leaf, dill Serve with unsalted lemon butter, unsalted parsley butter  Vegetables:    Asparagus Lemon juice   Broccoli Lemon juice   Carrots Mustard dressing parsley, mint, nutmeg, glazed with unsalted butter and sugar   Green beans Marjoram, lemon juice, nutmeg,dill seed   Tomatoes Basil, marjoram, onion   Spice /blend for [employer]" 4 tsp ground thyme 1 tsp ground sage 3 tsp ground rosemary 4 tsp ground marjoram   Test your knowledge 1. A product that says "Salt Free" may still contain sodium. True or False 2. Garlic Powder and Hot Pepper Sauce an be used as alternative seasonings.True or False 3. Processed foods have more sodium than fresh foods.  True or False 4. Canned Vegetables have less sodium than froze True or False  WAYS TO DECREASE YOUR SODIUM INTAKE 1. Avoid the use of added salt in cooking and at the table.  Table salt (and other prepared seasonings which contain salt) is probably one of the greatest sources of sodium in the diet.  Unsalted foods can gain flavor from the sweet, sour,  and butter taste sensations of herbs and spices.  Instead of using salt for seasoning, try the following seasonings with the foods listed.  Remember: how you use them to enhance natural food flavors is limited only by your creativity... Allspice-Meat, fish, eggs, fruit, peas, red and yellow vegetables Almond Extract-Fruit baked goods Anise Seed-Sweet breads, fruit, carrots, beets, cottage cheese, cookies (tastes like licorice) Basil-Meat, fish, eggs, vegetables, rice, vegetables salads, soups, sauces Bay Leaf-Meat, fish, stews, poultry Burnet-Salad, vegetables (cucumber-like flavor) Caraway Seed-Bread, cookies, cottage cheese, meat, vegetables, cheese, rice Cardamon-Baked goods, fruit, soups Celery Powder or seed-Salads, salad dressings, sauces, meatloaf, soup, bread.Do not use  celery salt Chervil-Meats, salads, fish, eggs, vegetables, cottage cheese (parsley-like flavor) Chili Power-Meatloaf, chicken cheese, corn, eggplant, egg dishes Chives-Salads cottage cheese, egg dishes, soups, vegetables, sauces Cilantro-Salsa, casseroles Cinnamon-Baked goods, fruit, pork, lamb, chicken, carrots Cloves-Fruit, baked goods, fish, pot roast, green beans, beets, carrots Coriander-Pastry, cookies, meat, salads, cheese (lemon-orange flavor) Cumin-Meatloaf, fish,cheese, eggs, cabbage,fruit pie (caraway flavor) [employer], fruit, eggs, fish, poultry, cottage cheese, vegetables Dill Seed-Meat, cottage cheese, poultry, vegetables, fish, salads, bread Fennel Seed-Bread, cookies, apples, pork, eggs, fish, beets, cabbage, cheese, Licorice-like flavor Garlic-(buds or powder) Salads, meat, poultry, fish, bread, butter, vegetables, potatoes.Do not  use garlic salt Ginger-Fruit, vegetables, baked goods, meat, fish, poultry Horseradish Root-Meet, vegetables, butter Lemon Juice or Extract-Vegetables, fruit, tea, baked goods, fish salads Mace-Baked goods fruit, vegetables, fish, poultry (taste like nutmeg) Maple  Extract-Syrups Marjoram-Meat, chicken, fish, vegetables, breads, green salads (taste like Sage) Mint-Tea, lamb, sherbet, vegetables, desserts, carrots, cabbage Mustard, Dry or Seed-Cheese, eggs, meats, vegetables, poultry Nutmeg-Baked goods, fruit, chicken, eggs, vegetables, desserts Onion Powder-Meat, fish, poultry, vegetables, cheese,  eggs, bread, rice salads (Do not use   Onion salt) Orange Extract-Desserts, baked goods Oregano-Pasta, eggs, cheese, onions, pork, lamb, fish, chicken, vegetables, green salads Paprika-Meat, fish, poultry, eggs, cheese, vegetables Parsley Flakes-Butter, vegetables, meat fish, poultry, eggs, bread, salads (certain forms may   Contain sodium Pepper-Meat fish, poultry, vegetables, eggs Peppermint Extract-Desserts, baked goods Poppy Seed-Eggs, bread, cheese, fruit dressings, baked goods, noodles, vegetables, cottage  [employer] RxCheese Poultry Seasoning-Poultry,veal Rosemary-Lamb, poultry, meat, fish, cauliflower, turnips,eggs bread Saffron-Rice, bread, veal, chicken, fish, eggs Sage-Meat, fish, poultry, onions, eggplant, tomateos, pork, stews Savory-Eggs, salads, poultry, meat, rice, vegetables, soups, pork Tarragon-Meat, poultry, fish, eggs, butter, vegetables (licorice-like flavor)  Thyme-Meat, poultry, fish, eggs, vegetables, (clover-like flavor), sauces, soups Tumeric-Salads, butter, eggs, fish, rice, vegetables (saffron-like flavor) Vanilla Extract-Baked goods, candy Vinegar-Salads, vegetables, meat marinades Walnut Extract-baked goods, candy  2. Choose your Foods Wisely   The following is a list of foods to avoid which are high in sodium:  Meats-Avoid all smoked, canned, salt cured, dried and kosher meat and fish as well as Anchovies   Lox Freescale SemiconductorBacon    Luncheon meats:Bologna, Liverwurst, Pastrami Canned meat or fish  Marinated herring Caviar    Pepperoni Corned Beef   Pizza Dried chipped beef  Salami Frozen breaded fish or meat Salt pork Frankfurters or hot  dogs  Sardines Gefilte fish   Sausage Ham (boiled ham, Proscuitto Smoked butt    spiced ham)   Spam      TV Dinners Vegetables Canned vegetables (Regular) Relish Canned mushrooms  Sauerkraut Olives    Tomato juice Pickles  Bakery and Dessert Products Canned puddings  Cream pies Cheesecake   Decorated cakes Cookies  Beverages/Juices Tomato juice, regular  Gatorade   V-8 vegetable juice, regular  Breads and Cereals Biscuit mixes   Salted potato chips, corn chips, pretzels Bread stuffing mixes  Salted crackers and rolls Pancake and waffle mixes Self-rising flour  Seasonings Accent    Meat sauces Barbecue sauce  Meat tenderizer Catsup    Monosodium glutamate (MSG) Celery salt   Onion salt Chili sauce   Prepared mustard Garlic salt   Salt, seasoned salt, sea salt Gravy mixes   Soy sauce Horseradish   Steak sauce Ketchup   Tartar sauce Lite salt    Teriyaki sauce Marinade mixes   Worcestershire sauce  Others Baking powder   Cocoa and cocoa mixes Baking soda   Commercial casserole mixes Candy-caramels, chocolate  Dehydrated soups    Bars, fudge,nougats  Instant rice and pasta mixes Canned broth or soup  Maraschino cherries Cheese, aged and processed cheese and cheese spreads  Learning Assessment Quiz  Indicated T (for True) or F (for False) for each of the following statements:  1. _____ Fresh fruits and vegetables and unprocessed grains are generally low in sodium 2. _____ Water may contain a considerable amount of sodium, depending on the source 3. _____ You can always tell if a food is high in sodium by tasting it 4. _____ Certain laxatives my be high in sodium and should be avoided unless prescribed   by a physician or pharmacist 5. _____ Salt substitutes may be used freely by anyone on a sodium restricted diet 6. _____ Sodium is present in table salt, food additives and as a natural component of   most foods 7. _____ Table salt is approximately 90%  sodium 8. _____ Limiting sodium intake may help prevent excess fluid accumulation in the body 9. _____ On a sodium-restricted diet, seasonings such as bouillon soy sauce, and  cooking wine should be used in place of table salt 10. _____ On an ingredient list, a product which lists monosodium glutamate as the first   ingredient is an appropriate food to include on a low sodium diet  Circle the best answer(s) to the following statements (Hint: there may be more than one correct answer)  11. On a low-sodium diet, some acceptable snack items are:    A. Olives  F. Bean dip   K. Grapefruit juice    B. Salted Pretzels G. Commercial Popcorn   L. Canned peaches    C. Carrot Sticks  H. Bouillon   M. Unsalted nuts   D. [place] fries  I. Peanut butter crackers N. Salami   E. Sweet pickles J. Tomato Juice   O. Pizza  12.  Seasonings that may be used freely on a reduced - sodium diet include   A. Lemon wedges F.Monosodium glutamate K. Celery seed    B.Soysauce   G. Pepper   L. Mustard powder   C. Sea salt  H. Cooking wine  M. Onion flakes   D. Vinegar  E. Prepared horseradish N. Salsa   E. Sage   J. Worcestershire sauce  O. [address]     Elson Clan, PA-C  01/04/2019 12:50 PM    [facility] [address] [place], [place], [place]  [address] Phone: [phone]; Fax: [phone]

## 2019-01-04 ENCOUNTER — Encounter: Payer: Self-pay | Admitting: Physician Assistant

## 2019-01-04 ENCOUNTER — Ambulatory Visit (INDEPENDENT_AMBULATORY_CARE_PROVIDER_SITE_OTHER): Payer: BLUE CROSS/BLUE SHIELD | Admitting: Physician Assistant

## 2019-01-04 VITALS — BP 128/92 | HR 60 | Ht 75.0 in | Wt 226.0 lb

## 2019-01-04 DIAGNOSIS — I712 Thoracic aortic aneurysm, without rupture, unspecified: Secondary | ICD-10-CM

## 2019-01-04 DIAGNOSIS — Q231 Congenital insufficiency of aortic valve: Secondary | ICD-10-CM | POA: Diagnosis not present

## 2019-01-04 DIAGNOSIS — R0789 Other chest pain: Secondary | ICD-10-CM

## 2019-01-04 DIAGNOSIS — I10 Essential (primary) hypertension: Secondary | ICD-10-CM | POA: Diagnosis not present

## 2019-01-04 DIAGNOSIS — E785 Hyperlipidemia, unspecified: Secondary | ICD-10-CM

## 2019-01-04 NOTE — Patient Instructions (Addendum)
Medication Instructions:  Your physician recommends that you continue on your current medications as directed. Please refer to the Current Medication list given to you today.  If you need a refill on your cardiac medications before your next appointment, please call your pharmacy.   Lab work: FUTURE: BMET 1 week before CT   If you have labs (blood work) drawn today and your tests are completely normal, you will receive your results only by: Marland Kitchen MyChart Message (if you have MyChart) OR . A paper copy in the mail If you have any lab test that is abnormal or we need to change your treatment, we will call you to review the results.  Testing/Procedures: Your physician has requested that you have cardiac CT. Cardiac computed tomography (CT) is a painless test that uses an x-ray machine to take clear, detailed pictures of your heart. For further information please visit [email or web address] Please follow instruction sheet as given.  Your physician has requested that you have an echocardiogram. Echocardiography is a painless test that uses sound waves to create images of your heart. It provides your doctor with information about the size and shape of your heart and how well your heart's chambers and valves are working. This procedure takes approximately one hour. There are no restrictions for this procedure.  Follow-Up: At [facility], you and your health needs are our priority.  As part of our continuing mission to provide you with exceptional heart care, we have created designated Provider Care Teams.  These Care Teams include your primary Cardiologist (physician) and Advanced Practice Providers (APPs -  Physician Assistants and Nurse Practitioners) who all work together to provide you with the care you need, when you need it. You will need a follow up appointment in 1 years.  Please call our office 2 months in advance to schedule this appointment.  You may see Lance Muss, MD or one of the  following Advanced Practice Providers on your designated Care Team:   [place], PA-C Ronie Spies, PA-C . Jacolyn Reedy, PA-C  Any Other Special Instructions Will Be Listed Below (If Applicable).  Two Gram Sodium Diet 2000 mg  What is Sodium? Sodium is a mineral found naturally in many foods. The most significant source of sodium in the diet is table salt, which is about 40% sodium.  Processed, convenience, and preserved foods also contain a large amount of sodium.  The body needs only 500 mg of sodium daily to function,  A normal diet provides more than enough sodium even if you do not use salt.  Why Limit Sodium? A build up of sodium in the body can cause thirst, increased blood pressure, shortness of breath, and water retention.  Decreasing sodium in the diet can reduce edema and risk of heart attack or stroke associated with high blood pressure.  Keep in mind that there are many other factors involved in these health problems.  Heredity, obesity, lack of exercise, cigarette smoking, stress and what you eat all play a role.  General Guidelines:  Do not add salt at the table or in cooking.  One teaspoon of salt contains over 2 grams of sodium.  Read food labels  Avoid processed and convenience foods  Ask your dietitian before eating any foods not dicussed in the menu planning guidelines  Consult your physician if you wish to use a salt substitute or a sodium containing medication such as antacids.  Limit milk and milk products to 16 oz (2 cups) per day.  Shopping  Hints:  READ LABELS!! "Dietetic" does not necessarily mean low sodium.  Salt and other sodium ingredients are often added to foods during processing.   Menu Planning Guidelines Food Group Choose More Often Avoid  Beverages (see also the milk group All fruit juices, low-sodium, salt-free vegetables juices, low-sodium carbonated beverages Regular vegetable or tomato juices, commercially softened water used for  drinking or cooking  Breads and Cereals Enriched white, wheat, rye and pumpernickel bread, hard rolls and dinner rolls; muffins, cornbread and waffles; most dry cereals, cooked cereal without added salt; unsalted crackers and breadsticks; low sodium or homemade bread crumbs Bread, rolls and crackers with salted tops; quick breads; instant hot cereals; pancakes; commercial bread stuffing; self-rising flower and biscuit mixes; regular bread crumbs or cracker crumbs  Desserts and Sweets Desserts and sweets mad with mild should be within allowance Instant pudding mixes and cake mixes  Fats Butter or margarine; vegetable oils; unsalted salad dressings, regular salad dressings limited to 1 Tbs; light, sour and heavy cream Regular salad dressings containing bacon fat, bacon bits, and salt pork; snack dips made with instant soup mixes or processed cheese; salted nuts  Fruits Most fresh, frozen and canned fruits Fruits processed with salt or sodium-containing ingredient (some dried fruits are processed with sodium sulfites        Vegetables Fresh, frozen vegetables and low- sodium canned vegetables Regular canned vegetables, sauerkraut, pickled vegetables, and others prepared in brine; frozen vegetables in sauces; vegetables seasoned with ham, bacon or salt pork  Condiments, Sauces, Miscellaneous  Salt substitute with physician's approval; pepper, herbs, spices; vinegar, lemon or lime juice; hot pepper sauce; garlic powder, onion powder, low sodium soy sauce (1 Tbs.); low sodium condiments (ketchup, chili sauce, mustard) in limited amounts (1 tsp.) fresh ground horseradish; unsalted tortilla chips, pretzels, potato chips, popcorn, salsa (1/4 cup) Any seasoning made with salt including garlic salt, celery salt, onion salt, and seasoned salt; sea salt, rock salt, kosher salt; meat tenderizers; monosodium glutamate; mustard, regular soy sauce, barbecue, sauce, chili sauce, teriyaki sauce, steak sauce,  Worcestershire sauce, and most flavored vinegars; canned gravy and mixes; regular condiments; salted snack foods, olives, picles, relish, horseradish sauce, catsup   Food preparation: Try these seasonings Meats:    Pork Sage, onion Serve with applesauce  Chicken Poultry seasoning, thyme, parsley Serve with cranberry sauce  Lamb Curry powder, rosemary, garlic, thyme Serve with mint sauce or jelly  Veal Marjoram, basil Serve with current jelly, cranberry sauce  Beef Pepper, bay leaf Serve with dry mustard, unsalted chive butter  Fish [place] leaf, dill Serve with unsalted lemon butter, unsalted parsley butter  Vegetables:    Asparagus Lemon juice   Broccoli Lemon juice   Carrots Mustard dressing parsley, mint, nutmeg, glazed with unsalted butter and sugar   Green beans Marjoram, lemon juice, nutmeg,dill seed   Tomatoes Basil, marjoram, onion   Spice /blend for [employer]"Salt Shaker" 4 tsp ground thyme 1 tsp ground sage 3 tsp ground rosemary 4 tsp ground marjoram   Test your knowledge 1. A product that says "Salt Free" may still contain sodium. True or False 2. Garlic Powder and Hot Pepper Sauce an be used as alternative seasonings.True or False 3. Processed foods have more sodium than fresh foods.  True or False 4. Canned Vegetables have less sodium than froze True or False  WAYS TO DECREASE YOUR SODIUM INTAKE 1. Avoid the use of added salt in cooking and at the table.  Table salt (and other prepared seasonings which contain  salt) is probably one of the greatest sources of sodium in the diet.  Unsalted foods can gain flavor from the sweet, sour, and butter taste sensations of herbs and spices.  Instead of using salt for seasoning, try the following seasonings with the foods listed.  Remember: how you use them to enhance natural food flavors is limited only by your creativity... Allspice-Meat, fish, eggs, fruit, peas, red and yellow vegetables Almond Extract-Fruit baked goods Anise Seed-Sweet breads,  fruit, carrots, beets, cottage cheese, cookies (tastes like licorice) Basil-Meat, fish, eggs, vegetables, rice, vegetables salads, soups, sauces Bay Leaf-Meat, fish, stews, poultry Burnet-Salad, vegetables (cucumber-like flavor) Caraway Seed-Bread, cookies, cottage cheese, meat, vegetables, cheese, rice Cardamon-Baked goods, fruit, soups Celery Powder or seed-Salads, salad dressings, sauces, meatloaf, soup, bread.Do not use  celery salt Chervil-Meats, salads, fish, eggs, vegetables, cottage cheese (parsley-like flavor) Chili Power-Meatloaf, chicken cheese, corn, eggplant, egg dishes Chives-Salads cottage cheese, egg dishes, soups, vegetables, sauces Cilantro-Salsa, casseroles Cinnamon-Baked goods, fruit, pork, lamb, chicken, carrots Cloves-Fruit, baked goods, fish, pot roast, green beans, beets, carrots Coriander-Pastry, cookies, meat, salads, cheese (lemon-orange flavor) Cumin-Meatloaf, fish,cheese, eggs, cabbage,fruit pie (caraway flavor) [employer], fruit, eggs, fish, poultry, cottage cheese, vegetables Dill Seed-Meat, cottage cheese, poultry, vegetables, fish, salads, bread Fennel Seed-Bread, cookies, apples, pork, eggs, fish, beets, cabbage, cheese, Licorice-like flavor Garlic-(buds or powder) Salads, meat, poultry, fish, bread, butter, vegetables, potatoes.Do not  use garlic salt Ginger-Fruit, vegetables, baked goods, meat, fish, poultry Horseradish Root-Meet, vegetables, butter Lemon Juice or Extract-Vegetables, fruit, tea, baked goods, fish salads Mace-Baked goods fruit, vegetables, fish, poultry (taste like nutmeg) Maple Extract-Syrups Marjoram-Meat, chicken, fish, vegetables, breads, green salads (taste like Sage) Mint-Tea, lamb, sherbet, vegetables, desserts, carrots, cabbage Mustard, Dry or Seed-Cheese, eggs, meats, vegetables, poultry Nutmeg-Baked goods, fruit, chicken, eggs, vegetables, desserts Onion Powder-Meat, fish, poultry, vegetables, cheese, eggs, bread, rice  salads (Do not use   Onion salt) Orange Extract-Desserts, baked goods Oregano-Pasta, eggs, cheese, onions, pork, lamb, fish, chicken, vegetables, green salads Paprika-Meat, fish, poultry, eggs, cheese, vegetables Parsley Flakes-Butter, vegetables, meat fish, poultry, eggs, bread, salads (certain forms may   Contain sodium Pepper-Meat fish, poultry, vegetables, eggs Peppermint Extract-Desserts, baked goods Poppy Seed-Eggs, bread, cheese, fruit dressings, baked goods, noodles, vegetables, cottage  [employer], poultry, meat, fish, cauliflower, turnips,eggs bread Saffron-Rice, bread, veal, chicken, fish, eggs Sage-Meat, fish, poultry, onions, eggplant, tomateos, pork, stews Savory-Eggs, salads, poultry, meat, rice, vegetables, soups, pork Tarragon-Meat, poultry, fish, eggs, butter, vegetables (licorice-like flavor)  Thyme-Meat, poultry, fish, eggs, vegetables, (clover-like flavor), sauces, soups Tumeric-Salads, butter, eggs, fish, rice, vegetables (saffron-like flavor) Vanilla Extract-Baked goods, candy Vinegar-Salads, vegetables, meat marinades Walnut Extract-baked goods, candy  2. Choose your Foods Wisely   The following is a list of foods to avoid which are high in sodium:  Meats-Avoid all smoked, canned, salt cured, dried and kosher meat and fish as well as Anchovies   [employer] meats:Bologna, Liverwurst, Pastrami Canned meat or fish  Marinated herring Caviar    Pepperoni Corned Beef   Pizza Dried chipped beef  Salami Frozen breaded fish or meat Salt pork Frankfurters or hot dogs  Sardines Gefilte fish   Sausage Ham (boiled ham, Proscuitto Smoked butt    spiced ham)   Spam      TV Dinners Vegetables Canned vegetables (Regular) Relish Canned mushrooms  Sauerkraut Olives    Tomato juice Pickles  Bakery and Dessert Products Canned puddings  Cream pies Cheesecake   Decorated cakes Cookies  Beverages/Juices Tomato  juice, regular  Gatorade  V-8 vegetable juice, regular  Breads and Cereals Biscuit mixes   Salted potato chips, corn chips, pretzels Bread stuffing mixes  Salted crackers and rolls Pancake and waffle mixes Self-rising flour  Seasonings Accent    Meat sauces Barbecue sauce  Meat tenderizer Catsup    Monosodium glutamate (MSG) Celery salt   Onion salt Chili sauce   Prepared mustard Garlic salt   Salt, seasoned salt, sea salt Gravy mixes   Soy sauce Horseradish   Steak sauce Ketchup   Tartar sauce Lite salt    Teriyaki sauce Marinade mixes   Worcestershire sauce  Others Baking powder   Cocoa and cocoa mixes Baking soda   Commercial casserole mixes Candy-caramels, chocolate  Dehydrated soups    Bars, fudge,nougats  Instant rice and pasta mixes Canned broth or soup  Maraschino cherries Cheese, aged and processed cheese and cheese spreads  Learning Assessment Quiz  Indicated T (for True) or F (for False) for each of the following statements:  1. _____ Fresh fruits and vegetables and unprocessed grains are generally low in sodium 2. _____ Water may contain a considerable amount of sodium, depending on the source 3. _____ You can always tell if a food is high in sodium by tasting it 4. _____ Certain laxatives my be high in sodium and should be avoided unless prescribed   by a physician or pharmacist 5. _____ Salt substitutes may be used freely by anyone on a sodium restricted diet 6. _____ Sodium is present in table salt, food additives and as a natural component of   most foods 7. _____ Table salt is approximately 90% sodium 8. _____ Limiting sodium intake may help prevent excess fluid accumulation in the body 9. _____ On a sodium-restricted diet, seasonings such as bouillon soy sauce, and    cooking wine should be used in place of table salt 10. _____ On an ingredient list, a product which lists monosodium glutamate as the first   ingredient is an appropriate food to include on a  low sodium diet  Circle the best answer(s) to the following statements (Hint: there may be more than one correct answer)  11. On a low-sodium diet, some acceptable snack items are:    A. Olives  F. Bean dip   K. Grapefruit juice    B. Salted Pretzels G. Commercial Popcorn   L. Canned peaches    C. Carrot Sticks  H. Bouillon   M. Unsalted nuts   D. [place]French fries  I. Peanut butter crackers N. Salami   E. Sweet pickles J. Tomato Juice   O. Pizza  12.  Seasonings that may be used freely on a reduced - sodium diet include   A. Lemon wedges F.Monosodium glutamate K. Celery seed    B.Soysauce   G. Pepper   L. Mustard powder   C. Sea salt  H. Cooking wine  M. Onion flakes   D. Vinegar  E. Prepared horseradish N. Salsa   E. Sage   J. Worcestershire sauce  O. Chutney

## 2019-01-05 ENCOUNTER — Ambulatory Visit: Payer: 59 | Admitting: Physician Assistant

## 2019-01-27 ENCOUNTER — Other Ambulatory Visit: Payer: BLUE CROSS/BLUE SHIELD | Admitting: *Deleted

## 2019-01-27 DIAGNOSIS — I712 Thoracic aortic aneurysm, without rupture, unspecified: Secondary | ICD-10-CM

## 2019-01-27 LAB — BASIC METABOLIC PANEL
BUN/Creatinine Ratio: 14 (ref 9–20)
BUN: 13 mg/dL (ref 6–24)
CO2: 26 mmol/L (ref 20–29)
CREATININE: 0.96 mg/dL (ref 0.76–1.27)
Calcium: 9.3 mg/dL (ref 8.7–10.2)
Chloride: 104 mmol/L (ref 96–106)
GFR calc Af Amer: 106 mL/min/{1.73_m2} (ref >59)
GFR, EST NON AFRICAN AMERICAN: 92 mL/min/{1.73_m2} (ref >59)
GLUCOSE: 92 mg/dL (ref 65–99)
Potassium: 3.3 mmol/L — ABNORMAL LOW (ref 3.5–5.2)
Sodium: 145 mmol/L — ABNORMAL HIGH (ref 134–144)

## 2019-02-03 ENCOUNTER — Ambulatory Visit (INDEPENDENT_AMBULATORY_CARE_PROVIDER_SITE_OTHER)
Admission: RE | Admit: 2019-02-03 | Discharge: 2019-02-03 | Disposition: A | Discharge status: Home / Self Care | Payer: BLUE CROSS/BLUE SHIELD | Source: Ambulatory Visit | Attending: Physician Assistant | Admitting: Physician Assistant

## 2019-02-03 ENCOUNTER — Other Ambulatory Visit: Payer: Self-pay

## 2019-02-03 ENCOUNTER — Ambulatory Visit (HOSPITAL_COMMUNITY): Payer: BLUE CROSS/BLUE SHIELD | Attending: Cardiology

## 2019-02-03 DIAGNOSIS — I712 Thoracic aortic aneurysm, without rupture, unspecified: Secondary | ICD-10-CM

## 2019-02-03 DIAGNOSIS — Q231 Congenital insufficiency of aortic valve: Secondary | ICD-10-CM | POA: Diagnosis not present

## 2019-02-03 DIAGNOSIS — Z01812 Encounter for preprocedural laboratory examination: Secondary | ICD-10-CM

## 2019-02-03 MED ORDER — IOPAMIDOL (ISOVUE-370) INJECTION 76%
100.0000 mL | Freq: Once | INTRAVENOUS | Status: AC | PRN
  Administered 2019-02-03: 100 mL via INTRAVENOUS

## 2019-02-06 ENCOUNTER — Telehealth: Payer: Self-pay | Admitting: Interventional Cardiology

## 2019-02-06 NOTE — Telephone Encounter (Signed)
Patient is calling for echo results 

## 2019-02-07 NOTE — Telephone Encounter (Signed)
Left message for patient to call back  

## 2019-02-13 NOTE — Telephone Encounter (Signed)
Left message for patient to call back regarding echo and lab results.

## 2019-02-15 ENCOUNTER — Other Ambulatory Visit: Payer: Self-pay | Admitting: Interventional Cardiology

## 2019-02-17 ENCOUNTER — Other Ambulatory Visit: Payer: Self-pay

## 2019-02-17 DIAGNOSIS — E876 Hypokalemia: Secondary | ICD-10-CM

## 2019-02-17 NOTE — Telephone Encounter (Signed)
-----   Message from Dyann Kief, [place] sent at 02/06/2019  8:43 AM EST ----- Echo looks good with normal heart function, no aortic valve problems and stable aneurysm. No changes

## 2019-02-17 NOTE — Telephone Encounter (Signed)
The patient has been notified of the labs and echo results and verbalized understanding. patient weill eat foods that are high in potassium. Patient is taking is ARB. Will repeat BMET on 02/24/19. All questions (if any) were answered.

## 2019-02-17 NOTE — Telephone Encounter (Signed)
-----   Message from Dyann Kief, PA-C sent at 01/30/2019  7:53 AM EST ----- Agree with increase of potassium in diet.  Make sure he's taking ARB. Recheck bmet in 2 weeks

## 2019-02-24 ENCOUNTER — Other Ambulatory Visit: Payer: BLUE CROSS/BLUE SHIELD | Admitting: *Deleted

## 2019-02-24 DIAGNOSIS — E876 Hypokalemia: Secondary | ICD-10-CM

## 2019-02-24 LAB — BASIC METABOLIC PANEL
BUN / CREAT RATIO: 13 (ref 9–20)
BUN: 12 mg/dL (ref 6–24)
CO2: 25 mmol/L (ref 20–29)
CREATININE: 0.95 mg/dL (ref 0.76–1.27)
Calcium: 9.2 mg/dL (ref 8.7–10.2)
Chloride: 103 mmol/L (ref 96–106)
GFR calc non Af Amer: 93 mL/min/{1.73_m2} (ref >59)
GFR, EST AFRICAN AMERICAN: 107 mL/min/{1.73_m2} (ref >59)
Glucose: 88 mg/dL (ref 65–99)
POTASSIUM: 4.2 mmol/L (ref 3.5–5.2)
SODIUM: 143 mmol/L (ref 134–144)

## 2019-04-23 ENCOUNTER — Other Ambulatory Visit: Payer: Self-pay

## 2019-04-23 ENCOUNTER — Emergency Department (HOSPITAL_COMMUNITY)
Admission: EM | Admit: 2019-04-23 | Discharge: 2019-04-23 | Disposition: A | Discharge status: Home / Self Care | Payer: BLUE CROSS/BLUE SHIELD | Attending: Emergency Medicine | Admitting: Emergency Medicine

## 2019-04-23 ENCOUNTER — Emergency Department (HOSPITAL_COMMUNITY): Payer: BLUE CROSS/BLUE SHIELD

## 2019-04-23 ENCOUNTER — Encounter (HOSPITAL_COMMUNITY): Payer: Self-pay | Admitting: Pharmacy Technician

## 2019-04-23 DIAGNOSIS — I10 Essential (primary) hypertension: Secondary | ICD-10-CM | POA: Diagnosis not present

## 2019-04-23 DIAGNOSIS — E876 Hypokalemia: Secondary | ICD-10-CM | POA: Diagnosis not present

## 2019-04-23 DIAGNOSIS — R197 Diarrhea, unspecified: Secondary | ICD-10-CM | POA: Diagnosis not present

## 2019-04-23 DIAGNOSIS — R070 Pain in throat: Secondary | ICD-10-CM | POA: Diagnosis not present

## 2019-04-23 DIAGNOSIS — Z209 Contact with and (suspected) exposure to unspecified communicable disease: Secondary | ICD-10-CM | POA: Diagnosis not present

## 2019-04-23 DIAGNOSIS — E039 Hypothyroidism, unspecified: Secondary | ICD-10-CM | POA: Diagnosis not present

## 2019-04-23 DIAGNOSIS — R200 Anesthesia of skin: Secondary | ICD-10-CM | POA: Insufficient documentation

## 2019-04-23 DIAGNOSIS — R110 Nausea: Secondary | ICD-10-CM | POA: Insufficient documentation

## 2019-04-23 DIAGNOSIS — R0602 Shortness of breath: Secondary | ICD-10-CM | POA: Insufficient documentation

## 2019-04-23 DIAGNOSIS — Z79899 Other long term (current) drug therapy: Secondary | ICD-10-CM | POA: Insufficient documentation

## 2019-04-23 DIAGNOSIS — Z86711 Personal history of pulmonary embolism: Secondary | ICD-10-CM | POA: Diagnosis not present

## 2019-04-23 DIAGNOSIS — R0789 Other chest pain: Secondary | ICD-10-CM | POA: Insufficient documentation

## 2019-04-23 DIAGNOSIS — R05 Cough: Secondary | ICD-10-CM | POA: Insufficient documentation

## 2019-04-23 DIAGNOSIS — R079 Chest pain, unspecified: Secondary | ICD-10-CM | POA: Diagnosis not present

## 2019-04-23 LAB — URINALYSIS, ROUTINE W REFLEX MICROSCOPIC
Bacteria, UA: NONE SEEN
Glucose, UA: NEGATIVE mg/dL
Ketones, ur: 20 mg/dL — AB
Leukocytes,Ua: NEGATIVE
Nitrite: NEGATIVE
Protein, ur: 30 mg/dL — AB
Specific Gravity, Urine: 1.033 — ABNORMAL HIGH (ref 1.005–1.030)
pH: 5 (ref 5.0–8.0)

## 2019-04-23 LAB — CBC WITH DIFFERENTIAL/PLATELET
Abs Immature Granulocytes: 0.02 10*3/uL (ref 0.00–0.07)
Basophils Absolute: 0 10*3/uL (ref 0.0–0.1)
Basophils Relative: 0 %
Eosinophils Absolute: 0 10*3/uL (ref 0.0–0.5)
Eosinophils Relative: 0 %
HCT: 45.7 % (ref 39.0–52.0)
Hemoglobin: 16.3 g/dL (ref 13.0–17.0)
Immature Granulocytes: 0 %
Lymphocytes Relative: 22 %
Lymphs Abs: 1.8 10*3/uL (ref 0.7–4.0)
MCH: 32.5 pg (ref 26.0–34.0)
MCHC: 35.7 g/dL (ref 30.0–36.0)
MCV: 91 fL (ref 80.0–100.0)
Monocytes Absolute: 0.8 10*3/uL (ref 0.1–1.0)
Monocytes Relative: 10 %
Neutro Abs: 5.3 10*3/uL (ref 1.7–7.7)
Neutrophils Relative %: 68 %
Platelets: 221 10*3/uL (ref 150–400)
RBC: 5.02 MIL/uL (ref 4.22–5.81)
RDW: 13.1 % (ref 11.5–15.5)
WBC: 7.9 10*3/uL (ref 4.0–10.5)
nRBC: 0 % (ref 0.0–0.2)

## 2019-04-23 LAB — COMPREHENSIVE METABOLIC PANEL
ALT: 21 U/L (ref 0–44)
AST: 28 U/L (ref 15–41)
Albumin: 3.8 g/dL (ref 3.5–5.0)
Alkaline Phosphatase: 52 U/L (ref 38–126)
Anion gap: 14 (ref 5–15)
BUN: 20 mg/dL (ref 6–20)
CO2: 24 mmol/L (ref 22–32)
Calcium: 9.8 mg/dL (ref 8.9–10.3)
Chloride: 105 mmol/L (ref 98–111)
Creatinine, Ser: 1.27 mg/dL — ABNORMAL HIGH (ref 0.61–1.24)
GFR calc Af Amer: >60 mL/min (ref >60)
GFR calc non Af Amer: >60 mL/min (ref >60)
Glucose, Bld: 89 mg/dL (ref 70–99)
Potassium: 2.8 mmol/L — ABNORMAL LOW (ref 3.5–5.1)
Sodium: 143 mmol/L (ref 135–145)
Total Bilirubin: 1.4 mg/dL — ABNORMAL HIGH (ref 0.3–1.2)
Total Protein: 7.1 g/dL (ref 6.5–8.1)

## 2019-04-23 LAB — TROPONIN I
Troponin I: <0.03 ng/mL (ref <0.03)
Troponin I: <0.03 ng/mL (ref <0.03)

## 2019-04-23 MED ORDER — SODIUM CHLORIDE 0.9 % IV BOLUS
1000.0000 mL | Freq: Once | INTRAVENOUS | Status: AC
  Administered 2019-04-23: 19:00:00 1000 mL via INTRAVENOUS

## 2019-04-23 MED ORDER — POTASSIUM CHLORIDE CRYS ER 20 MEQ PO TBCR
20.0000 meq | EXTENDED_RELEASE_TABLET | Freq: Once | ORAL | Status: AC
  Administered 2019-04-23: 20 meq via ORAL
  Filled 2019-04-23: qty 1

## 2019-04-23 MED ORDER — MORPHINE SULFATE (PF) 4 MG/ML IV SOLN
4.0000 mg | Freq: Once | INTRAVENOUS | Status: AC
  Administered 2019-04-23: 4 mg via INTRAVENOUS
  Filled 2019-04-23: qty 1

## 2019-04-23 MED ORDER — IOHEXOL 350 MG/ML SOLN
75.0000 mL | Freq: Once | INTRAVENOUS | Status: AC | PRN
  Administered 2019-04-23: 75 mL via INTRAVENOUS

## 2019-04-23 MED ORDER — METHOCARBAMOL 500 MG PO TABS: 500.0000 mg | ORAL_TABLET | Freq: Two times a day (BID) | ORAL | 0 refills | Status: DC

## 2019-04-23 MED ORDER — ONDANSETRON HCL 4 MG/2ML IJ SOLN
4.0000 mg | Freq: Once | INTRAMUSCULAR | Status: AC
  Administered 2019-04-23: 4 mg via INTRAVENOUS
  Filled 2019-04-23: qty 2

## 2019-04-23 MED ORDER — POTASSIUM CHLORIDE 10 MEQ/100ML IV SOLN
10.0000 meq | Freq: Once | INTRAVENOUS | Status: AC
  Administered 2019-04-23: 10 meq via INTRAVENOUS
  Filled 2019-04-23: qty 100

## 2019-04-23 NOTE — ED Provider Notes (Signed)
[name] EMERGENCY DEPARTMENT Provider Note   CSN: [id number] Arrival date & time: 04/23/19  1538    History   Chief Complaint Chief Complaint  Patient presents with   Chest Pain    HPI [name] is a 53 y.o. male hyperlipidemia, hypertension, thoracic aortic aneurysm, hypercoagulable state who presents for evaluation of several days of shortness of breath, chest tightness, nausea, diarrhea, left lower foot numbness.  He states that the symptoms have been ongoing for the last several days but states he came in today because he felt the shortness of breath and chest tightness was getting worse.  He states that he feels that the pain in his chest has moved different places but is currently on his left side of his chest.  He describes it as a tightness sensation.  He states it is worse with deep inspiration but not worse with exertion.  He has not had any previous trauma, injury.  Patient states he is also had some cough that is productive of phlegm.  No hemoptysis.  He states he feels like the shortness of breath is worse when he is sitting down.  He states that when he exerts himself, he does not feel that this makes his shortness of breath worse.  He states he has not noted any fever but has had some chills, generalized weakness, fatigue.  He reports that yesterday, he had some numbness to the plantar surface of his left foot.  He states he did not have any weakness of the left foot.  He states that that numbness has since resolved.  Patient also reports he has had several episodes of nonbloody diarrhea and some generalized abdominal pain.  Patient states that he has not had any recent travel.  He does have history of PE in 2009.  He is unsure what it was because from.  He is not currently on any blood thinners.  Patient denies any swelling in his legs.  He denies any sick contacts or known COVID-19 exposure.  Patient reports a history of high blood pressure.  He denies  any history of diabetes.  He is not a current smoker.  He denies any family history of MIs before the age of 33.    The history is provided by the patient.    Past Medical History:  Diagnosis Date   ABDOMINAL PAIN-EPIGASTRIC    COAGULOPATHY, COUMADIN-INDUCED    DYSPHAGIA OROPHARYNGEAL PHASE    Heartburn    HYPERLIPIDEMIA    HYPERTENSION    HYPERTENSION NEC    HYPOTHYROIDISM    PE    Primary hypercoagulable state (HCC)    Thoracic aneurysm without mention of rupture    Thoracic aortic aneurysm [facility])    VASCULITIS     Patient Active Problem List   Diagnosis Date Noted   Abnormal nuclear stress test    Chest pain 12/05/2016   Bicuspid aortic valve 10/17/2013   PE 06/16/2010   Hemorrhagic disorder due to intrinsic circulating anticoagulants (HCC) 04/07/2010   Primary hypercoagulable state (HCC) 04/07/2010   CHEST PAIN UNSPECIFIED 04/07/2010   VASCULITIS 01/30/2010   Nonspecific (abnormal) findings on radiological and other examination of body structure 01/15/2010   CT, CHEST, ABNORMAL 01/15/2010   Aneurysm of thoracic aorta (HCC) 01/14/2010   Heartburn 01/14/2010   DYSPHAGIA 01/14/2010   ABDOMINAL PAIN-EPIGASTRIC 01/14/2010   Hypothyroidism 01/06/2010   Hyperlipidemia 01/06/2010   Essential hypertension 01/06/2010   LOSS OF WEIGHT 01/06/2010   DYSPHAGIA OROPHARYNGEAL PHASE 01/06/2010  DYSPHAGIA UNSPECIFIED 12/31/2009   HYPERTENSION NEC 12/31/2009    Past Surgical History:  Procedure Laterality Date   CARDIAC CATHETERIZATION N/A 12/08/2016   Procedure: Left Heart Cath and Coronary Angiography;  Surgeon: Lennette Biharihomas A Kelly, MD;  [facility];  Service: Cardiovascular;  Laterality: N/A;   ELBOW SURGERY Left 2010        Home Medications    Prior to Admission medications   Medication Sig Start Date End Date Taking? Authorizing Provider  amLODipine (NORVASC) 10 MG tablet TAKE 1 TABLET BY MOUTH EVERY DAY 02/15/19    Corky CraftsVaranasi, Jayadeep S, MD  aspirin 81 MG tablet Take 81 mg by mouth daily.    [provider]  clindamycin (CLEOCIN T) 1 % lotion Apply topically 2 (two) times daily.  01/02/19   [provider]  levothyroxine (SYNTHROID, LEVOTHROID) 175 MCG tablet Take 175 mcg by mouth daily before breakfast.    [provider]  metoprolol succinate (TOPROL-XL) 50 MG 24 hr tablet Take 1 tablet (50 mg total) by mouth daily. 04/20/17   Corky CraftsVaranasi, Jayadeep S, MD  sertraline (ZOLOFT) 50 MG tablet Take 50 mg by mouth daily.    [provider]  triamcinolone cream (KENALOG) 0.1 % Apply 1 application topically 2 (two) times daily.  01/02/19   [provider]  valsartan (DIOVAN) 320 MG tablet Take 1 tablet (320 mg total) by mouth daily. 07/13/18   Corky CraftsVaranasi, Jayadeep S, MD    Family History Family History  Problem Relation Age of Onset   Heart disease Mother    Hypertension Father     Social History Social History   Tobacco Use   Smoking status: Never Smoker   Smokeless tobacco: Never Used  Substance Use Topics   Alcohol use: No   Drug use: No     Allergies   Latex   Review of Systems Review of Systems  Constitutional: Positive for chills and fatigue. Negative for fever.  Respiratory: Positive for cough and shortness of breath.   Cardiovascular: Positive for chest pain.  Gastrointestinal: Positive for abdominal pain, diarrhea and nausea. Negative for vomiting.  Genitourinary: Negative for dysuria and hematuria.  Neurological: Positive for weakness (generalized) and numbness (resolved). Negative for headaches.  All other systems reviewed and are negative.    Physical Exam Updated Vital Signs BP (!) 148/87    Pulse 61    Temp 98.4 F (36.9 C) (Oral)    Resp 11    SpO2 99%   Physical Exam Vitals signs and nursing note reviewed.  Constitutional:      Appearance: Normal appearance. He is well-developed.  HENT:     Head: Normocephalic and  atraumatic.  Eyes:     General: Lids are normal.     Conjunctiva/sclera: Conjunctivae normal.     Pupils: Pupils are equal, round, and reactive to light.     Comments: EOMs intact. No nystagmus.   Neck:     Musculoskeletal: Full passive range of motion without pain.  Cardiovascular:     Rate and Rhythm: Normal rate and regular rhythm.     Pulses: Normal pulses.          Radial pulses are 2+ on the right side and 2+ on the left side.       Dorsalis pedis pulses are 2+ on the right side and 2+ on the left side.     Heart sounds: Normal heart sounds. No murmur. No friction rub. No gallop.   Pulmonary:  Effort: Pulmonary effort is normal.     Breath sounds: Normal breath sounds.     Comments: Lungs clear to auscultation bilaterally.  Symmetric chest rise.  No wheezing, rales, rhonchi. Chest:     Comments: Pain reproduced with palpation of the midsternal chest area. Abdominal:     Palpations: Abdomen is soft. Abdomen is not rigid.     Tenderness: There is no abdominal tenderness. There is no guarding.     Comments: Abdomen is soft, non-distended, non-tender. No rigidity, No guarding. No peritoneal signs.  Musculoskeletal: Normal range of motion.  Skin:    General: Skin is warm and dry.     Capillary Refill: Capillary refill takes less than 2 seconds.     Comments: Good distal cap refill.  LLE is not dusky in appearance or cool to touch.  Neurological:     Mental Status: He is alert and oriented to person, place, and time.     Comments: Cranial nerves III-XII intact Follows commands, Moves all extremities  5/5 strength to BUE and BLE  Sensation intact throughout all major nerve distributions Normal coordination No slurred speech. No facial droop.   Psychiatric:        Speech: Speech normal.      ED Treatments / Results  Labs (all labs ordered are listed, but only abnormal results are displayed) Labs Reviewed  COMPREHENSIVE METABOLIC PANEL - Abnormal; Notable for the  following components:      Result Value   Potassium 2.8 (*)    Creatinine, Ser 1.27 (*)    Total Bilirubin 1.4 (*)    All other components within normal limits  URINALYSIS, ROUTINE W REFLEX MICROSCOPIC - Abnormal; Notable for the following components:   APPearance HAZY (*)    Specific Gravity, Urine 1.033 (*)    Hgb urine dipstick MODERATE (*)    Bilirubin Urine SMALL (*)    Ketones, ur 20 (*)    Protein, ur 30 (*)    All other components within normal limits  CBC WITH DIFFERENTIAL/PLATELET  TROPONIN I  TROPONIN I    EKG EKG Interpretation  Date/Time:  Sunday Apr 23 2019 15:41:42 EDT Ventricular Rate:  61 PR Interval:    QRS Duration: 101 QT Interval:  393 QTC Calculation: 396 R Axis:   0 Text Interpretation:  Sinus rhythm Artifact no acute ST/T changes Confirmed by Pricilla Loveless [phone]) on 04/23/2019 5:39:27 PM   Radiology Ct Angio Chest Pe W And/or Wo Contrast  Result Date: 04/23/2019 CLINICAL DATA:  Shortness of breath, complex chest pain, symptoms for 2 days; history hypertension, primary hypercoagulable state, thoracic aortic aneurysm EXAM: CT ANGIOGRAPHY CHEST WITH CONTRAST TECHNIQUE: Multidetector CT imaging of the chest was performed using the standard protocol during bolus administration of intravenous contrast. Multiplanar CT image reconstructions and MIPs were obtained to evaluate the vascular anatomy. CONTRAST:  75mL OMNIPAQUE IOHEXOL 350 MG/ML SOLN IV COMPARISON:  02/03/2019 FINDINGS: Cardiovascular: Minimal atherosclerotic calcification aorta. Aneurysmal dilatation of the ascending thoracic aorta 4.5 cm transverse image 51, unchanged. No para-aortic hemorrhage. Pulmonary arteries well opacified and patent. No evidence of pulmonary embolism. No pericardial effusion. Mediastinum/Nodes: Esophagus unremarkable. Base of cervical region normal appearance. No thoracic adenopathy. Lungs/Pleura: Lungs clear. Stable lateral RIGHT lower lobe nodule 4 mm diameter image 98. No  infiltrate, pleural effusion or pneumothorax. Upper Abdomen: Small cysts within liver. Remaining visualized upper abdomen unremarkable Musculoskeletal: Normal appearance Review of the MIP images confirms the above findings. IMPRESSION: No evidence of pulmonary embolism. Stable RIGHT lower  lobe nodule. Stable aneurysmal dilatation ascending thoracic aorta 4.5 cm transverse, recommendation below. Ascending thoracic aortic aneurysm. Recommend semi-annual imaging followup by CTA or MRA and referral to cardiothoracic surgery if not already obtained. This recommendation follows 2010 ACCF/AHA/AATS/ACR/ASA/SCA/SCAI/SIR/STS/SVM Guidelines for the Diagnosis and Management of Patients With Thoracic Aortic Disease. Circulation. 2010; 121: F621-H086. Aortic aneurysm NOS (ICD10-I71.9) Aortic Atherosclerosis (ICD10-I70.0). Aortic aneurysm NOS (ICD10-I71.9). Electronically Signed   By: Ulyses Southward M.D.   On: 04/23/2019 17:47   Dg Chest Portable 1 View  Result Date: 04/23/2019 CLINICAL DATA:  Chest pain EXAM: PORTABLE CHEST 1 VIEW COMPARISON:  December 27, 2016 FINDINGS: The heart size and mediastinal contours are within normal limits. Both lungs are clear. The visualized skeletal structures are unremarkable. IMPRESSION: No active disease. Electronically Signed   By: Gerome Sam III M.D   On: 04/23/2019 16:39    Procedures Procedures (including critical care time)  Medications Ordered in ED Medications  ondansetron [facility]) injection 4 mg (4 mg Intravenous Given 04/23/19 1629)  morphine 4 MG/ML injection 4 mg (4 mg Intravenous Given 04/23/19 1629)  iohexol (OMNIPAQUE) 350 MG/ML injection 75 mL (75 mLs Intravenous Contrast Given 04/23/19 1724)  potassium chloride 10 mEq in 100 mL IVPB (0 mEq Intravenous Stopped 04/23/19 1934)  sodium chloride 0.9 % bolus 1,000 mL (0 mLs Intravenous Stopped 04/23/19 2109)  potassium chloride SA (K-DUR) CR tablet 20 mEq (20 mEq Oral Given 04/23/19 1829)     Initial Impression / Assessment and  Plan / ED Course  I have reviewed the triage vital signs and the nursing notes.  Pertinent labs & imaging results that were available during my care of the patient were reviewed by me and considered in my medical decision making (see chart for details).        53 year old male who presents for evaluation of several days of chest pain, difficulty breathing, cough, diarrhea, nausea.  Reports he came in today because he felt like symptoms were worsening.  Shortness of breath is worse with sitting still.  Not worsened with exertion.  Chest pain is not worse with exertion.  He does note he has been coughing. Patient is afebrile, non-toxic appearing, sitting comfortably on examination table.  Vital stable.  Plan to check labs, chest x-ray.  At this time, he exhibits pulse in all 4 extremities.  He states he had a little bit of numbness of plantar surface of his foot but that is completely resolved now.  Normal neuro exam.  At this time, exam is not concerning for aortic dissection, CVA.  Consider infectious etiology versus ACS etiology versus PE.  Trop negative.  CMP shows potassium of 2.8.  IV potassium given.  Creatinine is 1.27.  UA negative for any infectious etiology.  CBC without any significant abnormality.  Chest x-ray negative for any acute infectious etiology.  Given patient's history of PE as well as some pleuritic chest pain as well as shortness of breath, will plan to proceed with CTA of chest for evaluation.  CTA of chest reviewed.  No evidence of PE.  There is a stable right lower lobe nodule.  There is stable dilatation of the aneurysm of the ascending thoracic aorta measured at 4.5 cm.  HEART score of of 2.  At this time, his symptoms sound atypical of ACS etiology but will plan to check delta troponin.  Troponin negative.  Discussed results with patient.  He states his pain is improved.  He states that the difficulty breathing has improved significantly.  Repeat lung exam shows no  evidence of wheezing, respiratory distress.  Abdominal exam benign with no evidence of tenderness.  Vitals are stable.  O2 sats remained greater than 94% on room air without any difficulty.  Suspect his pain may be due to musculoskeletal etiology given tenderness on palpation.  Additionally, he has been coughing so question costochondritis.  At this time, patient meets no criteria for COVID-19 testing.  I did discuss with patient regarding supportive care measures. At this time, patient exhibits no emergent life-threatening condition that require further evaluation in ED or admission. Strict return precautions discussed. Patient expresses understanding and agreement to plan.   CORDELLE DAHMEN was evaluated in Emergency Department on 04/23/2019 for the symptoms described in the history of present illness. He was evaluated in the context of the global COVID-19 pandemic, which necessitated consideration that the patient might be at risk for infection with the SARS-CoV-2 virus that causes COVID-19. Institutional protocols and algorithms that pertain to the evaluation of patients at risk for COVID-19 are in a state of rapid change based on information released by regulatory bodies including the CDC and federal and state organizations. These policies and algorithms were followed during the patient's care in the ED.  Portions of this note were generated with Scientist, clinical (histocompatibility and immunogenetics). Dictation errors may occur despite best attempts at proofreading.   Final Clinical Impressions(s) / ED Diagnoses   Final diagnoses:  Atypical chest pain  Hypokalemia    ED Discharge Orders    None       Rosana Hoes 04/24/19 2207    Pricilla Loveless, MD 04/27/19 1246

## 2019-04-23 NOTE — ED Notes (Signed)
Pt aware UA needed. 

## 2019-04-23 NOTE — ED Triage Notes (Signed)
Pt arrives via EMS from home with L sided CP onset today as well as productive cough, sore throat, body aches, chills. Pt also complain of NVD X1 week. Pt did take 324mg  ASA prior to EMS arrival. Hx aortic aneurism. 136/78, HR 65 NSR, 98% RA, CBG 107. Pt in NAD.

## 2019-04-23 NOTE — Discharge Instructions (Signed)
You can take Tylenol or Ibuprofen as directed for pain. You can alternate Tylenol and Ibuprofen every 4 hours. If you take Tylenol at 1pm, then you can take Ibuprofen at 5pm. Then you can take Tylenol again at 9pm.   Take Robaxin as prescribed. This medication will make you drowsy so do not drive or drink alcohol when taking it.  Follow-up with your primary care doctor.  Return emergency department for any worsening chest pain, difficulty breathing, numbness/weakness of your arms or legs, fever, vomiting, abdominal pain or any other worsening or concerning symptoms.

## 2019-04-24 ENCOUNTER — Emergency Department (HOSPITAL_COMMUNITY)
Admission: EM | Admit: 2019-04-24 | Discharge: 2019-04-24 | Disposition: A | Discharge status: Home / Self Care | Payer: BLUE CROSS/BLUE SHIELD | Attending: Emergency Medicine | Admitting: Emergency Medicine

## 2019-04-24 ENCOUNTER — Telehealth: Payer: Self-pay | Admitting: Student

## 2019-04-24 ENCOUNTER — Encounter (HOSPITAL_COMMUNITY): Payer: Self-pay | Admitting: Emergency Medicine

## 2019-04-24 DIAGNOSIS — Z7982 Long term (current) use of aspirin: Secondary | ICD-10-CM | POA: Insufficient documentation

## 2019-04-24 DIAGNOSIS — R079 Chest pain, unspecified: Secondary | ICD-10-CM | POA: Insufficient documentation

## 2019-04-24 DIAGNOSIS — E039 Hypothyroidism, unspecified: Secondary | ICD-10-CM | POA: Diagnosis not present

## 2019-04-24 DIAGNOSIS — R450 Nervousness: Secondary | ICD-10-CM | POA: Diagnosis not present

## 2019-04-24 DIAGNOSIS — R197 Diarrhea, unspecified: Secondary | ICD-10-CM | POA: Diagnosis not present

## 2019-04-24 DIAGNOSIS — R202 Paresthesia of skin: Secondary | ICD-10-CM | POA: Diagnosis not present

## 2019-04-24 DIAGNOSIS — R070 Pain in throat: Secondary | ICD-10-CM | POA: Diagnosis not present

## 2019-04-24 DIAGNOSIS — R110 Nausea: Secondary | ICD-10-CM | POA: Diagnosis not present

## 2019-04-24 DIAGNOSIS — E876 Hypokalemia: Secondary | ICD-10-CM | POA: Diagnosis not present

## 2019-04-24 DIAGNOSIS — I10 Essential (primary) hypertension: Secondary | ICD-10-CM | POA: Diagnosis not present

## 2019-04-24 DIAGNOSIS — Z79899 Other long term (current) drug therapy: Secondary | ICD-10-CM | POA: Diagnosis not present

## 2019-04-24 DIAGNOSIS — Z209 Contact with and (suspected) exposure to unspecified communicable disease: Secondary | ICD-10-CM | POA: Diagnosis not present

## 2019-04-24 DIAGNOSIS — Z91040 Latex allergy status: Secondary | ICD-10-CM | POA: Diagnosis not present

## 2019-04-24 DIAGNOSIS — R0789 Other chest pain: Secondary | ICD-10-CM | POA: Diagnosis not present

## 2019-04-24 LAB — BASIC METABOLIC PANEL
Anion gap: 12 (ref 5–15)
BUN: 13 mg/dL (ref 6–20)
CO2: 22 mmol/L (ref 22–32)
Calcium: 8.7 mg/dL — ABNORMAL LOW (ref 8.9–10.3)
Chloride: 110 mmol/L (ref 98–111)
Creatinine, Ser: 1.17 mg/dL (ref 0.61–1.24)
GFR calc Af Amer: >60 mL/min (ref >60)
GFR calc non Af Amer: >60 mL/min (ref >60)
Glucose, Bld: 91 mg/dL (ref 70–99)
Potassium: 2.9 mmol/L — ABNORMAL LOW (ref 3.5–5.1)
Sodium: 144 mmol/L (ref 135–145)

## 2019-04-24 LAB — TROPONIN I: Troponin I: <0.03 ng/mL (ref <0.03)

## 2019-04-24 MED ORDER — POTASSIUM CHLORIDE 10 MEQ/100ML IV SOLN
10.0000 meq | Freq: Once | INTRAVENOUS | Status: AC
  Administered 2019-04-24: 10 meq via INTRAVENOUS
  Filled 2019-04-24: qty 100

## 2019-04-24 MED ORDER — KETOROLAC TROMETHAMINE 30 MG/ML IJ SOLN
30.0000 mg | Freq: Once | INTRAMUSCULAR | Status: AC
  Administered 2019-04-24: 04:00:00 30 mg via INTRAVENOUS
  Filled 2019-04-24: qty 1

## 2019-04-24 MED ORDER — POTASSIUM CHLORIDE CRYS ER 20 MEQ PO TBCR: 20.0000 meq | EXTENDED_RELEASE_TABLET | Freq: Two times a day (BID) | ORAL | 0 refills | Status: DC

## 2019-04-24 MED ORDER — POTASSIUM CHLORIDE CRYS ER 20 MEQ PO TBCR
40.0000 meq | EXTENDED_RELEASE_TABLET | Freq: Once | ORAL | Status: AC
  Administered 2019-04-24: 40 meq via ORAL
  Filled 2019-04-24: qty 2

## 2019-04-24 NOTE — ED Triage Notes (Signed)
Patient seen  Earlier today for the same. Discharged around 10pm.  Reports pain no better than when he left and unable to get robaxin prescription due to pharmacy being closed.

## 2019-04-24 NOTE — Discharge Instructions (Addendum)
Apply ice to the sore area as needed.  Take ibuprofen or acetaminophen as needed.

## 2019-04-24 NOTE — ED Provider Notes (Signed)
[facility] Provider Note   CSN: [id number] Arrival date & time: 04/24/19  0119    History   Chief Complaint Chief Complaint  Patient presents with   Chest Pain    HPI [name] is a 53 y.o. male.   The history is provided by the patient.  Chest Pain  He has history of hypertension, hyperlipidemia, thoracic aortic aneurysm, hypercoagulable state with prior pulmonary embolism who comes in with ongoing chest pain.  Pain started 2 days ago and was described as a pressure feeling across the lower sternal area, and now is a sharp pain in the left lower chest in the midclavicular line.  Pain is worse when he takes a breath.  He rates pain at 8/10.  There is mild associated dyspnea but no nausea or diaphoresis.  He has had a slight cough which is nonproductive.  He denies fever or chills.  He had been seen in the emergency department earlier and had work-up which included a CT angiogram of the chest which was unremarkable.  He has not taken anything for pain.  He is a non-smoker and there is no family history of premature coronary atherosclerosis.  There is no history of diabetes.  Past Medical History:  Diagnosis Date   ABDOMINAL PAIN-EPIGASTRIC    COAGULOPATHY, COUMADIN-INDUCED    DYSPHAGIA OROPHARYNGEAL PHASE    Heartburn    HYPERLIPIDEMIA    HYPERTENSION    HYPERTENSION NEC    HYPOTHYROIDISM    PE    Primary hypercoagulable state (HCC)    Thoracic aneurysm without mention of rupture    Thoracic aortic aneurysm [facility](HCC)    VASCULITIS     Patient Active Problem List   Diagnosis Date Noted   Abnormal nuclear stress test    Chest pain 12/05/2016   Bicuspid aortic valve 10/17/2013   PE 06/16/2010   Hemorrhagic disorder due to intrinsic circulating anticoagulants (HCC) 04/07/2010   Primary hypercoagulable state (HCC) 04/07/2010   CHEST PAIN UNSPECIFIED 04/07/2010   VASCULITIS 01/30/2010   Nonspecific (abnormal)  findings on radiological and other examination of body structure 01/15/2010   CT, CHEST, ABNORMAL 01/15/2010   Aneurysm of thoracic aorta (HCC) 01/14/2010   Heartburn 01/14/2010   DYSPHAGIA 01/14/2010   ABDOMINAL PAIN-EPIGASTRIC 01/14/2010   Hypothyroidism 01/06/2010   Hyperlipidemia 01/06/2010   Essential hypertension 01/06/2010   LOSS OF WEIGHT 01/06/2010   DYSPHAGIA OROPHARYNGEAL PHASE 01/06/2010   DYSPHAGIA UNSPECIFIED 12/31/2009   HYPERTENSION NEC 12/31/2009    Past Surgical History:  Procedure Laterality Date   CARDIAC CATHETERIZATION N/A 12/08/2016   Procedure: Left Heart Cath and Coronary Angiography;  Surgeon: Lennette Biharihomas A Kelly, MD;  Location: [facility] INVASIVE CV LAB;  Service: Cardiovascular;  Laterality: N/A;   ELBOW SURGERY Left 2010        Home Medications    Prior to Admission medications   Medication Sig Start Date End Date Taking? Authorizing Provider  amLODipine (NORVASC) 10 MG tablet TAKE 1 TABLET BY MOUTH EVERY DAY 02/15/19   Corky CraftsVaranasi, Jayadeep S, MD  aspirin 81 MG tablet Take 81 mg by mouth daily.    [provider]  clindamycin (CLEOCIN T) 1 % lotion Apply topically 2 (two) times daily.  01/02/19   [provider]  levothyroxine (SYNTHROID, LEVOTHROID) 175 MCG tablet Take 175 mcg by mouth daily before breakfast.    [provider]  methocarbamol (ROBAXIN) 500 MG tablet Take 1 tablet (500 mg total) by mouth 2 (two) times daily. 04/23/19  Graciella Freer A, PA-C  metoprolol succinate (TOPROL-XL) 50 MG 24 hr tablet Take 1 tablet (50 mg total) by mouth daily. 04/20/17   Corky Crafts, MD  sertraline (ZOLOFT) 50 MG tablet Take 50 mg by mouth daily.    [provider]  triamcinolone cream (KENALOG) 0.1 % Apply 1 application topically 2 (two) times daily.  01/02/19   [provider]  valsartan (DIOVAN) 320 MG tablet Take 1 tablet (320 mg total) by mouth daily. 07/13/18   Corky Crafts, MD    Family  History Family History  Problem Relation Age of Onset   Heart disease Mother    Hypertension Father     Social History Social History   Tobacco Use   Smoking status: Never Smoker   Smokeless tobacco: Never Used  Substance Use Topics   Alcohol use: No   Drug use: No     Allergies   Latex   Review of Systems Review of Systems  Cardiovascular: Positive for chest pain.  All other systems reviewed and are negative.    Physical Exam Updated Vital Signs BP (!) 150/88    Pulse 64    Temp 98.5 F (36.9 C) (Oral)    Resp 15    Ht  (1.905 m)    Wt 102.5 kg    SpO2 97%    BMI 28.24 kg/m   Physical Exam Vitals signs and nursing note reviewed.    53 year old male, resting comfortably and in no acute distress. Vital signs are significant for elevated systolic blood pressure. Oxygen saturation is 97%, which is normal. Head is normocephalic and atraumatic. PERRLA, EOMI. Oropharynx is clear. Neck is nontender and supple without adenopathy or JVD. Back is nontender and there is no CVA tenderness. Lungs are clear without rales, wheezes, or rhonchi. Chest is mildly tender in the left lower rib cage in the mid clavicular line. Heart has regular rate and rhythm without murmur. Abdomen is soft, flat, nontender without masses or hepatosplenomegaly and peristalsis is normoactive. Extremities have no cyanosis or edema, full range of motion is present. Skin is warm and dry without rash. Neurologic: Mental status is normal, cranial nerves are intact, there are no motor or sensory deficits.  ED Treatments / Results  Labs (all labs ordered are listed, but only abnormal results are displayed) Labs Reviewed  BASIC METABOLIC PANEL - Abnormal; Notable for the following components:      Result Value   Potassium 2.9 (*)    Calcium 8.7 (*)    All other components within normal limits  TROPONIN I    EKG EKG Interpretation  Date/Time:  Monday Apr 24 2019 01:23:36  EDT Ventricular Rate:  54 PR Interval:    QRS Duration: 107 QT Interval:  412 QTC Calculation: 391 R Axis:   1 Text Interpretation:  Sinus rhythm Borderline prolonged PR interval Otherwise within normal limits When compared with ECG of 04/23/2019, No significant change was found Confirmed by Dione Booze ([id number]) on 04/24/2019 1:27:37 AM   Radiology Ct Angio Chest Pe W And/or Wo Contrast  Result Date: 04/23/2019 CLINICAL DATA:  Shortness of breath, complex chest pain, symptoms for 2 days; history hypertension, primary hypercoagulable state, thoracic aortic aneurysm EXAM: CT ANGIOGRAPHY CHEST WITH CONTRAST TECHNIQUE: Multidetector CT imaging of the chest was performed using the standard protocol during bolus administration of intravenous contrast. Multiplanar CT image reconstructions and MIPs were obtained to evaluate the vascular anatomy. CONTRAST:  75mL OMNIPAQUE IOHEXOL 350 MG/ML SOLN  IV COMPARISON:  02/03/2019 FINDINGS: Cardiovascular: Minimal atherosclerotic calcification aorta. Aneurysmal dilatation of the ascending thoracic aorta 4.5 cm transverse image 51, unchanged. No para-aortic hemorrhage. Pulmonary arteries well opacified and patent. No evidence of pulmonary embolism. No pericardial effusion. Mediastinum/Nodes: Esophagus unremarkable. Base of cervical region normal appearance. No thoracic adenopathy. Lungs/Pleura: Lungs clear. Stable lateral RIGHT lower lobe nodule 4 mm diameter image 98. No infiltrate, pleural effusion or pneumothorax. Upper Abdomen: Small cysts within liver. Remaining visualized upper abdomen unremarkable Musculoskeletal: Normal appearance Review of the MIP images confirms the above findings. IMPRESSION: No evidence of pulmonary embolism. Stable RIGHT lower lobe nodule. Stable aneurysmal dilatation ascending thoracic aorta 4.5 cm transverse, recommendation below. Ascending thoracic aortic aneurysm. Recommend semi-annual imaging followup by CTA or MRA and referral to  cardiothoracic surgery if not already obtained. This recommendation follows 2010 ACCF/AHA/AATS/ACR/ASA/SCA/SCAI/SIR/STS/SVM Guidelines for the Diagnosis and Management of Patients With Thoracic Aortic Disease. Circulation. 2010; 121: V400-Q676. Aortic aneurysm NOS (ICD10-I71.9) Aortic Atherosclerosis (ICD10-I70.0). Aortic aneurysm NOS (ICD10-I71.9). Electronically Signed   By: Ulyses Southward M.D.   On: 04/23/2019 17:47   Dg Chest Portable 1 View  Result Date: 04/23/2019 CLINICAL DATA:  Chest pain EXAM: PORTABLE CHEST 1 VIEW COMPARISON:  December 27, 2016 FINDINGS: The heart size and mediastinal contours are within normal limits. Both lungs are clear. The visualized skeletal structures are unremarkable. IMPRESSION: No active disease. Electronically Signed   By: Gerome Sam III M.D   On: 04/23/2019 16:39    Procedures Procedures   Medications Ordered in ED Medications  ketorolac (TORADOL) 30 MG/ML injection 30 mg (30 mg Intravenous Given 04/24/19 0358)  potassium chloride 10 mEq in 100 mL IVPB (0 mEq Intravenous Stopped 04/24/19 0512)  potassium chloride SA (K-DUR) CR tablet 40 mEq (40 mEq Oral Given 04/24/19 0359)     Initial Impression / Assessment and Plan / ED Course  I have reviewed the triage vital signs and the nursing notes.  Pertinent labs & imaging results that were available during my care of the patient were reviewed by me and considered in my medical decision making (see chart for details).  Ongoing chest pain and patient who was seen in the ED earlier today with negative evaluation.  CT angiogram was reviewed and showed no pulmonary emboli, stable thoracic aortic aneurysm.  ECG is unchanged.  On further review of past records, he had a cardiac catheterization in December 2017 which showed normal coronary arteries.  His work-up thus far has eliminated all severe causes of chest pain.  Will repeat troponin.  It was noted that he was severely hypokalemic and only received a modest amount of  potassium at his ED visit.  Will recheck electrolytes.  In the meantime, he will be given a trial of ketorolac for pain control.  Potassium has come back still very low at 2.9.  He is given intravenous and oral potassium.  Pain did improve with ketorolac.  He is discharged with prescription for K. Dur, follow-up with PCP.  Final Clinical Impressions(s) / ED Diagnoses   Final diagnoses:  Nonspecific chest pain  Hypokalemia    ED Discharge Orders         Ordered    potassium chloride SA (K-DUR) 20 MEQ tablet  2 times daily     04/24/19 0612           Dione Booze, MD 04/24/19 [phone]

## 2019-04-24 NOTE — Telephone Encounter (Signed)
   Received page from answering service that patient is having chest pain. Patient reports chest pain for several days as well as diarrhea and cough for 1 week. Patient has been seen in the ED twice since yesterday and is currently in the ED waiting room after being discharged. Patient reports he is still having 5/10 chest pain. Per ED note, pain worse with deep inspiration and chest wall tenderness on exam. EKG showed no acute ischemic changes. Troponin negative x3. Chest CTA showed no evidence of pulmonary embolism and stable aortic aneurysm. Patient had normal coronaries on cardiac catheterization in 11/2016. Patient was noted to be severely hypokalemic with potassium of 2.8 and 2.9 in the ED. Patient was prescribed K-Dur and a Robaxin on discharge from the ED and was advised to follow-up with his PCP. Patient states he called the on-call physician at his PCP's office who advised him to call his Cardiologist. I went over all the results of the test in the ED with patient and reassured him that it does not sound like his chest pain is coming from his heart. Informed patient that I would pass message along to Dr. Eldridge Dace so that he is aware but advised him to follow-up with his PCP. Encouraged patient to call back with any new or worsening symptoms.  Corrin Parker, PA-C 04/24/2019 8:02 AM

## 2019-04-25 ENCOUNTER — Emergency Department (HOSPITAL_COMMUNITY): Payer: BLUE CROSS/BLUE SHIELD

## 2019-04-25 ENCOUNTER — Encounter (HOSPITAL_COMMUNITY): Payer: Self-pay | Admitting: Emergency Medicine

## 2019-04-25 ENCOUNTER — Emergency Department (HOSPITAL_COMMUNITY)
Admission: EM | Admit: 2019-04-25 | Discharge: 2019-04-25 | Disposition: A | Discharge status: Home / Self Care | Payer: BLUE CROSS/BLUE SHIELD | Attending: Emergency Medicine | Admitting: Emergency Medicine

## 2019-04-25 DIAGNOSIS — R200 Anesthesia of skin: Secondary | ICD-10-CM | POA: Diagnosis not present

## 2019-04-25 DIAGNOSIS — E876 Hypokalemia: Secondary | ICD-10-CM | POA: Insufficient documentation

## 2019-04-25 DIAGNOSIS — Z79899 Other long term (current) drug therapy: Secondary | ICD-10-CM | POA: Diagnosis not present

## 2019-04-25 DIAGNOSIS — Z7982 Long term (current) use of aspirin: Secondary | ICD-10-CM | POA: Insufficient documentation

## 2019-04-25 DIAGNOSIS — Z86718 Personal history of other venous thrombosis and embolism: Secondary | ICD-10-CM | POA: Diagnosis not present

## 2019-04-25 DIAGNOSIS — R531 Weakness: Secondary | ICD-10-CM | POA: Insufficient documentation

## 2019-04-25 DIAGNOSIS — I639 Cerebral infarction, unspecified: Secondary | ICD-10-CM | POA: Diagnosis not present

## 2019-04-25 DIAGNOSIS — R202 Paresthesia of skin: Secondary | ICD-10-CM | POA: Diagnosis not present

## 2019-04-25 DIAGNOSIS — E039 Hypothyroidism, unspecified: Secondary | ICD-10-CM | POA: Diagnosis not present

## 2019-04-25 DIAGNOSIS — R29810 Facial weakness: Secondary | ICD-10-CM | POA: Diagnosis not present

## 2019-04-25 DIAGNOSIS — I10 Essential (primary) hypertension: Secondary | ICD-10-CM | POA: Diagnosis not present

## 2019-04-25 LAB — URINALYSIS, ROUTINE W REFLEX MICROSCOPIC
Bacteria, UA: NONE SEEN
Bilirubin Urine: NEGATIVE
Glucose, UA: NEGATIVE mg/dL
Ketones, ur: 5 mg/dL — AB
Leukocytes,Ua: NEGATIVE
Nitrite: NEGATIVE
Protein, ur: NEGATIVE mg/dL
Specific Gravity, Urine: 1.013 (ref 1.005–1.030)
pH: 6 (ref 5.0–8.0)

## 2019-04-25 LAB — DIFFERENTIAL
Abs Immature Granulocytes: 0.03 10*3/uL (ref 0.00–0.07)
Basophils Absolute: 0 10*3/uL (ref 0.0–0.1)
Basophils Relative: 0 %
Eosinophils Absolute: 0.1 10*3/uL (ref 0.0–0.5)
Eosinophils Relative: 1 %
Immature Granulocytes: 0 %
Lymphocytes Relative: 20 %
Lymphs Abs: 1.9 10*3/uL (ref 0.7–4.0)
Monocytes Absolute: 1.2 10*3/uL — ABNORMAL HIGH (ref 0.1–1.0)
Monocytes Relative: 12 %
Neutro Abs: 6.3 10*3/uL (ref 1.7–7.7)
Neutrophils Relative %: 67 %

## 2019-04-25 LAB — PROTIME-INR
INR: 1.2 (ref 0.8–1.2)
Prothrombin Time: 14.7 seconds (ref 11.4–15.2)

## 2019-04-25 LAB — CBC
HCT: 45.1 % (ref 39.0–52.0)
Hemoglobin: 15.6 g/dL (ref 13.0–17.0)
MCH: 32.6 pg (ref 26.0–34.0)
MCHC: 34.6 g/dL (ref 30.0–36.0)
MCV: 94.4 fL (ref 80.0–100.0)
Platelets: 189 10*3/uL (ref 150–400)
RBC: 4.78 MIL/uL (ref 4.22–5.81)
RDW: 13.2 % (ref 11.5–15.5)
WBC: 9.5 10*3/uL (ref 4.0–10.5)
nRBC: 0 % (ref 0.0–0.2)

## 2019-04-25 LAB — RAPID URINE DRUG SCREEN, HOSP PERFORMED
Amphetamines: NOT DETECTED
Barbiturates: NOT DETECTED
Benzodiazepines: NOT DETECTED
Cocaine: NOT DETECTED
Opiates: NOT DETECTED
Tetrahydrocannabinol: NOT DETECTED

## 2019-04-25 LAB — COMPREHENSIVE METABOLIC PANEL
ALT: 20 U/L (ref 0–44)
AST: 31 U/L (ref 15–41)
Albumin: 3.7 g/dL (ref 3.5–5.0)
Alkaline Phosphatase: 52 U/L (ref 38–126)
Anion gap: 15 (ref 5–15)
BUN: 11 mg/dL (ref 6–20)
CO2: 20 mmol/L — ABNORMAL LOW (ref 22–32)
Calcium: 9.2 mg/dL (ref 8.9–10.3)
Chloride: 108 mmol/L (ref 98–111)
Creatinine, Ser: 1.01 mg/dL (ref 0.61–1.24)
GFR calc Af Amer: >60 mL/min (ref >60)
GFR calc non Af Amer: >60 mL/min (ref >60)
Glucose, Bld: 92 mg/dL (ref 70–99)
Potassium: 3.3 mmol/L — ABNORMAL LOW (ref 3.5–5.1)
Sodium: 143 mmol/L (ref 135–145)
Total Bilirubin: 1.4 mg/dL — ABNORMAL HIGH (ref 0.3–1.2)
Total Protein: 6.7 g/dL (ref 6.5–8.1)

## 2019-04-25 LAB — I-STAT TROPONIN, ED: Troponin i, poc: 0 ng/mL (ref 0.00–0.08)

## 2019-04-25 LAB — I-STAT CREATININE, ED: Creatinine, Ser: 0.9 mg/dL (ref 0.61–1.24)

## 2019-04-25 LAB — APTT: aPTT: 28 seconds (ref 24–36)

## 2019-04-25 LAB — ETHANOL: Alcohol, Ethyl (B): <10 mg/dL (ref <10)

## 2019-04-25 LAB — CBG MONITORING, ED: Glucose-Capillary: 82 mg/dL (ref 70–99)

## 2019-04-25 MED ORDER — IOHEXOL 350 MG/ML SOLN
75.0000 mL | Freq: Once | INTRAVENOUS | Status: AC | PRN
  Administered 2019-04-25: 75 mL via INTRAVENOUS

## 2019-04-25 MED ORDER — LORAZEPAM 2 MG/ML IJ SOLN
INTRAMUSCULAR | Status: AC
  Administered 2019-04-25: 1 mg via INTRAVENOUS
  Filled 2019-04-25: qty 1

## 2019-04-25 MED ORDER — LORAZEPAM 2 MG/ML IJ SOLN
1.0000 mg | Freq: Once | INTRAMUSCULAR | Status: AC
  Administered 2019-04-25: 1 mg via INTRAVENOUS

## 2019-04-25 MED ORDER — POTASSIUM CHLORIDE CRYS ER 20 MEQ PO TBCR
40.0000 meq | EXTENDED_RELEASE_TABLET | Freq: Once | ORAL | Status: AC
  Administered 2019-04-25: 40 meq via ORAL
  Filled 2019-04-25: qty 2

## 2019-04-25 NOTE — ED Provider Notes (Signed)
[facility] EMERGENCY DEPARTMENT Provider Note   CSN: [id number] Arrival date & time: 04/25/19  0001    History   Chief Complaint No chief complaint on file.   HPI [name] is a 53 y.o. male.   The history is provided by the patient.  He has history of hypertension, hyperlipidemia, pulmonary embolism, thoracic aortic aneurysm, vasculitis and comes in because of weakness and numbness.  At about 10 PM, he noted some numbness and weakness of his left arm and left leg.  This is been waxing and waning.  EMS noted right-sided facial droop.  He denies any headache.  Had been seen in the ED yesterday for chest pain, but that is resolved.  He is a non-smoker and denies any drug use.  Past Medical History:  Diagnosis Date   ABDOMINAL PAIN-EPIGASTRIC    COAGULOPATHY, COUMADIN-INDUCED    DYSPHAGIA OROPHARYNGEAL PHASE    Heartburn    HYPERLIPIDEMIA    HYPERTENSION    HYPERTENSION NEC    HYPOTHYROIDISM    PE    Primary hypercoagulable state (HCC)    Thoracic aneurysm without mention of rupture    Thoracic aortic aneurysm [facility])    VASCULITIS     Patient Active Problem List   Diagnosis Date Noted   Abnormal nuclear stress test    Chest pain 12/05/2016   Bicuspid aortic valve 10/17/2013   PE 06/16/2010   Hemorrhagic disorder due to intrinsic circulating anticoagulants (HCC) 04/07/2010   Primary hypercoagulable state (HCC) 04/07/2010   CHEST PAIN UNSPECIFIED 04/07/2010   VASCULITIS 01/30/2010   Nonspecific (abnormal) findings on radiological and other examination of body structure 01/15/2010   CT, CHEST, ABNORMAL 01/15/2010   Aneurysm of thoracic aorta (HCC) 01/14/2010   Heartburn 01/14/2010   DYSPHAGIA 01/14/2010   ABDOMINAL PAIN-EPIGASTRIC 01/14/2010   Hypothyroidism 01/06/2010   Hyperlipidemia 01/06/2010   Essential hypertension 01/06/2010   LOSS OF WEIGHT 01/06/2010   DYSPHAGIA OROPHARYNGEAL PHASE 01/06/2010    DYSPHAGIA UNSPECIFIED 12/31/2009   HYPERTENSION NEC 12/31/2009    Past Surgical History:  Procedure Laterality Date   CARDIAC CATHETERIZATION N/A 12/08/2016   Procedure: Left Heart Cath and Coronary Angiography;  Surgeon: Lennette Bihari, MD;  Location: [facility] INVASIVE CV LAB;  Service: Cardiovascular;  Laterality: N/A;   ELBOW SURGERY Left 2010        Home Medications    Prior to Admission medications   Medication Sig Start Date End Date Taking? Authorizing Provider  amLODipine (NORVASC) 10 MG tablet TAKE 1 TABLET BY MOUTH EVERY DAY Patient taking differently: Take 10 mg by mouth daily.  02/15/19   Corky Crafts, MD  aspirin 81 MG tablet Take 81 mg by mouth daily.    [provider]  levothyroxine (SYNTHROID, LEVOTHROID) 175 MCG tablet Take 175 mcg by mouth daily before breakfast.    [provider]  methocarbamol (ROBAXIN) 500 MG tablet Take 1 tablet (500 mg total) by mouth 2 (two) times daily. 04/23/19   Maxwell Caul, PA-C  metoprolol succinate (TOPROL-XL) 50 MG 24 hr tablet Take 1 tablet (50 mg total) by mouth daily. 04/20/17   Corky Crafts, MD  potassium chloride SA (K-DUR) 20 MEQ tablet Take 1 tablet (20 mEq total) by mouth 2 (two) times daily. 04/24/19   Dione Booze, MD  sertraline (ZOLOFT) 50 MG tablet Take 50 mg by mouth daily.    [provider]  triamcinolone cream (KENALOG) 0.1 % Apply 1 application topically 2 (two) times daily.  01/02/19   [provider]  valsartan (DIOVAN) 320 MG tablet Take 1 tablet (320 mg total) by mouth daily. 07/13/18   Corky Crafts, MD    Family History Family History  Problem Relation Age of Onset   Heart disease Mother    Hypertension Father     Social History Social History   Tobacco Use   Smoking status: Never Smoker   Smokeless tobacco: Never Used  Substance Use Topics   Alcohol use: No   Drug use: No     Allergies   Latex   Review of Systems Review of Systems    All other systems reviewed and are negative.    Physical Exam Updated Vital Signs BP (!) 170/102    Pulse 89    Resp 14    Ht  (1.905 m)    Wt 102.6 kg    SpO2 100%    BMI 28.27 kg/m   Physical Exam Vitals signs and nursing note reviewed.    53 year old male, resting comfortably and in no acute distress. Vital signs are significant for elevated blood pressure. Oxygen saturation is 100%, which is normal. Head is normocephalic and atraumatic. PERRLA, EOMI. Oropharynx is clear. Neck is nontender and supple without adenopathy or JVD. Back is nontender and there is no CVA tenderness. Lungs are clear without rales, wheezes, or rhonchi. Chest is nontender. Heart has regular rate and rhythm without murmur. Abdomen is soft, flat, nontender without masses or hepatosplenomegaly and peristalsis is normoactive. Extremities have no cyanosis or edema, full range of motion is present. Skin is warm and dry without rash. Neurologic: Awake, alert, oriented x4.  Cranial nerves are significant for decreased sensation on the left side of the face, questionable right central facial droop.  Tongue protrudes in the midline.  Shrug is symmetric.  There is slight weakness of the left arm and left leg compared with the right, and there is also slight decrease sensation of the left leg and left arm compared with the right.  There is no extinction on double simultaneous stimulation.  ED Treatments / Results  Labs (all labs ordered are listed, but only abnormal results are displayed) Labs Reviewed  DIFFERENTIAL - Abnormal; Notable for the following components:      Result Value   Monocytes Absolute 1.2 (*)    All other components within normal limits  COMPREHENSIVE METABOLIC PANEL - Abnormal; Notable for the following components:   Potassium 3.3 (*)    CO2 20 (*)    Total Bilirubin 1.4 (*)    All other components within normal limits  URINALYSIS, ROUTINE W REFLEX MICROSCOPIC - Abnormal; Notable for the  following components:   Color, Urine STRAW (*)    Hgb urine dipstick MODERATE (*)    Ketones, ur 5 (*)    All other components within normal limits  SARS CORONAVIRUS 2 (HOSPITAL ORDER, PERFORMED IN [place] [facility])  ETHANOL  PROTIME-INR  APTT  CBC  RAPID URINE DRUG SCREEN, HOSP PERFORMED  I-STAT CREATININE, ED  I-STAT TROPONIN, ED  CBG MONITORING, ED    EKG EKG Interpretation  Date/Time:  Tuesday Apr 25 2019 01:06:46 EDT Ventricular Rate:  68 PR Interval:    QRS Duration: 108 QT Interval:  402 QTC Calculation: 428 R Axis:   39 Text Interpretation:  Sinus rhythm Normal ECG When compared with ECG of 04/24/2019, No significant change was found Confirmed by Dione Booze ([id number]) on 04/25/2019 1:16:53 AM   Radiology Ct Angio  Head W Or Wo Contrast  Addendum Date: 04/25/2019   ADDENDUM REPORT: 04/25/2019 01:14 ADDENDUM: Addition to impression: 4 cm ascending aortic aneurysm. Recommend annual imaging followup by CTA or MRA. This recommendation follows 2010 ACCF/AHA/AATS/ACR/ASA/SCA/SCAI/SIR/STS/SVM Guidelines for the Diagnosis and Management of Patients with Thoracic Aortic Disease. Circulation. 2010; 121: Z610-R604. Aortic aneurysm NOS (ICD10-I71.9) Electronically Signed   By: Mitzi Hansen M.D.   On: 04/25/2019 01:14   Result Date: 04/25/2019 CLINICAL DATA:  53 y/o  M; stroke for follow-up. EXAM: CT ANGIOGRAPHY HEAD AND NECK TECHNIQUE: Multidetector CT imaging of the head and neck was performed using the standard protocol during bolus administration of intravenous contrast. Multiplanar CT image reconstructions and MIPs were obtained to evaluate the vascular anatomy. Carotid stenosis measurements (when applicable) are obtained utilizing NASCET criteria, using the distal internal carotid diameter as the denominator. CONTRAST:  75mL OMNIPAQUE IOHEXOL 350 MG/ML SOLN COMPARISON:  04/25/2019 CT head. 12/28/2009 MRI and MRA head. 12/28/2009 MRA neck. FINDINGS: CTA NECK FINDINGS  Aortic arch: Ascending aortic aneurysm measuring 4.0 cm. Three-vessel arch. No dissection. Right carotid system: No evidence of dissection, stenosis (50% or greater) or occlusion. Left carotid system: No evidence of dissection, stenosis (50% or greater) or occlusion. Vertebral arteries: Codominant. No evidence of dissection, stenosis (50% or greater) or occlusion. Skeleton: Negative. Other neck: Negative. Upper chest: Negative. Review of the MIP images confirms the above findings CTA HEAD FINDINGS Anterior circulation: No significant stenosis, proximal occlusion, aneurysm, or vascular malformation. Posterior circulation: No significant stenosis, proximal occlusion, aneurysm, or vascular malformation. Venous sinuses: As permitted by contrast timing, patent. Anatomic variants: Left dominant A2 and A3. Review of the MIP images confirms the above findings IMPRESSION: 1. Patent carotid and vertebral arteries. No dissection, aneurysm, or hemodynamically significant stenosis utilizing NASCET criteria. 2. Patent anterior and posterior intracranial circulation. No large vessel occlusion, aneurysm, or significant stenosis. These results were called by telephone at the time of interpretation on 04/25/2019 at 12:37 am to Dr. Milon Dikes , who verbally acknowledged these results. Electronically Signed: By: Mitzi Hansen M.D. On: 04/25/2019 00:38   Ct Angio Neck W Or Wo Contrast  Addendum Date: 04/25/2019   ADDENDUM REPORT: 04/25/2019 01:14 ADDENDUM: Addition to impression: 4 cm ascending aortic aneurysm. Recommend annual imaging followup by CTA or MRA. This recommendation follows 2010 ACCF/AHA/AATS/ACR/ASA/SCA/SCAI/SIR/STS/SVM Guidelines for the Diagnosis and Management of Patients with Thoracic Aortic Disease. Circulation. 2010; 121: V409-W119. Aortic aneurysm NOS (ICD10-I71.9) Electronically Signed   By: Mitzi Hansen M.D.   On: 04/25/2019 01:14   Result Date: 04/25/2019 CLINICAL DATA:  53 y/o  M;  stroke for follow-up. EXAM: CT ANGIOGRAPHY HEAD AND NECK TECHNIQUE: Multidetector CT imaging of the head and neck was performed using the standard protocol during bolus administration of intravenous contrast. Multiplanar CT image reconstructions and MIPs were obtained to evaluate the vascular anatomy. Carotid stenosis measurements (when applicable) are obtained utilizing NASCET criteria, using the distal internal carotid diameter as the denominator. CONTRAST:  75mL OMNIPAQUE IOHEXOL 350 MG/ML SOLN COMPARISON:  04/25/2019 CT head. 12/28/2009 MRI and MRA head. 12/28/2009 MRA neck. FINDINGS: CTA NECK FINDINGS Aortic arch: Ascending aortic aneurysm measuring 4.0 cm. Three-vessel arch. No dissection. Right carotid system: No evidence of dissection, stenosis (50% or greater) or occlusion. Left carotid system: No evidence of dissection, stenosis (50% or greater) or occlusion. Vertebral arteries: Codominant. No evidence of dissection, stenosis (50% or greater) or occlusion. Skeleton: Negative. Other neck: Negative. Upper chest: Negative. Review of the MIP images confirms the above findings CTA  HEAD FINDINGS Anterior circulation: No significant stenosis, proximal occlusion, aneurysm, or vascular malformation. Posterior circulation: No significant stenosis, proximal occlusion, aneurysm, or vascular malformation. Venous sinuses: As permitted by contrast timing, patent. Anatomic variants: Left dominant A2 and A3. Review of the MIP images confirms the above findings IMPRESSION: 1. Patent carotid and vertebral arteries. No dissection, aneurysm, or hemodynamically significant stenosis utilizing NASCET criteria. 2. Patent anterior and posterior intracranial circulation. No large vessel occlusion, aneurysm, or significant stenosis. These results were called by telephone at the time of interpretation on 04/25/2019 at 12:37 am to Dr. Milon Dikes , who verbally acknowledged these results. Electronically Signed: By: Mitzi Hansen M.D. On: 04/25/2019 00:38   Ct Angio Chest Pe W And/or Wo Contrast  Result Date: 04/23/2019 CLINICAL DATA:  Shortness of breath, complex chest pain, symptoms for 2 days; history hypertension, primary hypercoagulable state, thoracic aortic aneurysm EXAM: CT ANGIOGRAPHY CHEST WITH CONTRAST TECHNIQUE: Multidetector CT imaging of the chest was performed using the standard protocol during bolus administration of intravenous contrast. Multiplanar CT image reconstructions and MIPs were obtained to evaluate the vascular anatomy. CONTRAST:  51mL OMNIPAQUE IOHEXOL 350 MG/ML SOLN IV COMPARISON:  02/03/2019 FINDINGS: Cardiovascular: Minimal atherosclerotic calcification aorta. Aneurysmal dilatation of the ascending thoracic aorta 4.5 cm transverse image 51, unchanged. No para-aortic hemorrhage. Pulmonary arteries well opacified and patent. No evidence of pulmonary embolism. No pericardial effusion. Mediastinum/Nodes: Esophagus unremarkable. Base of cervical region normal appearance. No thoracic adenopathy. Lungs/Pleura: Lungs clear. Stable lateral RIGHT lower lobe nodule 4 mm diameter image 98. No infiltrate, pleural effusion or pneumothorax. Upper Abdomen: Small cysts within liver. Remaining visualized upper abdomen unremarkable Musculoskeletal: Normal appearance Review of the MIP images confirms the above findings. IMPRESSION: No evidence of pulmonary embolism. Stable RIGHT lower lobe nodule. Stable aneurysmal dilatation ascending thoracic aorta 4.5 cm transverse, recommendation below. Ascending thoracic aortic aneurysm. Recommend semi-annual imaging followup by CTA or MRA and referral to cardiothoracic surgery if not already obtained. This recommendation follows 2010 ACCF/AHA/AATS/ACR/ASA/SCA/SCAI/SIR/STS/SVM Guidelines for the Diagnosis and Management of Patients With Thoracic Aortic Disease. Circulation. 2010; 121: M768-G881. Aortic aneurysm NOS (ICD10-I71.9) Aortic Atherosclerosis (ICD10-I70.0).  Aortic aneurysm NOS (ICD10-I71.9). Electronically Signed   By: Ulyses Southward M.D.   On: 04/23/2019 17:47   Mr Brain Wo Contrast  Result Date: 04/25/2019 CLINICAL DATA:  53 y/o  M; numbness and tingling in the hands. EXAM: MRI HEAD WITHOUT CONTRAST TECHNIQUE: Multiplanar, multiecho pulse sequences of the brain and surrounding structures were obtained without intravenous contrast. COMPARISON:  04/25/2019 CT head and CTA head. 04/11/2010 MRI of the head. FINDINGS: Brain: No acute infarction, hemorrhage, hydrocephalus, extra-axial collection or mass lesion. Punctate and early confluent nonspecific T2 FLAIR hyperintensities in subcortical and periventricular white matter are compatible with mild to moderate chronic microvascular ischemic changes. White matter changes are progressed from 2011. Vascular: Normal flow voids. Skull and upper cervical spine: Normal marrow signal. Sinuses/Orbits: Mucosal thickening of the right frontal sinus and anterior ethmoid air cells. No additional abnormal signal of included paranasal sinuses or the mastoid air cells. Orbits are unremarkable. Other: None. IMPRESSION: 1. No acute intracranial abnormality identified. 2. Mild-to-moderate chronic microvascular ischemic changes of the brain with progression from 2011. 3. Right frontal and anterior ethmoid sinus mucosal thickening. Electronically Signed   By: Mitzi Hansen M.D.   On: 04/25/2019 01:10   Dg Chest Portable 1 View  Result Date: 04/23/2019 CLINICAL DATA:  Chest pain EXAM: PORTABLE CHEST 1 VIEW COMPARISON:  December 27, 2016 FINDINGS: The heart size and  mediastinal contours are within normal limits. Both lungs are clear. The visualized skeletal structures are unremarkable. IMPRESSION: No active disease. Electronically Signed   By: Gerome Sam III M.D   On: 04/23/2019 16:39   Ct Head Code Stroke Wo Contrast  Result Date: 04/25/2019 CLINICAL DATA:  Code stroke. 53 y/o M; left-sided weakness and facial droop. EXAM:  CT HEAD WITHOUT CONTRAST TECHNIQUE: Contiguous axial images were obtained from the base of the skull through the vertex without intravenous contrast. COMPARISON:  04/11/2010 MRI of the head. FINDINGS: Brain: No evidence of acute infarction, hemorrhage, hydrocephalus, extra-axial collection or mass lesion/mass effect. Nonspecific white matter hypodensities are compatible with chronic microvascular ischemic changes. Vascular: No hyperdense vessel or unexpected calcification. Skull: Normal. Negative for fracture or focal lesion. Sinuses/Orbits: Partial opacification of the right anterior ethmoid air cells. Paranasal sinuses and the mastoid air cells are otherwise normally aerated. Orbits are unremarkable. Other: None. ASPECTS [facility] Stroke Program Early CT Score) - Ganglionic level infarction (caudate, lentiform nuclei, internal capsule, insula, M1-M3 cortex): 7 - Supraganglionic infarction (M4-M6 cortex): 3 Total score (0-10 with 10 being normal): 10 IMPRESSION: 1. No acute intracranial abnormality identified. 2. ASPECTS is 10. 3. Non-specific white matter changes likely representing chronic microvascular ischemic disease. These results were communicated to Dr. Wilford Corner at 12:20 amon 5/5/2020by text page via the [facility] messaging system. Electronically Signed   By: Mitzi Hansen M.D.   On: 04/25/2019 00:22    Procedures Procedures  CRITICAL CARE Performed by: Dione Booze Total critical care time: 35 minutes Critical care time was exclusive of separately billable procedures and treating other patients. Critical care was necessary to treat or prevent imminent or life-threatening deterioration. Critical care was time spent personally by me on the following activities: development of treatment plan with patient and/or surrogate as well as nursing, discussions with consultants, evaluation of patient's response to treatment, examination of patient, obtaining history from patient or surrogate, ordering and  performing treatments and interventions, ordering and review of laboratory studies, ordering and review of radiographic studies, pulse oximetry and re-evaluation of patient's condition.  Medications Ordered in ED Medications  potassium chloride SA (K-DUR) CR tablet 40 mEq (has no administration in time range)  iohexol (OMNIPAQUE) 350 MG/ML injection 75 mL (75 mLs Intravenous Contrast Given 04/25/19 0025)  LORazepam (ATIVAN) injection 1 mg (1 mg Intravenous Given 04/25/19 0039)     Initial Impression / Assessment and Plan / ED Course  I have reviewed the triage vital signs and the nursing notes.  Pertinent labs & imaging results that were available during my care of the patient were reviewed by me and considered in my medical decision making (see chart for details).  Possible stroke or TIA.  Deficits are somewhat confusing.  Crossed weakness would suggest a brainstem stroke, but I am having difficulty putting in 1 lesion the right facial droop and decreased sensation on the left side.  He was seen in conjunction with Dr. Wilford Corner of neurology service.  He is going for emergent CT scan.  Deficits appear minor, and he probably would not be a candidate for thrombolytic therapy.  Old records are reviewed, confirming two ED visits yesterday for nonspecific chest pain and hypokalemia.  Labs show significant improvement in potassium level, now up to 3.3.  He is given additional potassium.  CT of head is negative for stroke.  CT angiogram of the head and neck showed no significant blockages.  He was sent for MRI of the brain which showed no evidence  of acute stroke.  Symptoms appear to be functional.  He was ambulated in the ED and stated he felt unsteady, but actually ambulated well without assistance.  He was felt to be safe for discharge.  It is possible this was a transient ischemic attack.  The only investigation that needs to be done to complete his work-up would be an echocardiogram, which can be obtained as  an outpatient.  He is referred to neurology for further outpatient evaluation.  Final Clinical Impressions(s) / ED Diagnoses   Final diagnoses:  Weakness  Paresthesia  Hypokalemia    ED Discharge Orders    None       Dione BoozeGlick, Deasiah Hagberg, MD 04/25/19 0205

## 2019-04-25 NOTE — Discharge Instructions (Addendum)
Your evaluation today showed no sign of a stroke. Please follow up with one of the neurologists, and with your primary care provider. Return if symptoms are getting worse.  Continue taking your potassium until finished.

## 2019-04-25 NOTE — ED Triage Notes (Signed)
Per [employer],  Pt from home. Pt reports weakness, numbness, and tingling to L side of body since 10 pm last night 5/4. Pt reports weakness all over yesterday, seen in ED twice and discharged. Pt has sensory deficits to l side, facial droop to R side. Pt alert and oriented upon arrival, GCS 15.

## 2019-04-25 NOTE — Consult Note (Signed)
Neurology Consultation  Reason for Consult: Code stroke - [facility] - for Lt sided weakness Referring Physician: Dr. Preston Fleeting - EDP  CC: left arm weakness  History is obtained from: patient, chart  HPI: [name] is a 53 y.o. male PMH of TIA 10 years ago, PE 2009 not on AC, HTN, HLD, thoracic aortic aneurysm, brought into the emergency room by [facility] after he called them for evaluation of left-sided weakness. The patient was seen earlier this morning in the emergency room for chest pain and discharged home after CT angiogram of the chest was done that did not reveal any PE, unchanged ECG compared to prior and his cardiac catheterization report from 2017 that showed normal coronaries.  He did have hypokalemia and was given IV and oral potassium replacement along with ketorolac for pain and discharged home with prescription for potassium supplementation. He said he went to bed night of 04/24/2019 and woke up around 10:00 with some weakness and numbness on the left arm.  He also reports some right-sided facial weakness.  He called EMS also noted some mild left-sided numbness weakness and subjective sensory deficits which were waxing and waning throughout the EMS drive to the hospital.  EMS did note the right-sided facial droop.  He denies any illicit drug use.  Denies any visual symptoms.  Denies any current chest pain.  Denies any headaches nausea vomiting.  Denies any preceding illnesses sicknesses shortness of breath cough nausea vomiting. He does have some sort of a primary hypercoagulable state which had caused a PE many years ago with no definitive etiology.    LKW: 10 PM on 04/24/2019 tpa given?: no, NIH 2, inconsistent exam Premorbid modified Rankin scale (mRS): 0 ROS: ROS was performed and is negative except as noted in the HPI.   Past Medical History:  Diagnosis Date  . ABDOMINAL PAIN-EPIGASTRIC   . COAGULOPATHY, COUMADIN-INDUCED   . DYSPHAGIA OROPHARYNGEAL PHASE   .  Heartburn   . HYPERLIPIDEMIA   . HYPERTENSION   . HYPERTENSION NEC   . HYPOTHYROIDISM   . PE   . Primary hypercoagulable state (HCC)   . Thoracic aneurysm without mention of rupture   . Thoracic aortic aneurysm (HCC)   . VASCULITIS     Family History  Problem Relation Age of Onset  . Heart disease Mother   . Hypertension Father     Social History:   reports that he has never smoked. He has never used smokeless tobacco. He reports that he does not drink alcohol or use drugs.  Medications No current facility-administered medications for this encounter.   Current Outpatient Medications:  .  amLODipine (NORVASC) 10 MG tablet, TAKE 1 TABLET BY MOUTH EVERY DAY (Patient taking differently: Take 10 mg by mouth daily. ), Disp: 90 tablet, Rfl: 0 .  aspirin 81 MG tablet, Take 81 mg by mouth daily., Disp: , Rfl:  .  levothyroxine (SYNTHROID, LEVOTHROID) 175 MCG tablet, Take 175 mcg by mouth daily before breakfast., Disp: , Rfl:  .  methocarbamol (ROBAXIN) 500 MG tablet, Take 1 tablet (500 mg total) by mouth 2 (two) times daily., Disp: 16 tablet, Rfl: 0 .  metoprolol succinate (TOPROL-XL) 50 MG 24 hr tablet, Take 1 tablet (50 mg total) by mouth daily., Disp: 90 tablet, Rfl: 3 .  potassium chloride SA (K-DUR) 20 MEQ tablet, Take 1 tablet (20 mEq total) by mouth 2 (two) times daily., Disp: 20 tablet, Rfl: 0 .  sertraline (ZOLOFT) 50 MG tablet, Take 50  mg by mouth daily., Disp: , Rfl:  .  triamcinolone cream (KENALOG) 0.1 %, Apply 1 application topically 2 (two) times daily. , Disp: , Rfl:  .  valsartan (DIOVAN) 320 MG tablet, Take 1 tablet (320 mg total) by mouth daily., Disp: 90 tablet, Rfl: 3  Exam: Current vital signs: Ht 6\' 3"  (1.905 m)   Wt 102.6 kg   BMI 28.27 kg/m  Vital signs in last 24 hours: Temp:  [98.5 F (36.9 C)] 98.5 F (36.9 C) (05/04 0123) Pulse Rate:  [58-73] 64 (05/04 0600) Resp:  [6-22] 15 (05/04 0600) BP: (141-156)/(85-97) 150/88 (05/04 0600) SpO2:  [96 %-100 %]  97 % (05/04 0600) Weight:  [102.5 kg-102.6 kg] 102.6 kg (05/05 0000) General: Awake alert in no distress HEENT: Normocephalic atraumatic, dry oral mucous membranes Lungs: Clear to auscultation CVS: S1-S2 heard regular rate rhythm Abdomen: Soft nondistended nontender Extremities and skin: Warm well perfused extremities with intact pulses.  He has multiple rashes on his skin, which he reports are his baseline because of a pre-existing skin condition. Neurological exam He is awake alert oriented x3 Speech is not dysarthric Naming comprehension repetition is intact Cranial nerves: His pupils are equal round reactive light, extraocular movements are intact, visual fields are full, examination of his face reveals slight inability to recruit facial muscles on the right although it seems volitional, no auditory acuity issues noted to conversation, tongue midline, palate midline, shoulder shrug intact. Motor exam: He is antigravity in all 4 extremities with no vertical drift-individual muscle testing reveals a slight left hemiparesis with 4+/5 in left upper and left lower extremity when compared to full-strength 5/5 in the right upper and lower extremity. Sensory exam: He reports increased sensation on the right face and the right half of the body in comparison to the left Coordination: No ataxia noted Gait testing was deferred at this time but according to EMS he was able to walk to the truck without a problem. NIH stroke scale-2  Labs I have reviewed labs in epic and the results pertinent to this consultation are:  CBC    Component Value Date/Time   WBC 7.9 04/23/2019 1605   RBC 5.02 04/23/2019 1605   HGB 16.3 04/23/2019 1605   HCT 45.7 04/23/2019 1605   PLT 221 04/23/2019 1605   MCV 91.0 04/23/2019 1605   MCH 32.5 04/23/2019 1605   MCHC 35.7 04/23/2019 1605   RDW 13.1 04/23/2019 1605   LYMPHSABS 1.8 04/23/2019 1605   MONOABS 0.8 04/23/2019 1605   EOSABS 0.0 04/23/2019 1605   BASOSABS  0.0 04/23/2019 1605    CMP     Component Value Date/Time   NA 144 04/24/2019 0224   NA 143 02/24/2019 1142   K 2.9 (L) 04/24/2019 0224   CL 110 04/24/2019 0224   CO2 22 04/24/2019 0224   GLUCOSE 91 04/24/2019 0224   BUN 13 04/24/2019 0224   BUN 12 02/24/2019 1142   CREATININE 1.17 04/24/2019 0224   CALCIUM 8.7 (L) 04/24/2019 0224   PROT 7.1 04/23/2019 1605   ALBUMIN 3.8 04/23/2019 1605   AST 28 04/23/2019 1605   ALT 21 04/23/2019 1605   ALKPHOS 52 04/23/2019 1605   BILITOT 1.4 (H) 04/23/2019 1605   GFRNONAA >60 04/24/2019 0224   GFRAA >60 04/24/2019 0224    Imaging I have reviewed the images obtained:  CT-scan of the brain-aspects 10, no bleed. CTA of the head and neck was ordered by me-no LVO.  Compared to a MRA of  2011, no significant change or atherosclerosis.  I have ordered a stat MRI of the brain at this time.  Assessment: 53 year old man with past history of TIA 10 years ago, PE in 2009 at that time was on anticoagulation but currently not on any anticoagulation, hypertension, hyperlipidemia, thoracic aortic aneurysm and some sort of a primary coagulation disorder presented with sudden onset of left-sided weakness. On examination, his examination was slightly inconsistent-unless he is having a brainstem infarct.  His NIH stroke scale was low-2.  I did not offer him TPA because of the low stroke scale. There was no emergent large vessel occlusion, for which reason he was not offered endovascular thrombectomy. I have proceeded with a stat MRI of the brain with thin slices through the brainstem which is being done at this time. I will update my recommendations after the MRI results become available.  ADDENDUM MRI brain reviwed - no stroke. Chronic WM disease. Exam less suggestive of a vascular neurological etiology and more consistent with a non-organic etiology. I would not recommend admission from a neurological perspective. Can get outpatient f/u with neurology if  desired. Discussed with Dr. Preston FleetingGlick and updated my recommendations.  -- Milon DikesAshish Erdem Naas, MD [facility] Pager: [phone] If 7pm to 7am, please call on call as listed on AMION.

## 2019-04-25 NOTE — ED Notes (Signed)
Patient was ambulated down the hall approximately 6 feet, patient advised that he began to feel unsteady on his feet and felt that he was walking crooked. Patient was able to do so without any assistance. No gait abnormalities noted by tech during ambulation.

## 2019-04-26 DIAGNOSIS — E876 Hypokalemia: Secondary | ICD-10-CM | POA: Diagnosis not present

## 2019-04-26 DIAGNOSIS — E039 Hypothyroidism, unspecified: Secondary | ICD-10-CM | POA: Diagnosis not present

## 2019-05-01 DIAGNOSIS — E039 Hypothyroidism, unspecified: Secondary | ICD-10-CM | POA: Diagnosis not present

## 2019-05-01 DIAGNOSIS — I10 Essential (primary) hypertension: Secondary | ICD-10-CM | POA: Diagnosis not present

## 2019-05-01 DIAGNOSIS — E876 Hypokalemia: Secondary | ICD-10-CM | POA: Diagnosis not present

## 2019-05-01 DIAGNOSIS — F410 Panic disorder [episodic paroxysmal anxiety] without agoraphobia: Secondary | ICD-10-CM | POA: Diagnosis not present

## 2019-05-04 ENCOUNTER — Telehealth: Payer: Self-pay | Admitting: Interventional Cardiology

## 2019-05-04 NOTE — Telephone Encounter (Signed)
Late entry:  Called and spoke to patient who states that he had an episode of HTN last night with BP at 183/106. Spoke with on call provider and was instructed to take an extra 5 mg of amlodipine. Patient states that BP was 110/74 today HR 71. Patient states the had a slight HA last night, but denies having any Sx at this time. Patient's PCP had decreased his amlodipine to 5 mg QD previously d/t hypotension. Patient also takes Toprol 50 mg QD and valsartan 320 mg QD. Instructed patient to continue amlodipine 5 mg QD and he can take the other 5 mg PRN for HTN. Instructed patient to let us know if he experienced any HTN/hypotension or if he developed any Sx. Patient verbalized understanding and thanked me for the call.   Patient states that he had hypokalemia while in the hospital recently and was started on k-dur but recheck with PCP was 4.4 so they stopped it. Med list updated.

## 2019-05-04 NOTE — Telephone Encounter (Signed)
New Message   Pt c/o BP issue:  1. What are your last 5 BP readings? 183/106 (Last night). 110/74 (today)  2. Are you having any other symptoms (ex. Dizziness, headache, blurred vision, passed out)? Headache  3. What is your medication issue? None   Patient states that PCP had advised you to cut the amlodipine to 5mg . Last night when he spoke with cardiology on call they told him take the additional 5mg . The on call advised him to call back this morning and give the update.

## 2019-05-05 NOTE — Telephone Encounter (Signed)
I agree with using the additional amlodipine prn.

## 2019-05-12 ENCOUNTER — Telehealth: Payer: Self-pay | Admitting: Interventional Cardiology

## 2019-05-12 NOTE — Telephone Encounter (Signed)
Called and spoke to patient. He states that his BP started spiking again in the afternoons to 165/95. He states that he has HAs with this. Denies additional Sx. Patient states that he took an extra 5 mg of amlodipine and his BP is down to 120/78. He states that he notices a spike usually in the afternoons. Discussed low salt diet. Instructed the patient to take amlodipine 5 mg BID to see if this gives him better coverage. Patient will monitor BP and call to report next week.

## 2019-05-12 NOTE — Telephone Encounter (Signed)
New message   Pt c/o medication issue:  1. Name of Medication: amLODipine (NORVASC) 10 MG tablet  2. How are you currently taking this medication (dosage and times per day)? Patient states that he is taking 5mg   And is now taking 10 mg.  3. Are you having a reaction (difficulty breathing--STAT)?no   4. What is your medication issue? He states that his b/p spiked and is now taking 10 mg.

## 2019-05-14 ENCOUNTER — Other Ambulatory Visit: Payer: Self-pay | Admitting: Interventional Cardiology

## 2019-05-16 ENCOUNTER — Telehealth: Payer: Self-pay | Admitting: Interventional Cardiology

## 2019-05-16 NOTE — Telephone Encounter (Signed)
Called and spoke to patient. Patient reporting BP readings 133/86, 120/79, 122/80, 125/82. He states that his BPs have been better taking the amlodipine 5 mg BID. He states that he has not had any more spikes and that this seems to work well for him. Instructed for the patient to continue taking the amlodipine 5 mg BID and continue to monitor BP.  

## 2019-05-16 NOTE — Telephone Encounter (Signed)
Called and spoke to patient. Patient reporting BP readings 133/86, 120/79, 122/80, 125/82. He states that his BPs have been better taking the amlodipine 5 mg BID. He states that he has not had any more spikes and that this seems to work well for him. Instructed for the patient to continue taking the amlodipine 5 mg BID and continue to monitor BP.

## 2019-05-16 NOTE — Telephone Encounter (Signed)
New Message    Pt is calling to leave his BP readings for [place]  Today- 133/86 Yesterday- 120/79   Please call back

## 2019-05-17 DIAGNOSIS — E876 Hypokalemia: Secondary | ICD-10-CM | POA: Diagnosis not present

## 2019-06-01 DIAGNOSIS — E876 Hypokalemia: Secondary | ICD-10-CM | POA: Diagnosis not present

## 2019-07-03 ENCOUNTER — Other Ambulatory Visit: Payer: Self-pay | Admitting: Interventional Cardiology

## 2019-07-26 ENCOUNTER — Telehealth: Payer: Self-pay | Admitting: Interventional Cardiology

## 2019-07-26 NOTE — Telephone Encounter (Signed)
Pt c/o BP issue: STAT if pt c/o blurred vision, one-sided weakness or slurred speech  1. What are your last 5 BP readings? 155/95, 170/95 and pulse running about 80  2. Are you having any other symptoms (ex. Dizziness, headache, blurred vision, passed out)? Headaches, nausea, a litlle chest discomfort, and pain in lower throat  3. What is your BP issue? Blood Pressure is running and  A little chest Discomfort

## 2019-07-26 NOTE — Telephone Encounter (Signed)
If BP stays high, would change toprol to carvedilol 12.5 mg BID to see if there is better BP control.  If titration of Coreg to 25 BID did not control BP, then would add hydralazine.

## 2019-07-26 NOTE — Telephone Encounter (Signed)
Spoke with DOD, Dr. Lovena Le since patient symptomatic w/ HTN and Hx of TAA. Patient will take an extra Toprol 50 mg today. If Sx change or worsen patient will be evaluated in the ER.

## 2019-07-26 NOTE — Telephone Encounter (Signed)
Patient states that since he has been hypertensive since Monday night. He states that he started to feel poorly after he was cleaning the bathroom. He states that his BP was 170/95 HR 100. Patient states that his BP has been 155/95 and 150/90 HR 80s since then. Patient takes amlodipine 10 mg QD, valsartan 320 mg QD, and Toprol 50 mg QD. Patient states that he has had a HA for which he is taking tylenol.  He states that he has had intermittent episodes of chest discomfort located in his upper chest up into his neck associated with nausea. He states that this is intermittent and only happens at rest after a large meal. Patient does not take anything for indigestion. He states that the discomfort does not worsen with activity. He denies SOB or any other Sx. Patient has TAA last measuring 4.5 cm on CTA 02/03/19. History of abn stress test but clean coronaries on cath afterwards. Will forward to Dr. Irish Lack for review and recommendation regarding BP and chest discomfort. Patient will let us know if his Sx change or worsen.

## 2019-07-27 NOTE — Telephone Encounter (Signed)
Called to follow up with patient regarding his BP. He states that his BP today is 110/75, HR 70. Patient states that he has still been having intermittent episodes of the chest/throat discomfort, but is not as severe and has gotten better. Instructed the patient to continue to monitor his BP and call us back to report. Patient aware of the importance of good BP control and that if he continues to have higher BP we will try to switch his meds (see below) for better control. Patient will let us know if his Sx change or worsen.

## 2019-08-17 DIAGNOSIS — I10 Essential (primary) hypertension: Secondary | ICD-10-CM | POA: Diagnosis not present

## 2019-08-17 DIAGNOSIS — E039 Hypothyroidism, unspecified: Secondary | ICD-10-CM | POA: Diagnosis not present

## 2019-08-17 DIAGNOSIS — E876 Hypokalemia: Secondary | ICD-10-CM | POA: Diagnosis not present

## 2019-08-24 DIAGNOSIS — E876 Hypokalemia: Secondary | ICD-10-CM | POA: Diagnosis not present

## 2019-10-03 ENCOUNTER — Other Ambulatory Visit: Payer: Self-pay | Admitting: Interventional Cardiology

## 2019-11-07 ENCOUNTER — Telehealth: Payer: Self-pay | Admitting: Interventional Cardiology

## 2019-11-07 DIAGNOSIS — Z79899 Other long term (current) drug therapy: Secondary | ICD-10-CM | POA: Diagnosis not present

## 2019-11-07 DIAGNOSIS — Z20828 Contact with and (suspected) exposure to other viral communicable diseases: Secondary | ICD-10-CM | POA: Diagnosis not present

## 2019-11-07 DIAGNOSIS — Z7982 Long term (current) use of aspirin: Secondary | ICD-10-CM | POA: Diagnosis not present

## 2019-11-07 DIAGNOSIS — R9431 Abnormal electrocardiogram [ECG] [EKG]: Secondary | ICD-10-CM | POA: Diagnosis not present

## 2019-11-07 DIAGNOSIS — R208 Other disturbances of skin sensation: Secondary | ICD-10-CM | POA: Diagnosis not present

## 2019-11-07 DIAGNOSIS — Z91040 Latex allergy status: Secondary | ICD-10-CM | POA: Diagnosis not present

## 2019-11-07 DIAGNOSIS — R519 Headache, unspecified: Secondary | ICD-10-CM | POA: Diagnosis not present

## 2019-11-07 DIAGNOSIS — I69321 Dysphasia following cerebral infarction: Secondary | ICD-10-CM | POA: Diagnosis not present

## 2019-11-07 DIAGNOSIS — Z6828 Body mass index (BMI) 28.0-28.9, adult: Secondary | ICD-10-CM | POA: Diagnosis not present

## 2019-11-07 DIAGNOSIS — G4489 Other headache syndrome: Secondary | ICD-10-CM | POA: Diagnosis not present

## 2019-11-07 DIAGNOSIS — I10 Essential (primary) hypertension: Secondary | ICD-10-CM

## 2019-11-07 DIAGNOSIS — E785 Hyperlipidemia, unspecified: Secondary | ICD-10-CM | POA: Diagnosis not present

## 2019-11-07 DIAGNOSIS — R200 Anesthesia of skin: Secondary | ICD-10-CM | POA: Diagnosis not present

## 2019-11-07 DIAGNOSIS — R070 Pain in throat: Secondary | ICD-10-CM | POA: Diagnosis not present

## 2019-11-07 DIAGNOSIS — E039 Hypothyroidism, unspecified: Secondary | ICD-10-CM | POA: Diagnosis not present

## 2019-11-07 NOTE — Telephone Encounter (Signed)
Pt c/o BP issue: STAT if pt c/o blurred vision, one-sided weakness or slurred speech  1. What are your last 5 BP readings? Last 170/100 today 155/95  2. Are you having any other symptoms (ex. Dizziness, headache, blurred vision, passed out)? Headache 3. What is your BP issue? Patient concering about it keeps going up.

## 2019-11-07 NOTE — Telephone Encounter (Signed)
I would start spironolactone 25 mg daily with  Bmet in 1 week.

## 2019-11-07 NOTE — Telephone Encounter (Signed)
Pt complaining of elevated B/P since last night.  Pt reports readings today of 170/100 and 155/95.  Pt also complaining of some mild left CP more toward his left shoulder and HA that started yesterday as well.  Pt reports dizziness on standing x a few months.  Per pt meds are being taken as prescribed.  Advised pt symptoms will be forwarded to provider.  Advised if Sx increase or new Sx develop to contact this office or call 911.  Pt verbalizes understanding and agrees with this plan.

## 2019-11-08 DIAGNOSIS — R519 Headache, unspecified: Secondary | ICD-10-CM | POA: Diagnosis not present

## 2019-11-08 DIAGNOSIS — I10 Essential (primary) hypertension: Secondary | ICD-10-CM | POA: Diagnosis not present

## 2019-11-08 MED ORDER — SPIRONOLACTONE 25 MG PO TABS: 25.0000 mg | ORAL_TABLET | Freq: Every day | ORAL | 3 refills | Status: DC

## 2019-11-08 NOTE — Telephone Encounter (Signed)
Called and spoke to patient and made him aware that Dr. Irish Lack would like for him to start spironolactone 25 mg QD. BMET scheduled for 11/25. Rx sent to preferred pharmacy. Instructed patient to continue to monitor BP and let us know if his Sx change or worsen. Patient verbalized understanding and thanked me for the clal.

## 2019-11-15 ENCOUNTER — Other Ambulatory Visit: Payer: Self-pay

## 2019-11-15 ENCOUNTER — Other Ambulatory Visit: Payer: BC Managed Care – PPO | Admitting: *Deleted

## 2019-11-15 ENCOUNTER — Telehealth: Payer: Self-pay | Admitting: *Deleted

## 2019-11-15 DIAGNOSIS — I10 Essential (primary) hypertension: Secondary | ICD-10-CM

## 2019-11-15 NOTE — Telephone Encounter (Signed)
Per patient Covid testing x 1 week ago in the ED

## 2019-11-16 LAB — BASIC METABOLIC PANEL
BUN/Creatinine Ratio: 10 (ref 9–20)
BUN: 12 mg/dL (ref 6–24)
CO2: 25 mmol/L (ref 20–29)
Calcium: 9.6 mg/dL (ref 8.7–10.2)
Chloride: 106 mmol/L (ref 96–106)
Creatinine, Ser: 1.16 mg/dL (ref 0.76–1.27)
GFR calc Af Amer: 84 mL/min/{1.73_m2} (ref >59)
GFR calc non Af Amer: 73 mL/min/{1.73_m2} (ref >59)
Glucose: 94 mg/dL (ref 65–99)
Potassium: 4.5 mmol/L (ref 3.5–5.2)
Sodium: 143 mmol/L (ref 134–144)

## 2020-01-01 ENCOUNTER — Other Ambulatory Visit: Payer: Self-pay | Admitting: Interventional Cardiology

## 2020-01-21 NOTE — Progress Notes (Signed)
Cardiology Office Note   Date:  01/22/2020   ID:  [name], DOB Dec 19, 1966, MRN [id number]  PCP:  Marcus Cruel, MD    No chief complaint on file.  Thoracic aortic aneurysm  Wt Readings from Last 3 Encounters:  01/22/20 247 lb (112 kg)  04/25/19 226 lb 3.1 oz (102.6 kg)  04/24/19 225 lb 15.5 oz (102.5 kg)       History of Present Illness: [name] is a 54 y.o. male  with a bicuspid aortic valve, and associated thoracic aortic aneurysm.   MRI in 2016 showed that since 2013, his thoracic aortic aneurysm has increased from 4.3 cm to 4.5 cm.  He had some chest pain and was admitted to the hospital in 12/17. Cath was negative.   More CP led to CT scan in early Jan 2018 and thoracic aneurysm was the same size.   His father passed away from CHF in 03-25-2023; he had an LVAD.   CT scan in 2018 showed saneurysm size of 4.5 cm.  Denies : Dizziness. Leg edema. Nitroglycerin use. Orthopnea. Palpitations. Paroxysmal nocturnal dyspnea. Shortness of breath. Syncope.   He was in the ER for HTN in 10/2019.  BP was 180/110. Negative w/u.  BP better since spironolactone started.   Since the last visit, he has had some chest pain.  It is a dull pain that occurs more randomly.  It can become sharp with certain movements.  Pain will last several minutes.  If he starts walking, that will help.  Worse with stress.  Walking will help calm him down and pain resolves.  He is trying to eat healthy.  He walks 2x/day, total of 30-45 minutes.      Past Medical History:  Diagnosis Date  . ABDOMINAL PAIN-EPIGASTRIC   . COAGULOPATHY, COUMADIN-INDUCED   . DYSPHAGIA OROPHARYNGEAL PHASE   . Heartburn   . HYPERLIPIDEMIA   . HYPERTENSION   . HYPERTENSION NEC   . HYPOTHYROIDISM   . PE   . Primary hypercoagulable state ([place])   . Thoracic aneurysm without mention of rupture   . Thoracic aortic aneurysm ([place])   . VASCULITIS     Past Surgical History:  Procedure  Laterality Date  . CARDIAC CATHETERIZATION N/A 12/08/2016   Procedure: Left Heart Cath and Coronary Angiography;  Surgeon: Troy Sine, MD;  Location: [facility];  Service: Cardiovascular;  Laterality: N/A;  . ELBOW SURGERY Left 2010     Current Outpatient Medications  Medication Sig Dispense Refill  . amLODipine (NORVASC) 10 MG tablet TAKE 1 TABLET BY MOUTH EVERY DAY 90 tablet 3  . aspirin 81 MG tablet Take 81 mg by mouth daily.    Marland Kitchen levothyroxine (SYNTHROID, LEVOTHROID) 175 MCG tablet Take 175 mcg by mouth daily before breakfast.    . metoprolol succinate (TOPROL-XL) 50 MG 24 hr tablet Take 1 tablet (50 mg total) by mouth daily. 90 tablet 3  . potassium chloride SA (KLOR-CON) 20 MEQ tablet Take 20 mEq by mouth 3 (three) times daily.    . sertraline (ZOLOFT) 100 MG tablet Take 100 mg by mouth every morning.    Marland Kitchen spironolactone (ALDACTONE) 25 MG tablet Take 1 tablet (25 mg total) by mouth daily. 90 tablet 3  . valsartan (DIOVAN) 320 MG tablet Take 1 tablet (320 mg total) by mouth daily. Please make annual appt with Dr. Irish Lack for refills. [phone]. 1st attempt. 30 tablet 0   No current facility-administered medications for this  visit.    Allergies:   Latex and Other    Social History:  The patient  reports that he has never smoked. He has never used smokeless tobacco. He reports that he does not drink alcohol or use drugs.   Family History:  The patient's family history includes Heart disease in his mother; Hypertension in his father.    ROS:  Please see the history of present illness.   Otherwise, review of systems are positive for weight gain.   All other systems are reviewed and negative.    PHYSICAL EXAM: VS:  BP 112/74   Pulse 63   Ht 6\' 3"  (1.905 m)   Wt 247 lb (112 kg)   SpO2 97%   BMI 30.87 kg/m  , BMI Body mass index is 30.87 kg/m. GEN: Well nourished, well developed, in no acute distress  HEENT: normal  Neck: no JVD, carotid bruits, or  masses Cardiac: RRR; no murmurs, rubs, or gallops,no edema  Respiratory:  clear to auscultation bilaterally, normal work of breathing GI: soft, nontender, nondistended, + BS MS: no deformity or atrophy  Skin: warm and dry, no rash Neuro:  Strength and sensation are intact Psych: euthymic mood, full affect   EKG:   The ekg ordered 04/2019 demonstrates NSR, no ST changes   Recent Labs: 04/25/2019: ALT 20; Hemoglobin 15.6; Platelets 189 11/15/2019: BUN 12; Creatinine, Ser 1.16; Potassium 4.5; Sodium 143   Lipid Panel    Component Value Date/Time   CHOL 140 12/07/2016 0247   TRIG 51 12/07/2016 0247   HDL 43 12/07/2016 0247   CHOLHDL 3.3 12/07/2016 0247   VLDL 10 12/07/2016 0247   LDLCALC 87 12/07/2016 0247     Other studies Reviewed: Additional studies/ records that were reviewed today with results demonstrating: 01/2019 echo showed normal LVEF, no AS, stable thoracic aortic aneurysm.   ASSESSMENT AND PLAN:  1. Bicuspid aortic valve: 2020 echo reassuring.  No sx of AS. 2. Thoracic aortic aneurysm: 2020 CT showed 45 mm as greatest dimension. Scheduled for repeat study on 01/30/2019.  Avoid straining.  Check lipids when he gets preCT blood work.  3. HTN: Avoid salt in diet.  Exercise target as noted below. 4. Obesity: Whole food, plant-based diet recommended.  Avopid processed foods.  5. CP does not appear to be cardiac related.   Current medicines are reviewed at length with the patient today.  The patient concerns regarding his medicines were addressed.  The following changes have been made:  No change  Labs/ tests ordered today include:  No orders of the defined types were placed in this encounter.   Recommend 150 minutes/week of aerobic exercise Low fat, low carb, high fiber diet recommended  Disposition:   FU in 1 year   Signed, 03/31/2019, MD  01/22/2020 3:19 PM    [facility] [address] [place], [place], [place]  [place] Phone: [phone]; Fax: [phone]

## 2020-01-22 ENCOUNTER — Encounter: Payer: Self-pay | Admitting: Interventional Cardiology

## 2020-01-22 ENCOUNTER — Other Ambulatory Visit: Payer: Self-pay

## 2020-01-22 ENCOUNTER — Ambulatory Visit (INDEPENDENT_AMBULATORY_CARE_PROVIDER_SITE_OTHER): Payer: 59 | Admitting: Interventional Cardiology

## 2020-01-22 VITALS — BP 112/74 | HR 63 | Ht 75.0 in | Wt 247.0 lb

## 2020-01-22 DIAGNOSIS — I10 Essential (primary) hypertension: Secondary | ICD-10-CM

## 2020-01-22 DIAGNOSIS — I712 Thoracic aortic aneurysm, without rupture, unspecified: Secondary | ICD-10-CM

## 2020-01-22 DIAGNOSIS — Q231 Congenital insufficiency of aortic valve: Secondary | ICD-10-CM

## 2020-01-22 DIAGNOSIS — E785 Hyperlipidemia, unspecified: Secondary | ICD-10-CM | POA: Diagnosis not present

## 2020-01-22 MED ORDER — VALSARTAN 320 MG PO TABS: 320.0000 mg | ORAL_TABLET | Freq: Every day | ORAL | 3 refills | Status: DC

## 2020-01-22 NOTE — Patient Instructions (Signed)
Medication Instructions:  Your physician recommends that you continue on your current medications as directed. Please refer to the Current Medication list given to you today.  *If you need a refill on your cardiac medications before your next appointment, please call your pharmacy*  Lab Work: Your physician recommends that you return for a FASTING lipid profile, liver function panel, and basic metabolic panel on 01/24/20  If you have labs (blood work) drawn today and your tests are completely normal, you will receive your results only by: Marland Kitchen MyChart Message (if you have MyChart) OR . A paper copy in the mail If you have any lab test that is abnormal or we need to change your treatment, we will call you to review the results.  Testing/Procedures: None ordered  Follow-Up: At [facility], you and your health needs are our priority.  As part of our continuing mission to provide you with exceptional heart care, we have created designated Provider Care Teams.  These Care Teams include your primary Cardiologist (physician) and Advanced Practice Providers (APPs -  Physician Assistants and Nurse Practitioners) who all work together to provide you with the care you need, when you need it.  Your next appointment:   12 months -  The format for your next appointment:   In Person  Provider:   You may see Lance Muss, MD or one of the following Advanced Practice Providers on your designated Care Team:    Ronie Spies, PA-C  Jacolyn Reedy, PA-C   Other Instructions

## 2020-01-24 ENCOUNTER — Other Ambulatory Visit: Payer: 59 | Admitting: *Deleted

## 2020-01-24 ENCOUNTER — Other Ambulatory Visit: Payer: Self-pay

## 2020-01-24 DIAGNOSIS — E785 Hyperlipidemia, unspecified: Secondary | ICD-10-CM

## 2020-01-24 DIAGNOSIS — I712 Thoracic aortic aneurysm, without rupture, unspecified: Secondary | ICD-10-CM

## 2020-01-24 DIAGNOSIS — Q231 Congenital insufficiency of aortic valve: Secondary | ICD-10-CM

## 2020-01-24 DIAGNOSIS — I10 Essential (primary) hypertension: Secondary | ICD-10-CM

## 2020-01-24 DIAGNOSIS — Z01812 Encounter for preprocedural laboratory examination: Secondary | ICD-10-CM

## 2020-01-25 ENCOUNTER — Other Ambulatory Visit: Payer: Self-pay | Admitting: Interventional Cardiology

## 2020-01-25 LAB — BASIC METABOLIC PANEL
BUN/Creatinine Ratio: 17 (ref 9–20)
BUN: 17 mg/dL (ref 6–24)
CO2: 21 mmol/L (ref 20–29)
Calcium: 9.5 mg/dL (ref 8.7–10.2)
Chloride: 107 mmol/L — ABNORMAL HIGH (ref 96–106)
Creatinine, Ser: 0.98 mg/dL (ref 0.76–1.27)
GFR calc Af Amer: 103 mL/min/{1.73_m2} (ref >59)
GFR calc non Af Amer: 89 mL/min/{1.73_m2} (ref >59)
Glucose: 80 mg/dL (ref 65–99)
Potassium: 4.4 mmol/L (ref 3.5–5.2)
Sodium: 144 mmol/L (ref 134–144)

## 2020-01-25 LAB — HEPATIC FUNCTION PANEL
ALT: 22 IU/L (ref 0–44)
AST: 29 IU/L (ref 0–40)
Albumin: 4.4 g/dL (ref 3.8–4.9)
Alkaline Phosphatase: 72 IU/L (ref 39–117)
Bilirubin Total: 0.5 mg/dL (ref 0.0–1.2)
Bilirubin, Direct: 0.13 mg/dL (ref 0.00–0.40)
Total Protein: 7.3 g/dL (ref 6.0–8.5)

## 2020-01-25 LAB — LIPID PANEL
Chol/HDL Ratio: 3.5 ratio (ref 0.0–5.0)
Cholesterol, Total: 156 mg/dL (ref 100–199)
HDL: 44 mg/dL (ref >39)
LDL Chol Calc (NIH): 83 mg/dL (ref 0–99)
Triglycerides: 166 mg/dL — ABNORMAL HIGH (ref 0–149)
VLDL Cholesterol Cal: 29 mg/dL (ref 5–40)

## 2020-01-31 ENCOUNTER — Other Ambulatory Visit: Payer: Self-pay

## 2020-01-31 ENCOUNTER — Ambulatory Visit (INDEPENDENT_AMBULATORY_CARE_PROVIDER_SITE_OTHER)
Admission: RE | Admit: 2020-01-31 | Discharge: 2020-01-31 | Disposition: A | Discharge status: Home / Self Care | Payer: 59 | Source: Ambulatory Visit | Attending: Physician Assistant | Admitting: Physician Assistant

## 2020-01-31 DIAGNOSIS — I712 Thoracic aortic aneurysm, without rupture, unspecified: Secondary | ICD-10-CM

## 2020-01-31 MED ORDER — IOHEXOL 350 MG/ML SOLN
100.0000 mL | Freq: Once | INTRAVENOUS | Status: AC | PRN
  Administered 2020-01-31: 14:00:00 100 mL via INTRAVENOUS

## 2020-02-02 ENCOUNTER — Other Ambulatory Visit: Payer: Self-pay | Admitting: *Deleted

## 2020-02-02 DIAGNOSIS — Z01812 Encounter for preprocedural laboratory examination: Secondary | ICD-10-CM

## 2020-02-02 DIAGNOSIS — I712 Thoracic aortic aneurysm, without rupture, unspecified: Secondary | ICD-10-CM

## 2020-03-18 ENCOUNTER — Ambulatory Visit: Payer: 59 | Attending: Internal Medicine

## 2020-03-18 DIAGNOSIS — Z23 Encounter for immunization: Secondary | ICD-10-CM

## 2020-03-18 NOTE — Progress Notes (Signed)
   Covid-19 Vaccination Clinic  Name:  [name]    MRN: [id number] DOB: January 30, 1966  03/18/2020  Mr. [name] was observed post Covid-19 immunization for 15 minutes without incident. He was provided with Vaccine Information Sheet and instruction to access the V-Safe system.   Mr. [name] was instructed to call 911 with any severe reactions post vaccine: Marland Kitchen Difficulty breathing  . Swelling of face and throat  . A fast heartbeat  . A bad rash all over body  . Dizziness and weakness   Immunizations Administered    Name Date Dose VIS Date Route   Pfizer COVID-19 Vaccine 03/18/2020 10:43 AM 0.3 mL 12/01/2019 Intramuscular   Manufacturer: [employer], [employer]   Lot: [id number]   NDC: [address]-3

## 2020-04-09 ENCOUNTER — Ambulatory Visit: Payer: 59 | Attending: Internal Medicine

## 2020-04-09 DIAGNOSIS — Z23 Encounter for immunization: Secondary | ICD-10-CM

## 2020-04-09 NOTE — Progress Notes (Signed)
   Covid-19 Vaccination Clinic  Name:  [name]    MRN: [id number] DOB: [birth year]  04/09/2020  [name] was observed post Covid-19 immunization for 15 minutes without incident. He was provided with Vaccine Information Sheet and instruction to access the V-Safe system.   [name] was instructed to call 911 with any severe reactions post vaccine: Marland Kitchen Difficulty breathing  . Swelling of face and throat  . A fast heartbeat  . A bad rash all over body  . Dizziness and weakness   Immunizations Administered    Name Date Dose VIS Date Route   Pfizer COVID-19 Vaccine 04/09/2020 12:26 PM 0.3 mL 02/14/2019 Intramuscular   Manufacturer: [employer], [employer]   Lot: [id number]   NDC: [address]-8

## 2020-04-23 ENCOUNTER — Telehealth: Payer: Self-pay | Admitting: Interventional Cardiology

## 2020-04-23 NOTE — Telephone Encounter (Signed)
Attempted to contact pt to discuss - call when to VM.  Left message to c/b. Recently in ED at [facility] for SOB with left sided chest pain.  EKG x 2 with no ischemia, lab "unremarkable" except D-Dimer of 742.  CT - No PE.

## 2020-04-23 NOTE — Telephone Encounter (Signed)
Pt c/o Shortness Of Breath: STAT if SOB developed within the last 24 hours or pt is noticeably SOB on the phone  1. Are you currently SOB (can you hear that pt is SOB on the phone)? Sort of  2. How long have you been experiencing SOB? About 10 minutes  3. Are you SOB when sitting or when up moving around? both  4. Are you currently experiencing any other symptoms? Nauseous and dark stool. Soreness in his chest that is continuous and in his back for about a week. 130/88 HR 120.

## 2020-04-23 NOTE — Telephone Encounter (Signed)
Patient changed his mind and asked to please call mobile number.

## 2020-04-23 NOTE — Telephone Encounter (Signed)
See prior call of further documentation.

## 2020-04-23 NOTE — Telephone Encounter (Signed)
Patient states he was in the ER for chest pain and SOB, he followed up with his PCP.  His PCP advised him to give [place] a call to let Dr. Eldridge Dace know what is going on, and if he should follow up with [place].

## 2020-04-23 NOTE — Telephone Encounter (Signed)
Pt c/o Shortness Of Breath: STAT if SOB developed within the last 24 hours or pt is noticeably SOB on the phone  1. Are you currently SOB (can you hear that pt is SOB on the phone)? Sort of  2. How long have you been experiencing SOB? About 10 minutes  3. Are you SOB when sitting or when up moving around? both  4. Are you currently experiencing any other symptoms? Nauseous and dark stool. Soreness in his chest that is continuous and in his back for about a week. 130/88 HR 120.    

## 2020-04-24 NOTE — Telephone Encounter (Signed)
I spoke with patient. He saw his PCP on Monday after recent ED visit. Patient reports PCP told him he should follow up with Dr Eldridge Dace for chest pain and shortness of breath. Patient reports he continues to have the same shortness of breath, chest pain and back pain he has been having.  I scheduled patient to see Dr Jim Like on May 10,2021 at 10:20 Patient states the nausea and dark stools started yesterday. He reports stools look black. Has bowel movement every 3-4 hours. I told patient to contact PCP with this information and schedule office visit for today. I told him if  he is not able to get visit today he should go to Urgent Care today for evaluation.

## 2020-04-24 NOTE — Telephone Encounter (Signed)
Left message to call office

## 2020-04-26 NOTE — Telephone Encounter (Signed)
He will need CBC at our visit if not done before Monday.  JV

## 2020-04-28 NOTE — Progress Notes (Signed)
Cardiology Office Note   Date:  04/29/2020   ID:  [name], DOB 1966/08/18, MRN [id number]  PCP:  Lawerance Cruel, MD    No chief complaint on file.  Bicuspid aortic valve  Wt Readings from Last 3 Encounters:  04/29/20 256 lb (116.1 kg)  01/22/20 247 lb (112 kg)  04/25/19 226 lb 3.1 oz (102.6 kg)       History of Present Illness: [name] is a 54 y.o. male   with a bicuspid aortic valve, and associated thoracic aortic aneurysm.   MRIin 2016showed that since 2013, his thoracic aortic aneurysm has increased from 4.3 cm to 4.5 cm.   He had some chest pain and was admitted to the hospital in 12/17. Cath showed: "The left ventricular systolic function is normal.  LV end diastolic pressure is normal.   Normal left ventricular function without focal segmental wall motion abnormality.  Ejection fraction is approximately 65%.  Normal coronary arteries in a right dominant coronary circulation."  More CP led to CT scan in early Jan 2018 and thoracic aneurysm was the same size.  His father passed away from CHF in 03-Apr-2023; he had an LVAD.   CT scan in 2018 showed saneurysm size of 4.5 cm.  He was in the ER for HTN in 10/2019.  BP was 180/110. Negative w/u.  BP better since spironolactone started.  At that time, he had trying to walk for exercise.  Over the years, he has had on and off chest discomfort.  It has been atypical and usually it is worse with emotional stress.  Walking would actually help calm him down and his pain will go away.  He had some more chest pain in April 2021.  He had a negative work-up.  He had another emergency room visit in May 2021 for melena, but there was no blood found in his stool.   Past Medical History:  Diagnosis Date  . ABDOMINAL PAIN-EPIGASTRIC   . COAGULOPATHY, COUMADIN-INDUCED   . DYSPHAGIA OROPHARYNGEAL PHASE   . Heartburn   . HYPERLIPIDEMIA   . HYPERTENSION   . HYPERTENSION NEC   . HYPOTHYROIDISM   . PE    . Primary hypercoagulable state ([place])   . Thoracic aneurysm without mention of rupture   . Thoracic aortic aneurysm ([place])   . VASCULITIS     Past Surgical History:  Procedure Laterality Date  . CARDIAC CATHETERIZATION N/A 12/08/2016   Procedure: Left Heart Cath and Coronary Angiography;  Surgeon: Troy Sine, MD;  Location: [place] [facility];  Service: Cardiovascular;  Laterality: N/A;  . ELBOW SURGERY Left 2010     Current Outpatient Medications  Medication Sig Dispense Refill  . amLODipine (NORVASC) 10 MG tablet TAKE 1 TABLET BY MOUTH EVERY DAY (Patient taking differently: Take 5 mg by mouth daily. ) 90 tablet 3  . aspirin 81 MG tablet Take 81 mg by mouth daily.    Marland Kitchen levothyroxine (SYNTHROID) 200 MCG tablet Take 200 mcg by mouth daily.    . metoprolol succinate (TOPROL-XL) 50 MG 24 hr tablet Take 1 tablet (50 mg total) by mouth daily. (Patient taking differently: Take 100 mg by mouth daily. ) 90 tablet 3  . potassium chloride SA (KLOR-CON) 20 MEQ tablet Take 20 mEq by mouth 3 (three) times daily.    . sertraline (ZOLOFT) 100 MG tablet Take 100 mg by mouth every morning.    Marland Kitchen spironolactone (ALDACTONE) 25 MG tablet Take 1 tablet (25  mg total) by mouth daily. 90 tablet 3  . valsartan (DIOVAN) 320 MG tablet Take 1 tablet (320 mg total) by mouth daily. 30 tablet 11   No current facility-administered medications for this visit.    Allergies:   Latex and Other    Social History:  The patient  reports that he has never smoked. He has never used smokeless tobacco. He reports that he does not drink alcohol or use drugs.   Family History:  The patient's family history includes Heart disease in his mother; Hypertension in his father.    ROS:  Please see the history of present illness.   Otherwise, review of systems are positive for left leg pain.   All other systems are reviewed and negative.    PHYSICAL EXAM: VS:  BP 112/84   Pulse 62   Ht 6\' 3"  (1.905 m)   Wt 256 lb (116.1  kg)   SpO2 97%   BMI 32.00 kg/m  , BMI Body mass index is 32 kg/m. GEN: Well nourished, well developed, in no acute distress  HEENT: normal  Neck: no JVD, carotid bruits, or masses Cardiac: RRR; no murmurs, rubs, or gallops  Respiratory:  clear to auscultation bilaterally, normal work of breathing GI: soft, nontender, nondistended, + BS MS: no deformity or atrophy ; left calf slightly increased in size Skin: warm and dry, no rash Neuro:  Strength and sensation are intact Psych: euthymic mood, full affect   EKG:   The ekg ordered today demonstrates NSR, inferior T wave inversions, no change from prior   Recent Labs: 01/24/2020: ALT 22; BUN 17; Creatinine, Ser 0.98; Potassium 4.4; Sodium 144   Lipid Panel    Component Value Date/Time   CHOL 156 01/24/2020 1131   TRIG 166 (H) 01/24/2020 1131   HDL 44 01/24/2020 1131   CHOLHDL 3.5 01/24/2020 1131   CHOLHDL 3.3 12/07/2016 0247   VLDL 10 12/07/2016 0247   LDLCALC 83 01/24/2020 1131     Other studies Reviewed: Additional studies/ records that were reviewed today with results demonstrating: Results from [facility] ER noted.  CT scan done at [facility] in April 2021 showed aorta diameter of 44 mm.  Negative troponin x 2. BNP 26 ( normal) in 4/21.   ASSESSMENT AND PLAN:  1. Bicuspid aortic valve:  No severe stenosis in 2020. Mild SHOB, chronic.  Continue trying to increase exercise to improve stamina.  2. Thoracic aortic aneurysm: 2020 CT showed 45 mm as greatest dimension of aorta.  Repeat scan in April 2021 showed 44 mm as the size of his ascending thoracic aortic aneurysm.  Continue to avoid straining.  Aggressive blood pressure control.  Chest pain has been recurrent.  I do not think it is related to his aneurysm.   3. Hypertension: Low-salt diet.  Regular exercise as well. 4. Obesity: Whole food, plant-based diet recommended.  We talked about avoiding processed foods. 5. Hbg 16.4 in 5/21 at [facility]. 6. Left leg pain: Prior PE in 2011.  Will check LE Dopplers. Mild change in leg size/swelling as well.    Current medicines are reviewed at length with the patient today.  The patient concerns regarding his medicines were addressed.  The following changes have been made:  No change  Labs/ tests ordered today include:  No orders of the defined types were placed in this encounter.   Recommend 150 minutes/week of aerobic exercise Low fat, low carb, high fiber diet recommended  Disposition:   FU  in 1 year   Signed, Lance Muss, MD  04/29/2020 10:43 AM    [facility] [address] [place], [place], [place]  [address] Phone: [phone]; Fax: [phone]

## 2020-04-29 ENCOUNTER — Ambulatory Visit (INDEPENDENT_AMBULATORY_CARE_PROVIDER_SITE_OTHER): Payer: 59 | Admitting: Interventional Cardiology

## 2020-04-29 ENCOUNTER — Other Ambulatory Visit: Payer: Self-pay

## 2020-04-29 ENCOUNTER — Encounter: Payer: Self-pay | Admitting: Interventional Cardiology

## 2020-04-29 VITALS — BP 112/84 | HR 62 | Ht 75.0 in | Wt 256.0 lb

## 2020-04-29 DIAGNOSIS — I712 Thoracic aortic aneurysm, without rupture, unspecified: Secondary | ICD-10-CM

## 2020-04-29 DIAGNOSIS — I10 Essential (primary) hypertension: Secondary | ICD-10-CM | POA: Diagnosis not present

## 2020-04-29 DIAGNOSIS — M79605 Pain in left leg: Secondary | ICD-10-CM

## 2020-04-29 DIAGNOSIS — R0602 Shortness of breath: Secondary | ICD-10-CM

## 2020-04-29 DIAGNOSIS — E785 Hyperlipidemia, unspecified: Secondary | ICD-10-CM

## 2020-04-29 DIAGNOSIS — M7989 Other specified soft tissue disorders: Secondary | ICD-10-CM

## 2020-04-29 DIAGNOSIS — Q231 Congenital insufficiency of aortic valve: Secondary | ICD-10-CM

## 2020-04-29 NOTE — Patient Instructions (Signed)
Medication Instructions:  Your physician recommends that you continue on your current medications as directed. Please refer to the Current Medication list given to you today.  *If you need a refill on your cardiac medications before your next appointment, please call your pharmacy*   Lab Work: None ordered  If you have labs (blood work) drawn today and your tests are completely normal, you will receive your results only by: Marland Kitchen MyChart Message (if you have MyChart) OR . A paper copy in the mail If you have any lab test that is abnormal or we need to change your treatment, we will call you to review the results.   Testing/Procedures: Your physician has requested that you have a lower extremity venous duplex. This test is an ultrasound of the veins in the legs. It looks at venous blood flow that carries blood from the heart to the legs. Allow one hour for a Lower Venous exam. There are no restrictions or special instructions.   Follow-Up: At [facility], you and your health needs are our priority.  As part of our continuing mission to provide you with exceptional heart care, we have created designated Provider Care Teams.  These Care Teams include your primary Cardiologist (physician) and Advanced Practice Providers (APPs -  Physician Assistants and Nurse Practitioners) who all work together to provide you with the care you need, when you need it.  We recommend signing up for the patient portal called "MyChart".  Sign up information is provided on this After Visit Summary.  MyChart is used to connect with patients for Virtual Visits (Telemedicine).  Patients are able to view lab/test results, encounter notes, upcoming appointments, etc.  Non-urgent messages can be sent to your provider as well.   To learn more about what you can do with MyChart, go to [email or web address].    Your next appointment:   12 month(s)  The format for your next appointment:   In Person  Provider:   You may  see Lance Muss, MD or one of the following Advanced Practice Providers on your designated Care Team:    Ronie Spies, PA-C  Jacolyn Reedy, PA-C    Other Instructions

## 2020-04-30 ENCOUNTER — Telehealth: Payer: Self-pay

## 2020-04-30 ENCOUNTER — Encounter (HOSPITAL_COMMUNITY): Payer: Self-pay

## 2020-04-30 ENCOUNTER — Ambulatory Visit (HOSPITAL_COMMUNITY)
Admission: RE | Admit: 2020-04-30 | Discharge: 2020-04-30 | Disposition: A | Discharge status: Home / Self Care | Payer: 59 | Source: Ambulatory Visit | Attending: Cardiology | Admitting: Cardiology

## 2020-04-30 DIAGNOSIS — M79605 Pain in left leg: Secondary | ICD-10-CM | POA: Diagnosis not present

## 2020-04-30 DIAGNOSIS — M7989 Other specified soft tissue disorders: Secondary | ICD-10-CM

## 2020-04-30 DIAGNOSIS — R0602 Shortness of breath: Secondary | ICD-10-CM | POA: Insufficient documentation

## 2020-04-30 MED ORDER — APIXABAN 5 MG PO TABS: 5.0000 mg | ORAL_TABLET | Freq: Two times a day (BID) | ORAL | 3 refills | Status: DC

## 2020-04-30 NOTE — Progress Notes (Signed)
Bilateral lower extremity venous duplex completed. Evidence of DVT in the left lower extremity, see results under Chart Review- CV proc. Spoke with [place], RN who reviewed instructions for patient with Dr. Eldridge Dace.

## 2020-04-30 NOTE — Telephone Encounter (Signed)
Vascular tech from [facility] calling to report that patient's LE Venous ultrasound that he had done today was positive for DVT in the left leg in the popliteal, gastrocnemius, and short saphenous veins. Made Dr. Eldridge Dace aware and we will have patient start eliquis 10 mg BID x 7 days and then starting on day 8, the patient will decrease to 5 mg BID per DVT protocol. Patient will need to stop ASA. Follow up scheduled with Dr. Eldridge Dace on 07/08/20 at 9:00 AM.   Samples of eliquis have been given to the patient along with instructions, 30 day free card, co-pay savings card, follow up appointment, and the need to report S/Sx of bleeding and to avoid NSAIDs while on Eliquis. Rx sent in for eliquis 5 mg BID since the patient will use his samples for the 10 mg BID dose for the first 7 days.

## 2020-07-07 NOTE — Progress Notes (Signed)
Cardiology Office Note   Date:  07/08/2020   ID:  [name], DOB August 10, 1966, MRN [id number]  PCP:  Daisy Floro, MD    No chief complaint on file.  DVT/PE  Wt Readings from Last 3 Encounters:  07/08/20 260 lb (117.9 kg)  04/29/20 256 lb (116.1 kg)  01/22/20 247 lb (112 kg)       History of Present Illness: [name] is a 54 y.o. male  with a bicuspid aortic valve, and associated thoracic aortic aneurysm.   MRIin 2016showed that since 2013, his thoracic aortic aneurysm has increased from 4.3 cm to 4.5 cm.   He had some chest pain and was admitted to the hospital in 12/17. Cath showed: "The left ventricular systolic function is normal.  LV end diastolic pressure is normal.  Normal left ventricular function without focal segmental wall motion abnormality. Ejection fraction is approximately 65%.  Normal coronary arteries in a right dominant coronary circulation."  More CP led to CT scan in early Jan 2018 and thoracic aneurysm was the same size.  His father passed away from CHF in 03-12-2023; he had an LVAD.   CT scan in 2018 showed saneurysm size of 4.5 cm.  He was in the ER for HTN in 10/2019. BP was 180/110. Negative w/u. BP better since spironolactone started.  At that time, he had trying to walk for exercise.  Over the years, he has had on and off chest discomfort.  It has been atypical and usually it is worse with emotional stress.  Walking would actually help calm him down and his pain will go away.  He had some more chest pain in April 2021.  He had a negative work-up.  He had another emergency room visit in May 2021 for melena, but there was no blood found in his stool.  Diagnosed with DVT in May 2021.  He was started on anticoagulation.  This was his second DVT/PE.  He had one in 2011.  Her has a family h/o blood clots. He saw Dr. Clelia Croft in 2011.  Warfarin was stopped at that time.   Past Medical History:  Diagnosis Date  .  ABDOMINAL PAIN-EPIGASTRIC   . COAGULOPATHY, COUMADIN-INDUCED   . DYSPHAGIA OROPHARYNGEAL PHASE   . Heartburn   . HYPERLIPIDEMIA   . HYPERTENSION   . HYPERTENSION NEC   . HYPOTHYROIDISM   . PE   . Primary hypercoagulable state ([facility])   . Thoracic aneurysm without mention of rupture   . Thoracic aortic aneurysm ([facility])   . VASCULITIS     Past Surgical History:  Procedure Laterality Date  . CARDIAC CATHETERIZATION N/A 12/08/2016   Procedure: Left Heart Cath and Coronary Angiography;  Surgeon: Lennette Bihari, MD;  Location: [facility] INV[facility]IVE CV LAB;  Service: Cardiovascular;  Laterality: N/A;  . ELBOW SURGERY Left 2010     Current Outpatient Medications  Medication Sig Dispense Refill  . amLODipine (NORVASC) 10 MG tablet TAKE 1 TABLET BY MOUTH EVERY DAY (Patient taking differently: Take 5 mg by mouth daily. ) 90 tablet 3  . apixaban (ELIQUIS) 5 MG TABS tablet Take 1 tablet (5 mg total) by mouth 2 (two) times daily. 60 tablet 3  . levothyroxine (SYNTHROID) 200 MCG tablet Take 200 mcg by mouth daily.    . metoprolol succinate (TOPROL-XL) 50 MG 24 hr tablet Take 1 tablet (50 mg total) by mouth daily. (Patient taking differently: Take 100 mg by mouth daily. ) 90 tablet 3  .  potassium chloride SA (KLOR-CON) 20 MEQ tablet Take 20 mEq by mouth 3 (three) times daily.    . sertraline (ZOLOFT) 100 MG tablet Take 100 mg by mouth every morning.    Marland Kitchen spironolactone (ALDACTONE) 25 MG tablet Take 1 tablet (25 mg total) by mouth daily. 90 tablet 3  . valsartan (DIOVAN) 320 MG tablet Take 1 tablet (320 mg total) by mouth daily. 30 tablet 11   No current facility-administered medications for this visit.    Allergies:   Latex and Other    Social History:  The patient  reports that he has never smoked. He has never used smokeless tobacco. He reports that he does not drink alcohol and does not use drugs.   Family History:  The patient's family history includes Heart disease in his mother; Hypertension in his  father.    ROS:  Please see the history of present illness.   Otherwise, review of systems are positive for improved swelling in his leg swelling.   All other systems are reviewed and negative.    PHYSICAL EXAM: VS:  BP 100/74   Pulse 93   Ht 6\' 3"  (1.905 m)   Wt 260 lb (117.9 kg)   SpO2 93%   BMI 32.50 kg/m  , BMI Body mass index is 32.5 kg/m. GEN: Well nourished, well developed, in no acute distress  HEENT: normal  Neck: no JVD, carotid bruits, or masses Cardiac: RRR; no murmurs, rubs, or gallops,no edema  Respiratory:  clear to auscultation bilaterally, normal work of breathing GI: soft, nontender, nondistended, + BS MS: no deformity or atrophy  Skin: warm and dry, no rash Neuro:  Strength and sensation are intact Psych: euthymic mood, full affect   Recent Labs: 01/24/2020: ALT 22; BUN 17; Creatinine, Ser 0.98; Potassium 4.4; Sodium 144   Lipid Panel    Component Value Date/Time   CHOL 156 01/24/2020 1131   TRIG 166 (H) 01/24/2020 1131   HDL 44 01/24/2020 1131   CHOLHDL 3.5 01/24/2020 1131   CHOLHDL 3.3 12/07/2016 0247   VLDL 10 12/07/2016 0247   LDLCALC 83 01/24/2020 1131     Other studies Reviewed: Additional studies/ records that were reviewed today with results demonstrating: May 2021 venous Doppler showed DVT.June 2021   ASSESSMENT AND PLAN:  1.   Bicuspid aortic valve: No CHF.   Stable.  3. Thoracic aneurysm: Annual imaging. Next one in 2/22. 4. DVT: Started on Eliquis, continue indefinitely given recurrent DVT/PE.  Only mild difference in sizes of legs at the time of diagnosis in May 2021.  Check BMet and CBC at next check with Dr. June 2021.  He will get colonoscopy in August, and may need to be off of Eliquis. 5. HTN: The current medical regimen is effective;  continue present plan and medications. 6. Obesity: Whole food, plant-based diet recommended.   Current medicines are reviewed at length with the patient today.  The patient concerns regarding his medicines  were addressed.  The following changes have been made:  No change  Labs/ tests ordered today include:  No orders of the defined types were placed in this encounter.   Recommend 150 minutes/week of aerobic exercise Low fat, low carb, high fiber diet recommended  Disposition:   FU in March 2022, after CT scan   Signed, 04-27-1973, MD  07/08/2020 9:19 AM    [facility] [address] [place], [place], [place]  [place] Phone: [phone]; Fax: [phone]

## 2020-07-08 ENCOUNTER — Ambulatory Visit (INDEPENDENT_AMBULATORY_CARE_PROVIDER_SITE_OTHER): Payer: No Typology Code available for payment source | Admitting: Interventional Cardiology

## 2020-07-08 ENCOUNTER — Other Ambulatory Visit: Payer: Self-pay

## 2020-07-08 ENCOUNTER — Encounter: Payer: Self-pay | Admitting: Interventional Cardiology

## 2020-07-08 VITALS — BP 100/74 | HR 93 | Ht 75.0 in | Wt 260.0 lb

## 2020-07-08 DIAGNOSIS — I10 Essential (primary) hypertension: Secondary | ICD-10-CM

## 2020-07-08 DIAGNOSIS — I712 Thoracic aortic aneurysm, without rupture, unspecified: Secondary | ICD-10-CM

## 2020-07-08 DIAGNOSIS — Q231 Congenital insufficiency of aortic valve: Secondary | ICD-10-CM | POA: Diagnosis not present

## 2020-07-08 DIAGNOSIS — I82432 Acute embolism and thrombosis of left popliteal vein: Secondary | ICD-10-CM | POA: Diagnosis not present

## 2020-07-08 NOTE — Patient Instructions (Signed)
Medication Instructions:  Your physician recommends that you continue on your current medications as directed. Please refer to the Current Medication list given to you today.  *If you need a refill on your cardiac medications before your next appointment, please call your pharmacy*   Lab Work: Please have a CBC (complete blood count) drawn at your PCP's office and have them fax the results to [place].   If you have labs (blood work) drawn today and your tests are completely normal, you will receive your results only by: Marland Kitchen MyChart Message (if you have MyChart) OR . A paper copy in the mail If you have any lab test that is abnormal or we need to change your treatment, we will call you to review the results.   Testing/Procedures: None   Follow-Up: At [facility], you and your health needs are our priority.  As part of our continuing mission to provide you with exceptional heart care, we have created designated Provider Care Teams.  These Care Teams include your primary Cardiologist (physician) and Advanced Practice Providers (APPs -  Physician Assistants and Nurse Practitioners) who all work together to provide you with the care you need, when you need it.  We recommend signing up for the patient portal called "MyChart".  Sign up information is provided on this After Visit Summary.  MyChart is used to connect with patients for Virtual Visits (Telemedicine).  Patients are able to view lab/test results, encounter notes, upcoming appointments, etc.  Non-urgent messages can be sent to your provider as well.   To learn more about what you can do with MyChart, go to [email or web address].    Your next appointment:   7 month(s)  The format for your next appointment:   In Person  Provider:   You may see Lance Muss, MD or one of the following Advanced Practice Providers on your designated Care Team:    Ronie Spies, PA-C  Jacolyn Reedy, PA-C    Other Instructions None

## 2020-07-24 ENCOUNTER — Telehealth: Payer: Self-pay | Admitting: Interventional Cardiology

## 2020-07-24 NOTE — Telephone Encounter (Signed)
Vernona Rieger with [facility] is requesting a diagnosis code for CBC lab order. She states a call may be returned to the patient to provide code.

## 2020-08-06 ENCOUNTER — Telehealth: Payer: Self-pay | Admitting: *Deleted

## 2020-08-06 NOTE — Telephone Encounter (Signed)
   [facility] Pre-operative Risk Assessment    HEARTCARE STAFF: - Please ensure there is not already an duplicate clearance open for this procedure. - Under Visit Info/Reason for Call, type in Other and utilize the format Clearance MM/DD/YY or Clearance TBD. Do not use dashes or single digits. - If request is for dental extraction, please clarify the # of teeth to be extracted.  Request for surgical clearance:  1. What type of surgery is being performed? SCREENING COLONOSCOPY   2. When is this surgery scheduled? TBD   3. What type of clearance is required (medical clearance vs. Pharmacy clearance to hold med vs. Both)? BOTH  4. Are there any medications that need to be held prior to surgery and how long? ELIQUIS x 2-3 DAYS PRIOR TO PROCEDURE   5. Practice name and name of physician performing surgery? [facility]; DR. Gwyndolyn Saxon [facility]   6. What is the office phone number? [phone]   7.   What is the office fax number? [phone]  8.   Anesthesia type (None, local, MAC, general) ? NOT LISTED   Julaine Hua 08/06/2020, 2:03 PM  _________________________________________________________________   (provider comments below)

## 2020-08-07 NOTE — Telephone Encounter (Signed)
Called the requesting office of [facility] and spoke with April. I informed her of the recommendation of the Clinical Pharmacist and Dr. Eldridge Dace that the patient needs to wait until 10/31/20 or later to have the procedure due to recent DVT history and patient would be high risk. She thanked me for calling and asked that the information be faxed to their office on the form. I let her know that once we receive a request we enter it into our system. She asked if it will be in Epic and I stated yes she said she will see it that way.

## 2020-08-07 NOTE — Telephone Encounter (Signed)
I agree this would be high risk.  If it is just a screening colonoscopy, would prefer to wait at least 6 months after DVT.   JV

## 2020-08-07 NOTE — Telephone Encounter (Signed)
Patient with diagnosis of DVT x2 on Eliquis for anticoagulation.  Last DVT 04/30/2020  Procedure: screening colonoscopy Date of procedure: TBD  CrCl 123 mL/min  Due to patient's history of DVT 3 months ago patient would be high risk off anticoagulation.  Will route to MD for input

## 2020-08-07 NOTE — Telephone Encounter (Signed)
Please contact patient.  Patient is at high risk for DVT.  He will need to wait on his colonoscopy until 10/31/2020 or later.  He is not cleared for his procedure at this time.

## 2020-08-07 NOTE — Telephone Encounter (Signed)
Called patient and made him aware of the recommendations per Dr. Eldridge Dace to wait at least 6 months or later after his recent DVT and that the 6 months would 10/31/20 or later. Pateint verbalized understanding and thanked me for calling. Patient also made aware that the office of Dr. Dulce Sellar was made aware of these recommendations.

## 2020-09-20 ENCOUNTER — Other Ambulatory Visit: Payer: Self-pay | Admitting: Interventional Cardiology

## 2020-09-20 NOTE — Telephone Encounter (Signed)
Prescription refill request for Eliquis received.  Last office visit: Varanasi,07/24/2020 Scr: 0.98, 01/24/2020 Age: 54 y.o. Weight:117.9 kg   Prescription refill sent.

## 2020-10-04 ENCOUNTER — Telehealth: Payer: Self-pay | Admitting: *Deleted

## 2020-10-04 ENCOUNTER — Encounter: Payer: Self-pay | Admitting: *Deleted

## 2020-10-04 NOTE — Telephone Encounter (Signed)
Clinical pharmacist to review Eliquis 

## 2020-10-04 NOTE — Telephone Encounter (Signed)
° °  [facility] Pre-operative Risk Assessment    HEARTCARE STAFF: - Please ensure there is not already an duplicate clearance open for this procedure. - Under Visit Info/Reason for Call, type in Other and utilize the format Clearance MM/DD/YY or Clearance TBD. Do not use dashes or single digits. - If request is for dental extraction, please clarify the # of teeth to be extracted.  Request for surgical clearance:  1. What type of surgery is being performed? SCREENING COLONOSCOPY   2. When is this surgery scheduled? 11/20/20   3. What type of clearance is required (medical clearance vs. Pharmacy clearance to hold med vs. Both)? BOTH  4. Are there any medications that need to be held prior to surgery and how long? ELIQUIS x 2-3 DAYS PRIOR TO PROCEDURE   5. Practice name and name of physician performing surgery? [facility]; DR. Gwyndolyn Saxon [place]   6. What is the office phone number? [phone]   7.   What is the office fax number? [phone]  8.   Anesthesia type (None, local, MAC, general) ? NOT LISTED   [name] 10/04/2020, 3:26 PM  _________________________________________________________________   (provider comments below)

## 2020-10-04 NOTE — Telephone Encounter (Signed)
Patient with diagnosis of DVT on Eliquis for anticoagulation.    Procedure:  SCREENING COLONOSCOPY  Date of procedure: 11/20/20      CrCl 122 ml/min   Due to patients hx of multiple DVT (last DVT will be >6 month from date of procedure) I would recommned patient hold Eliquis only 1 day prior to procedure. However MD is requesting 2 days.  I will defer to Dr. Eldridge Dace.

## 2020-10-04 NOTE — Telephone Encounter (Signed)
This encounter was created in error - please disregard.

## 2020-10-04 NOTE — Telephone Encounter (Signed)
Our office received another clearance request for colonoscopy set for 11/20/20. I faxed notes today to requesting office the pt.

## 2020-10-06 NOTE — Telephone Encounter (Signed)
OK to hold Eliquis 2 days prior.   JV

## 2020-10-07 NOTE — Telephone Encounter (Signed)
Left VM

## 2020-10-14 NOTE — Telephone Encounter (Signed)
   Primary Cardiologist: Lance Muss, MD  Chart reviewed as part of pre-operative protocol coverage. I called patient and he denied any signifficant change in cardiac symptoms. Denied chest pain, shortness of breath, lower leg swelling, or orthopnea. He has palpitations but this is not new. Given past medical history and time since last visit, based on ACC/AHA guidelines, COYE DAWOOD would be at acceptable risk for the planned procedure without further cardiovascular testing.   Per Dr. Eldridge Dace recommendation it is okay to hold Eliquis 2 days prior to procedure.   The patient was advised that if he develops new symptoms prior to surgery to contact our office to arrange for a follow-up visit, and he verbalized understanding.  I will route this recommendation to the requesting party via Epic fax function and remove from pre-op pool.  Please call with questions.  Tobey Schmelzle David Stall, PA-C 10/14/2020, 1:31 PM

## 2020-10-15 ENCOUNTER — Other Ambulatory Visit: Payer: Self-pay | Admitting: Interventional Cardiology

## 2020-11-05 ENCOUNTER — Emergency Department (HOSPITAL_COMMUNITY): Payer: Self-pay

## 2020-11-05 ENCOUNTER — Other Ambulatory Visit: Payer: Self-pay

## 2020-11-05 ENCOUNTER — Encounter (HOSPITAL_COMMUNITY): Payer: Self-pay

## 2020-11-05 ENCOUNTER — Emergency Department (HOSPITAL_COMMUNITY)
Admission: EM | Admit: 2020-11-05 | Discharge: 2020-11-05 | Disposition: A | Discharge status: Home / Self Care | Payer: Self-pay | Attending: Emergency Medicine | Admitting: Emergency Medicine

## 2020-11-05 DIAGNOSIS — R0789 Other chest pain: Secondary | ICD-10-CM | POA: Insufficient documentation

## 2020-11-05 DIAGNOSIS — R079 Chest pain, unspecified: Secondary | ICD-10-CM

## 2020-11-05 DIAGNOSIS — Z955 Presence of coronary angioplasty implant and graft: Secondary | ICD-10-CM | POA: Insufficient documentation

## 2020-11-05 DIAGNOSIS — E039 Hypothyroidism, unspecified: Secondary | ICD-10-CM | POA: Insufficient documentation

## 2020-11-05 DIAGNOSIS — Z7901 Long term (current) use of anticoagulants: Secondary | ICD-10-CM | POA: Insufficient documentation

## 2020-11-05 DIAGNOSIS — I10 Essential (primary) hypertension: Secondary | ICD-10-CM | POA: Insufficient documentation

## 2020-11-05 DIAGNOSIS — Z87891 Personal history of nicotine dependence: Secondary | ICD-10-CM | POA: Insufficient documentation

## 2020-11-05 DIAGNOSIS — Z91040 Latex allergy status: Secondary | ICD-10-CM | POA: Insufficient documentation

## 2020-11-05 LAB — COMPREHENSIVE METABOLIC PANEL
ALT: 23 U/L (ref 0–44)
AST: 27 U/L (ref 15–41)
Albumin: 3.9 g/dL (ref 3.5–5.0)
Alkaline Phosphatase: 52 U/L (ref 38–126)
Anion gap: 10 (ref 5–15)
BUN: 19 mg/dL (ref 6–20)
CO2: 19 mmol/L — ABNORMAL LOW (ref 22–32)
Calcium: 9.2 mg/dL (ref 8.9–10.3)
Chloride: 110 mmol/L (ref 98–111)
Creatinine, Ser: 1.14 mg/dL (ref 0.61–1.24)
GFR, Estimated: >60 mL/min (ref >60)
Glucose, Bld: 107 mg/dL — ABNORMAL HIGH (ref 70–99)
Potassium: 4.2 mmol/L (ref 3.5–5.1)
Sodium: 139 mmol/L (ref 135–145)
Total Bilirubin: 0.5 mg/dL (ref 0.3–1.2)
Total Protein: 7.2 g/dL (ref 6.5–8.1)

## 2020-11-05 LAB — URINALYSIS, ROUTINE W REFLEX MICROSCOPIC
Bacteria, UA: NONE SEEN
Bilirubin Urine: NEGATIVE
Glucose, UA: NEGATIVE mg/dL
Ketones, ur: NEGATIVE mg/dL
Leukocytes,Ua: NEGATIVE
Nitrite: NEGATIVE
Protein, ur: NEGATIVE mg/dL
Specific Gravity, Urine: 1.021 (ref 1.005–1.030)
pH: 5 (ref 5.0–8.0)

## 2020-11-05 LAB — CBC
HCT: 48.7 % (ref 39.0–52.0)
Hemoglobin: 16.7 g/dL (ref 13.0–17.0)
MCH: 33.6 pg (ref 26.0–34.0)
MCHC: 34.3 g/dL (ref 30.0–36.0)
MCV: 98 fL (ref 80.0–100.0)
Platelets: 271 10*3/uL (ref 150–400)
RBC: 4.97 MIL/uL (ref 4.22–5.81)
RDW: 12.9 % (ref 11.5–15.5)
WBC: 6.2 10*3/uL (ref 4.0–10.5)
nRBC: 0 % (ref 0.0–0.2)

## 2020-11-05 LAB — TROPONIN I (HIGH SENSITIVITY)
Troponin I (High Sensitivity): 5 ng/L (ref <18)
Troponin I (High Sensitivity): 5 ng/L (ref <18)

## 2020-11-05 LAB — LIPASE, BLOOD: Lipase: 35 U/L (ref 11–51)

## 2020-11-05 MED ORDER — ONDANSETRON HCL 4 MG/2ML IJ SOLN
4.0000 mg | Freq: Once | INTRAMUSCULAR | Status: AC
  Administered 2020-11-05: 4 mg via INTRAVENOUS
  Filled 2020-11-05: qty 2

## 2020-11-05 MED ORDER — MORPHINE SULFATE (PF) 4 MG/ML IV SOLN
4.0000 mg | Freq: Once | INTRAVENOUS | Status: AC
  Administered 2020-11-05: 4 mg via INTRAVENOUS
  Filled 2020-11-05: qty 1

## 2020-11-05 MED ORDER — IOHEXOL 350 MG/ML SOLN
100.0000 mL | Freq: Once | INTRAVENOUS | Status: AC | PRN
  Administered 2020-11-05: 100 mL via INTRAVENOUS

## 2020-11-05 NOTE — ED Triage Notes (Signed)
Pt bib ems for left sided chest pain that radiated to his left flank while getting his teeth cleaned at the dentist. Pain occurs twice a week for the past 2 years, pt being seen by multiple doctors, hx of AAA but stable. Pain worse with movement and palpation but improved with nitro. 2Nitro given en route, pt taking eliquis. Pt a.o, nad noted

## 2020-11-05 NOTE — ED Provider Notes (Signed)
[facility] EMERGENCY DEPARTMENT Provider Note   CSN: [id number] Arrival date & time: 11/05/20  1647     History Chief Complaint  Patient presents with  . Chest Pain    [name] is a 54 y.o. male.  [name] is a 54 y.o. male with history of a thoracic aortic aneurysm, hypertension, hyperlipidemia, dysphagia, bicuspid aortic valve, who presents to the emergency department via EMS for evaluation of chest pain.  He reports that this episode of chest pain started today while he was at the dentist getting his teeth cleaned.  He states it started in the center of his chest and he described this as a throbbing pain that would intermittently radiate to his right flank and to his left shoulder.  He states he has had similar pains before, most recently a few weeks ago.  Reports pain is worse with exertion and movement, denies pleuritic pain but did report some associated shortness of breath when episode began.  No lightheadedness or syncope.  No lower extremity swelling.  Patient was diagnosed with a DVT in his left lower leg about 6 months ago, but was put on Eliquis and has been taking this regularly.  He has a known aortic aneurysm and states he last had this checked in March and it was unchanged.  States he has been evaluated for similar chest pains multiple times previously without clear etiology.  Was last in the ED for chest pain in May he says.  He states that sometimes pain is worse with palpation, he denies any associated fevers or cough.  No other aggravating or alleviating factors.  Patient is followed by Dr. Eldridge Dace with cardiology.        Past Medical History:  Diagnosis Date  . ABDOMINAL PAIN-EPIGASTRIC   . COAGULOPATHY, COUMADIN-INDUCED   . DYSPHAGIA OROPHARYNGEAL PHASE   . Heartburn   . HYPERLIPIDEMIA   . HYPERTENSION   . HYPERTENSION NEC   . HYPOTHYROIDISM   . PE   . Primary hypercoagulable state (HCC)   . Thoracic aneurysm without  mention of rupture   . Thoracic aortic aneurysm (HCC)   . VASCULITIS     Patient Active Problem List   Diagnosis Date Noted  . Abnormal nuclear stress test   . Chest pain 12/05/2016  . Bicuspid aortic valve 10/17/2013  . PE 06/16/2010  . Hemorrhagic disorder due to intrinsic circulating anticoagulants (HCC) 04/07/2010  . Primary hypercoagulable state (HCC) 04/07/2010  . CHEST PAIN UNSPECIFIED 04/07/2010  . VASCULITIS 01/30/2010  . Nonspecific (abnormal) findings on radiological and other examination of body structure 01/15/2010  . CT, CHEST, ABNORMAL 01/15/2010  . Aneurysm of thoracic aorta (HCC) 01/14/2010  . Heartburn 01/14/2010  . DYSPHAGIA 01/14/2010  . ABDOMINAL PAIN-EPIGASTRIC 01/14/2010  . Hypothyroidism 01/06/2010  . Hyperlipidemia 01/06/2010  . Essential hypertension 01/06/2010  . LOSS OF WEIGHT 01/06/2010  . DYSPHAGIA OROPHARYNGEAL PHASE 01/06/2010  . DYSPHAGIA UNSPECIFIED 12/31/2009  . HYPERTENSION NEC 12/31/2009    Past Surgical History:  Procedure Laterality Date  . CARDIAC CATHETERIZATION N/A 12/08/2016   Procedure: Left Heart Cath and Coronary Angiography;  Surgeon: Lennette Bihari, MD;  Location: [facility] INVASIVE CV LAB;  Service: Cardiovascular;  Laterality: N/A;  . ELBOW SURGERY Left 2010       Family History  Problem Relation Age of Onset  . Heart disease Mother   . Hypertension Father     Social History   Tobacco Use  . Smoking status: Never Smoker  .  Smokeless tobacco: Never Used  Substance Use Topics  . Alcohol use: No  . Drug use: No    Home Medications Prior to Admission medications   Medication Sig Start Date End Date Taking? Authorizing Provider  amLODipine (NORVASC) 10 MG tablet TAKE 1 TABLET BY MOUTH EVERY DAY Patient taking differently: Take 5 mg by mouth daily.  05/17/19   Dyann Kief, PA-C  ELIQUIS 5 MG TABS tablet TAKE 1 TABLET BY MOUTH TWICE A DAY 09/20/20   Corky Crafts, MD  levothyroxine (SYNTHROID) 200 MCG tablet  Take 200 mcg by mouth daily. 04/23/20   [provider]  metoprolol succinate (TOPROL-XL) 50 MG 24 hr tablet Take 1 tablet (50 mg total) by mouth daily. Patient taking differently: Take 100 mg by mouth daily.  04/20/17   Corky Crafts, MD  potassium chloride SA (KLOR-CON) 20 MEQ tablet Take 20 mEq by mouth 3 (three) times daily.    [provider]  sertraline (ZOLOFT) 100 MG tablet Take 100 mg by mouth every morning. 10/30/19   [provider]  spironolactone (ALDACTONE) 25 MG tablet TAKE 1 TABLET BY MOUTH EVERY DAY 10/17/20   Corky Crafts, MD  valsartan (DIOVAN) 320 MG tablet Take 1 tablet (320 mg total) by mouth daily. 01/25/20   Corky Crafts, MD    Allergies    Latex and Other  Review of Systems   Review of Systems  Constitutional: Negative for chills and fever.  HENT: Negative.   Respiratory: Positive for shortness of breath.   Cardiovascular: Positive for chest pain. Negative for palpitations and leg swelling.  Gastrointestinal: Negative for abdominal pain, nausea and vomiting.  Genitourinary: Positive for flank pain.  Musculoskeletal: Negative for arthralgias and myalgias.  Skin: Negative for color change and rash.  Neurological: Negative for dizziness, syncope and light-headedness.  All other systems reviewed and are negative.   Physical Exam Updated Vital Signs BP 140/89 (BP Location: Left Arm)   Pulse 82   Temp 98.3 F (36.8 C) (Oral)   Resp 18   SpO2 98%   Physical Exam Vitals and nursing note reviewed.  Constitutional:      General: He is not in acute distress.    Appearance: He is well-developed. He is not diaphoretic.     Comments: Well-appearing.  No acute distress  HENT:     Head: Normocephalic and atraumatic.  Eyes:     General:        Right eye: No discharge.        Left eye: No discharge.     Pupils: Pupils are equal, round, and reactive to light.  Cardiovascular:     Rate and Rhythm: Normal rate and regular  rhythm.     Pulses:          Radial pulses are 2+ on the right side and 2+ on the left side.       Dorsalis pedis pulses are 2+ on the right side and 2+ on the left side.     Heart sounds: Normal heart sounds. No murmur heard.  No friction rub. No gallop.   Pulmonary:     Effort: Pulmonary effort is normal. No respiratory distress.     Breath sounds: Normal breath sounds. No wheezing or rales.     Comments: Respirations equal and unlabored, patient able to speak in full sentences, lungs clear to auscultation bilaterally Chest:     Chest wall: Tenderness present.     Comments: Tenderness over the  lower sternum that patient reports reproduces his chest pain Abdominal:     General: Bowel sounds are normal. There is no distension.     Palpations: Abdomen is soft. There is no mass.     Tenderness: There is no abdominal tenderness. There is no guarding.  Musculoskeletal:        General: No deformity.     Cervical back: Neck supple.     Right lower leg: No tenderness. No edema.     Left lower leg: No tenderness. No edema.  Skin:    General: Skin is warm and dry.     Capillary Refill: Capillary refill takes less than 2 seconds.     Findings: No rash.  Neurological:     Mental Status: He is alert.     Coordination: Coordination normal.     Comments: Speech is clear, able to follow commands Moves extremities without ataxia, coordination intact  Psychiatric:        Mood and Affect: Mood normal.        Behavior: Behavior normal.     ED Results / Procedures / Treatments   Labs (all labs ordered are listed, but only abnormal results are displayed) Labs Reviewed  COMPREHENSIVE METABOLIC PANEL - Abnormal; Notable for the following components:      Result Value   CO2 19 (*)    Glucose, Bld 107 (*)    All other components within normal limits  URINALYSIS, ROUTINE W REFLEX MICROSCOPIC - Abnormal; Notable for the following components:   Hgb urine dipstick MODERATE (*)    All other  components within normal limits  LIPASE, BLOOD  CBC  TROPONIN I (HIGH SENSITIVITY)  TROPONIN I (HIGH SENSITIVITY)    EKG EKG Interpretation  Date/Time:  Tuesday November 05 2020 16:52:06 EST Ventricular Rate:  91 PR Interval:  146 QRS Duration: 86 QT Interval:  348 QTC Calculation: 428 R Axis:   32 Text Interpretation: Normal sinus rhythm Possible Anterior infarct , age undetermined Abnormal ECG Confirmed by Tilden Fossa [phone]) on 11/05/2020 8:30:04 PM   Radiology DG Chest 2 View  Result Date: 11/05/2020 CLINICAL DATA:  54 year old male with chest pain. EXAM: CHEST - 2 VIEW COMPARISON:  Chest radiograph dated 04/15/2020. FINDINGS: The lungs are clear. There is no pleural effusion pneumothorax. The cardiac silhouette is within limits. Atherosclerotic calcification of the aortic arch. No acute osseous pathology. IMPRESSION: No active cardiopulmonary disease. Electronically Signed   By: Elgie Collard M.D.   On: 11/05/2020 17:22    Procedures Procedures (including critical care time)  Medications Ordered in ED Medications  ondansetron (ZOFRAN) injection 4 mg (4 mg Intravenous Given 11/05/20 2157)  morphine 4 MG/ML injection 4 mg (4 mg Intravenous Given 11/05/20 2156)  iohexol (OMNIPAQUE) 350 MG/ML injection 100 mL (100 mLs Intravenous Contrast Given 11/05/20 2112)    ED Course  I have reviewed the triage vital signs and the nursing notes.  Pertinent labs & imaging results that were available during my care of the patient were reviewed by me and considered in my medical decision making (see chart for details).    MDM Rules/Calculators/A&P                          Patient presents to the emergency department with chest pain. Patient nontoxic appearing, in no apparent distress, vitals without significant abnormality. Fairly benign physical exam.  Patient does have some tenderness over the chest.  Equal pulses bilaterally.  Prior heart  catheterization reviewed: Left  heart cath in 2017 with normal coronaries and EF of 65% Patient's primary cardiologist: Dr. [name]  DDX: ACS, pulmonary embolism, dissection, pneumothorax, pneumonia, arrhythmia, severe anemia, MSK, GERD, anxiety. Evaluation initiated with labs, EKG, and CXR. Patient on cardiac monitor.   CBC: No leukocytosis, normal hemoglobin CMP: No significant electrolyte derangements, normal renal and liver function Troponin: Negative x2 Lipase: WNL UA: Hemoglobin noted in urine but no RBCs, or signs of infection EKG: Normal sinus rhythm with some ST changes CXR: Negative, without infiltrate, effusion, pneumothorax, or fracture/dislocation.   Given known thoracic aortic aneurysm with pain, will get CT dissection study.  Patient also has known history of DVT and PE, but reports he is compliant on Eliquis.  Thus far patient's work-up has been reassuring, CT dissection study pending at shift change, care signed out to Dr. Madilyn Hookees who will follow up on CT and disposition appropriately.  Final Clinical Impression(s) / ED Diagnoses Final diagnoses:  Central chest pain    Rx / DC Orders ED Discharge Orders    None       Legrand RamsFord, Carvin Almas N, PA-C 11/05/20 2206    Tilden Fossaees, Elizabeth, MD 11/05/20 2246

## 2020-11-05 NOTE — ED Notes (Signed)
Pt transported to CT ?

## 2021-03-24 ENCOUNTER — Other Ambulatory Visit: Payer: Self-pay | Admitting: Interventional Cardiology

## 2021-03-24 NOTE — Telephone Encounter (Signed)
Pt's age 55, wt 117.9 kg, SCr 1.14, CrCl 126.4, last ov w/ JV 07/08/20.

## 2021-04-21 ENCOUNTER — Other Ambulatory Visit: Payer: Self-pay | Admitting: Interventional Cardiology

## 2021-07-14 ENCOUNTER — Other Ambulatory Visit: Payer: Self-pay | Admitting: Interventional Cardiology

## 2021-08-09 ENCOUNTER — Other Ambulatory Visit: Payer: Self-pay | Admitting: Interventional Cardiology

## 2021-08-15 ENCOUNTER — Telehealth: Payer: Self-pay | Admitting: Interventional Cardiology

## 2021-08-15 DIAGNOSIS — I712 Thoracic aortic aneurysm, without rupture, unspecified: Secondary | ICD-10-CM

## 2021-08-15 NOTE — Telephone Encounter (Signed)
Pt c/o BP issue: STAT if pt c/o blurred vision, one-sided weakness or slurred speech  1. What are your last 5 BP readings? 126/78 HR 112  2. Are you having any other symptoms (ex. Dizziness, headache, blurred vision, passed out)? Headache, stomach ache, light headness and extremely tired  3. What is your BP issue? High

## 2021-08-15 NOTE — Telephone Encounter (Signed)
Left a message for the pt to call back.  

## 2021-08-16 ENCOUNTER — Other Ambulatory Visit: Payer: Self-pay | Admitting: Interventional Cardiology

## 2021-08-20 ENCOUNTER — Other Ambulatory Visit: Payer: Self-pay

## 2021-08-20 MED ORDER — METOPROLOL SUCCINATE ER 50 MG PO TB24
50.0000 mg | ORAL_TABLET | Freq: Every day | ORAL | 1 refills | Status: DC
Start: 2021-08-20 — End: 2022-03-16

## 2021-08-20 MED ORDER — VALSARTAN 320 MG PO TABS: 320.0000 mg | ORAL_TABLET | Freq: Every day | ORAL | 1 refills | Status: DC

## 2021-08-20 MED ORDER — AMLODIPINE BESYLATE 10 MG PO TABS
10.0000 mg | ORAL_TABLET | Freq: Every day | ORAL | 1 refills | Status: DC
Start: 2021-08-20 — End: 2022-04-09

## 2021-08-20 MED ORDER — SPIRONOLACTONE 25 MG PO TABS: 25.0000 mg | ORAL_TABLET | Freq: Every day | ORAL | 1 refills | Status: DC

## 2021-08-20 MED ORDER — APIXABAN 5 MG PO TABS
5.0000 mg | ORAL_TABLET | Freq: Two times a day (BID) | ORAL | 2 refills | Status: DC
Start: 2021-08-20 — End: 2022-02-23

## 2021-08-20 NOTE — Telephone Encounter (Signed)
OK to fill meds for 90 days.  He needs a f/u CTA of the thoracic aorta in 10/2021.  It was stable in 11/21.

## 2021-08-20 NOTE — Telephone Encounter (Signed)
Called the pt to follow up on a call from prior ot the weekend and he reports that he is feeling much better.   He asks of he can have more than 14 day supply on his heart meds... I advised that he is well overdue for follow up with Dr. Eldridge Dace so I made him an appt for his next available in 12/2021.   Pt has AAA will forward to Dr. Eldridge Dace and his nurse to see if he would like any testing prior to that appt.   Meds refilled.

## 2021-08-20 NOTE — Telephone Encounter (Signed)
Pt advised and will forward to [facility] to follow up on the CT for 10/2021.

## 2021-08-21 NOTE — Telephone Encounter (Signed)
I spoke with patient. He has [place].  Will plan on doing CT in our office.  Patient aware he will be contacted closer to November to schedule

## 2021-08-27 ENCOUNTER — Telehealth: Payer: Self-pay | Admitting: *Deleted

## 2021-08-27 DIAGNOSIS — I712 Thoracic aortic aneurysm, without rupture, unspecified: Secondary | ICD-10-CM

## 2021-08-27 DIAGNOSIS — Z0189 Encounter for other specified special examinations: Secondary | ICD-10-CM

## 2021-08-27 NOTE — Telephone Encounter (Signed)
Lab order for BMET placed per CT protocol and as requested by CT Dept.  Will make our CT dept aware lab order placed, so they can arrange lab appt along with CT appt.

## 2021-08-27 NOTE — Telephone Encounter (Signed)
-----   Message from Fabiola Backer, [place] sent at 08/27/2021 10:04 AM EDT ----- Regarding: BMET Needed Hi,   Can you please place an order for a BMET? I will schedule the labs at the time I schedule the CT.  Thanks.  Benna Dunks

## 2021-08-29 ENCOUNTER — Emergency Department (HOSPITAL_COMMUNITY): Payer: 59

## 2021-08-29 ENCOUNTER — Encounter (HOSPITAL_COMMUNITY): Payer: Self-pay

## 2021-08-29 ENCOUNTER — Telehealth: Payer: Self-pay | Admitting: Interventional Cardiology

## 2021-08-29 ENCOUNTER — Other Ambulatory Visit: Payer: Self-pay

## 2021-08-29 ENCOUNTER — Observation Stay (HOSPITAL_COMMUNITY)
Admission: EM | Admit: 2021-08-29 | Discharge: 2021-08-30 | Disposition: A | Discharge status: Home / Self Care | Payer: 59 | Attending: Internal Medicine | Admitting: Internal Medicine

## 2021-08-29 DIAGNOSIS — I712 Thoracic aortic aneurysm, without rupture, unspecified: Secondary | ICD-10-CM | POA: Diagnosis present

## 2021-08-29 DIAGNOSIS — Z7901 Long term (current) use of anticoagulants: Secondary | ICD-10-CM | POA: Insufficient documentation

## 2021-08-29 DIAGNOSIS — R079 Chest pain, unspecified: Secondary | ICD-10-CM | POA: Diagnosis present

## 2021-08-29 DIAGNOSIS — I10 Essential (primary) hypertension: Secondary | ICD-10-CM | POA: Diagnosis not present

## 2021-08-29 DIAGNOSIS — Z20822 Contact with and (suspected) exposure to covid-19: Secondary | ICD-10-CM | POA: Diagnosis not present

## 2021-08-29 DIAGNOSIS — Z86711 Personal history of pulmonary embolism: Secondary | ICD-10-CM | POA: Diagnosis not present

## 2021-08-29 DIAGNOSIS — R0789 Other chest pain: Principal | ICD-10-CM | POA: Insufficient documentation

## 2021-08-29 DIAGNOSIS — E039 Hypothyroidism, unspecified: Secondary | ICD-10-CM | POA: Diagnosis present

## 2021-08-29 DIAGNOSIS — Z91040 Latex allergy status: Secondary | ICD-10-CM | POA: Diagnosis not present

## 2021-08-29 DIAGNOSIS — Z79899 Other long term (current) drug therapy: Secondary | ICD-10-CM | POA: Insufficient documentation

## 2021-08-29 DIAGNOSIS — R072 Precordial pain: Secondary | ICD-10-CM

## 2021-08-29 DIAGNOSIS — I25118 Atherosclerotic heart disease of native coronary artery with other forms of angina pectoris: Secondary | ICD-10-CM

## 2021-08-29 LAB — COMPREHENSIVE METABOLIC PANEL
ALT: 26 U/L (ref 0–44)
AST: 29 U/L (ref 15–41)
Albumin: 3.6 g/dL (ref 3.5–5.0)
Alkaline Phosphatase: 56 U/L (ref 38–126)
Anion gap: 7 (ref 5–15)
BUN: 11 mg/dL (ref 6–20)
CO2: 25 mmol/L (ref 22–32)
Calcium: 9 mg/dL (ref 8.9–10.3)
Chloride: 108 mmol/L (ref 98–111)
Creatinine, Ser: 1.15 mg/dL (ref 0.61–1.24)
GFR, Estimated: >60 mL/min (ref >60)
Glucose, Bld: 93 mg/dL (ref 70–99)
Potassium: 4.4 mmol/L (ref 3.5–5.1)
Sodium: 140 mmol/L (ref 135–145)
Total Bilirubin: 0.7 mg/dL (ref 0.3–1.2)
Total Protein: 7.1 g/dL (ref 6.5–8.1)

## 2021-08-29 LAB — CBC WITH DIFFERENTIAL/PLATELET
Abs Immature Granulocytes: 0.07 10*3/uL (ref 0.00–0.07)
Basophils Absolute: 0 10*3/uL (ref 0.0–0.1)
Basophils Relative: 0 %
Eosinophils Absolute: 0.1 10*3/uL (ref 0.0–0.5)
Eosinophils Relative: 1 %
HCT: 46.1 % (ref 39.0–52.0)
Hemoglobin: 16.3 g/dL (ref 13.0–17.0)
Immature Granulocytes: 1 %
Lymphocytes Relative: 16 %
Lymphs Abs: 1.4 10*3/uL (ref 0.7–4.0)
MCH: 33.8 pg (ref 26.0–34.0)
MCHC: 35.4 g/dL (ref 30.0–36.0)
MCV: 95.6 fL (ref 80.0–100.0)
Monocytes Absolute: 1 10*3/uL (ref 0.1–1.0)
Monocytes Relative: 12 %
Neutro Abs: 6 10*3/uL (ref 1.7–7.7)
Neutrophils Relative %: 70 %
Platelets: 222 10*3/uL (ref 150–400)
RBC: 4.82 MIL/uL (ref 4.22–5.81)
RDW: 13.1 % (ref 11.5–15.5)
WBC: 8.6 10*3/uL (ref 4.0–10.5)
nRBC: 0 % (ref 0.0–0.2)

## 2021-08-29 LAB — TROPONIN I (HIGH SENSITIVITY)
Troponin I (High Sensitivity): 10 ng/L (ref <18)
Troponin I (High Sensitivity): 13 ng/L (ref <18)
Troponin I (High Sensitivity): 12 ng/L (ref <18)

## 2021-08-29 LAB — RESP PANEL BY RT-PCR (FLU A&B, COVID) ARPGX2
Influenza A by PCR: NEGATIVE
Influenza B by PCR: NEGATIVE
SARS Coronavirus 2 by RT PCR: NEGATIVE

## 2021-08-29 LAB — LIPASE, BLOOD: Lipase: 28 U/L (ref 11–51)

## 2021-08-29 LAB — HIV ANTIBODY (ROUTINE TESTING W REFLEX): HIV Screen 4th Generation wRfx: NONREACTIVE

## 2021-08-29 MED ORDER — ALUM & MAG HYDROXIDE-SIMETH 200-200-20 MG/5ML PO SUSP
30.0000 mL | Freq: Four times a day (QID) | ORAL | Status: DC | PRN
  Administered 2021-08-30: 30 mL via ORAL
  Filled 2021-08-29: qty 30

## 2021-08-29 MED ORDER — OXYCODONE HCL 5 MG PO TABS: 5.0000 mg | ORAL_TABLET | ORAL | Status: DC | PRN

## 2021-08-29 MED ORDER — SERTRALINE HCL 100 MG PO TABS
200.0000 mg | ORAL_TABLET | Freq: Every day | ORAL | Status: DC
  Administered 2021-08-30: 200 mg via ORAL
  Filled 2021-08-29: qty 2

## 2021-08-29 MED ORDER — METOPROLOL SUCCINATE ER 50 MG PO TB24
50.0000 mg | ORAL_TABLET | Freq: Every day | ORAL | Status: DC
  Administered 2021-08-30: 50 mg via ORAL
  Filled 2021-08-29: qty 1

## 2021-08-29 MED ORDER — APIXABAN 5 MG PO TABS
5.0000 mg | ORAL_TABLET | Freq: Two times a day (BID) | ORAL | Status: DC
  Administered 2021-08-29 – 2021-08-30 (×2): 5 mg via ORAL
  Filled 2021-08-29 (×2): qty 1

## 2021-08-29 MED ORDER — AMLODIPINE BESYLATE 10 MG PO TABS
10.0000 mg | ORAL_TABLET | Freq: Every day | ORAL | Status: DC
  Administered 2021-08-30: 10 mg via ORAL
  Filled 2021-08-29: qty 1

## 2021-08-29 MED ORDER — ONDANSETRON HCL 4 MG/2ML IJ SOLN: 4.0000 mg | Freq: Four times a day (QID) | INTRAMUSCULAR | Status: DC | PRN

## 2021-08-29 MED ORDER — ACETAMINOPHEN 325 MG PO TABS: 650.0000 mg | ORAL_TABLET | ORAL | Status: DC | PRN

## 2021-08-29 MED ORDER — IOHEXOL 350 MG/ML SOLN
100.0000 mL | Freq: Once | INTRAVENOUS | Status: AC | PRN
  Administered 2021-08-29: 100 mL via INTRAVENOUS

## 2021-08-29 MED ORDER — SODIUM CHLORIDE 0.9 % IV BOLUS
500.0000 mL | Freq: Once | INTRAVENOUS | Status: AC
  Administered 2021-08-29: 500 mL via INTRAVENOUS

## 2021-08-29 MED ORDER — LEVOTHYROXINE SODIUM 100 MCG PO TABS
200.0000 ug | ORAL_TABLET | Freq: Every day | ORAL | Status: DC
  Administered 2021-08-30: 200 ug via ORAL
  Filled 2021-08-29: qty 2

## 2021-08-29 MED ORDER — IRBESARTAN 150 MG PO TABS
300.0000 mg | ORAL_TABLET | Freq: Every day | ORAL | Status: DC
  Administered 2021-08-30: 300 mg via ORAL
  Filled 2021-08-29: qty 2

## 2021-08-29 MED ORDER — OXYCODONE-ACETAMINOPHEN 5-325 MG PO TABS
1.0000 | ORAL_TABLET | Freq: Once | ORAL | Status: AC
  Administered 2021-08-29: 1 via ORAL
  Filled 2021-08-29: qty 1

## 2021-08-29 MED ORDER — NITROGLYCERIN 0.4 MG SL SUBL
0.4000 mg | SUBLINGUAL_TABLET | SUBLINGUAL | Status: DC | PRN
  Administered 2021-08-30: 0.4 mg via SUBLINGUAL
  Filled 2021-08-29: qty 1

## 2021-08-29 MED ORDER — SPIRONOLACTONE 25 MG PO TABS
25.0000 mg | ORAL_TABLET | Freq: Every day | ORAL | Status: DC
  Administered 2021-08-30: 25 mg via ORAL
  Filled 2021-08-29: qty 1

## 2021-08-29 NOTE — ED Notes (Signed)
Patient transported to X-ray 

## 2021-08-29 NOTE — ED Notes (Signed)
RN attempted to call report to [facility] x2

## 2021-08-29 NOTE — ED Notes (Signed)
Pt ambulated self in room with pulse oximetry. Oxygen saturation 94% while laying down, and increased to 98% with ambulation. Pt stated that he felt slightly short of breath with ambulation. No wheezing noted.

## 2021-08-29 NOTE — ED Notes (Signed)
RN attempted to call report to [facility] x1.

## 2021-08-29 NOTE — ED Provider Notes (Signed)
[facility] EMERGENCY DEPARTMENT Provider Note   CSN: [id number] Arrival date & time: 08/29/21  1555    History Chief Complaint  Patient presents with   Chest Pain    [name] is a 55 y.o. male with past medical history significant for prior PE on chronic anticoagulation, hypertension, thoracic aneurysm who presents for evaluation of chest pain.  States occurred when getting out of the shower.  Occurs to left chest and back.  No radiation to arm or jaw.  Felt lightheaded like he was going to pass out.  Felt sweaty.  No prior history of MI.  States he has been compliant with his anticoagulation has not missed any doses.  No fever, chills, headache, numbness, abdominal pain, paresthesias or weakness.  States he does have a mild cough.  No PND or orthopnea.  States he feels overall "unwell."  Did recently get COVID and flu vaccine yesterday.  Given aspirin and nitro with EMS.  Rates his current pain a 5/10.  Described as aching.  Denies additional aggravating or alleviating factors.  History obtained from patient and past medical records.  No interpreter used   Per EMS patient pale, diaphoretic on arrival. Apparently was 89 % on RA with EMS however improved to 97 % on RA   Per prior Records. Normal Heart cath in 2017  [facility]- [facility]  HPI     Past Medical History:  Diagnosis Date   ABDOMINAL PAIN-EPIGASTRIC    COAGULOPATHY, COUMADIN-INDUCED    DYSPHAGIA OROPHARYNGEAL PHASE    Heartburn    HYPERLIPIDEMIA    HYPERTENSION    HYPERTENSION NEC    HYPOTHYROIDISM    PE    Primary hypercoagulable state (HCC)    Thoracic aneurysm without mention of rupture    Thoracic aortic aneurysm [facility])    VASCULITIS     Patient Active Problem List   Diagnosis Date Noted   Abnormal nuclear stress test    Chest pain 12/05/2016   Bicuspid aortic valve 10/17/2013   PE 06/16/2010   Hemorrhagic disorder due to intrinsic circulating anticoagulants (HCC) 04/07/2010   Primary  hypercoagulable state (HCC) 04/07/2010   CHEST PAIN UNSPECIFIED 04/07/2010   VASCULITIS 01/30/2010   Nonspecific (abnormal) findings on radiological and other examination of body structure 01/15/2010   CT, CHEST, ABNORMAL 01/15/2010   Aneurysm of thoracic aorta (HCC) 01/14/2010   Heartburn 01/14/2010   DYSPHAGIA 01/14/2010   ABDOMINAL PAIN-EPIGASTRIC 01/14/2010   Hypothyroidism 01/06/2010   Hyperlipidemia 01/06/2010   Essential hypertension 01/06/2010   LOSS OF WEIGHT 01/06/2010   DYSPHAGIA OROPHARYNGEAL PHASE 01/06/2010   DYSPHAGIA UNSPECIFIED 12/31/2009   Hypertension 12/31/2009    Past Surgical History:  Procedure Laterality Date   CARDIAC CATHETERIZATION N/A 12/08/2016   Procedure: Left Heart Cath and Coronary Angiography;  Surgeon: Lennette Bihari, MD;  Location: [facility] INVASIVE CV LAB;  Service: Cardiovascular;  Laterality: N/A;   ELBOW SURGERY Left 2010       Family History  Problem Relation Age of Onset   Heart disease Mother    Hypertension Father     Social History   Tobacco Use   Smoking status: Never   Smokeless tobacco: Never  Substance Use Topics   Alcohol use: No   Drug use: No    Home Medications Prior to Admission medications   Medication Sig Start Date End Date Taking? Authorizing Provider  amLODipine (NORVASC) 10 MG tablet Take 1 tablet (10 mg total) by mouth daily. 08/20/21  Yes Lance Muss  S, MD  apixaban (ELIQUIS) 5 MG TABS tablet Take 1 tablet (5 mg total) by mouth 2 (two) times daily. 08/20/21  Yes Corky Crafts, MD  Glycerin-Polysorbate 80 (REFRESH DRY EYE THERAPY OP) Place 1-2 drops into both eyes daily as needed (dry eyes).   Yes [provider]  levothyroxine (SYNTHROID) 200 MCG tablet Take 200 mcg by mouth daily. 04/23/20  Yes [provider]  metoprolol succinate (TOPROL-XL) 50 MG 24 hr tablet Take 1 tablet (50 mg total) by mouth daily. 08/20/21  Yes Corky Crafts, MD  nitroGLYCERIN (NITROSTAT) 0.4 MG SL  tablet Place 1 tablet under the tongue as needed for chest pain. 05/13/21  Yes [provider]  omeprazole-sodium bicarbonate (ZEGERID) 40-1100 MG capsule Take 1 capsule by mouth daily as needed (acid reflux).   Yes [provider]  potassium chloride SA (KLOR-CON) 20 MEQ tablet Take 20 mEq by mouth 3 (three) times daily.   Yes [provider]  sertraline (ZOLOFT) 100 MG tablet Take 200 mg by mouth daily.  10/30/19  Yes [provider]  spironolactone (ALDACTONE) 25 MG tablet Take 1 tablet (25 mg total) by mouth daily. 08/20/21  Yes Corky Crafts, MD  valsartan (DIOVAN) 320 MG tablet Take 1 tablet (320 mg total) by mouth daily. 08/20/21  Yes Corky Crafts, MD    Allergies    Latex  Review of Systems   Review of Systems  Constitutional:  Positive for fatigue. Negative for activity change, appetite change, chills, diaphoresis, fever and unexpected weight change.  HENT: Negative.    Respiratory:  Positive for cough, chest tightness and shortness of breath. Negative for apnea, choking, wheezing and stridor.   Cardiovascular:  Positive for chest pain. Negative for palpitations and leg swelling.  Gastrointestinal: Negative.   Genitourinary: Negative.   Musculoskeletal: Negative.   Skin: Negative.   Neurological:  Positive for dizziness, weakness (Generalized) and light-headedness. Negative for tremors, seizures, syncope, facial asymmetry, speech difficulty, numbness and headaches.  All other systems reviewed and are negative.  Physical Exam Updated Vital Signs BP 104/87   Pulse 78   Temp 98.6 F (37 C) (Oral)   Resp 14   Ht 6\' 3"  (1.905 m)   Wt 117.9 kg   SpO2 96%   BMI 32.49 kg/m   Physical Exam Vitals and nursing note reviewed.  Constitutional:      General: He is not in acute distress.    Appearance: He is well-developed. He is not ill-appearing, toxic-appearing or diaphoretic.  HENT:     Head: Atraumatic.  Eyes:     Pupils:  Pupils are equal, round, and reactive to light.  Cardiovascular:     Rate and Rhythm: Normal rate and regular rhythm.     Pulses:          Radial pulses are 2+ on the right side and 2+ on the left side.       Dorsalis pedis pulses are 2+ on the right side and 2+ on the left side.       Posterior tibial pulses are 2+ on the right side and 2+ on the left side.     Heart sounds: Normal heart sounds.  Pulmonary:     Effort: Pulmonary effort is normal. No respiratory distress.     Breath sounds: Normal breath sounds.     Comments: Speaks in full sentences without difficulty. Clear bilaterally Chest:     Comments: Equal rise and fall to chest wall Abdominal:  General: Bowel sounds are normal. There is no distension.     Palpations: Abdomen is soft.     Comments: Soft, non tender  Musculoskeletal:        General: Normal range of motion.     Cervical back: Normal range of motion and neck supple.     Right lower leg: No tenderness. No edema.     Left lower leg: No tenderness. No edema.     Comments: No bony tenderness, Compartments soft. Homans negative.  Skin:    General: Skin is warm and dry.     Capillary Refill: Capillary refill takes less than 2 seconds.     Findings: Rash present.  Neurological:     General: No focal deficit present.     Mental Status: He is alert and oriented to person, place, and time.    ED Results / Procedures / Treatments   Labs (all labs ordered are listed, but only abnormal results are displayed) Labs Reviewed  RESP PANEL BY RT-PCR (FLU A&B, COVID) ARPGX2  CBC WITH DIFFERENTIAL/PLATELET  COMPREHENSIVE METABOLIC PANEL  TROPONIN I (HIGH SENSITIVITY)  TROPONIN I (HIGH SENSITIVITY)    EKG None  Radiology DG Chest 2 View  Result Date: 08/29/2021 CLINICAL DATA:  Chest pain short of breath EXAM: CHEST - 2 VIEW COMPARISON:  11/05/2020 FINDINGS: The heart size and mediastinal contours are within normal limits. Mild aortic atherosclerosis. Both lungs are  clear. The visualized skeletal structures are unremarkable. IMPRESSION: No active cardiopulmonary disease. Electronically Signed   By: Jasmine Pang M.D.   On: 08/29/2021 16:49   CT Angio Chest/Abd/Pel for Dissection W and/or W/WO  Result Date: 08/29/2021 CLINICAL DATA:  Chest and back pain. EXAM: CT ANGIOGRAPHY CHEST, ABDOMEN AND PELVIS TECHNIQUE: Non-contrast CT of the chest was initially obtained. Multidetector CT imaging through the chest, abdomen and pelvis was performed using the standard protocol during bolus administration of intravenous contrast. Multiplanar reconstructed images and MIPs were obtained and reviewed to evaluate the vascular anatomy. CONTRAST:  OMNIPAQUE IOHEXOL 350 MG/ML SOLN COMPARISON:  CT angiogram chest abdomen and pelvis 11/05/2020. CT chest angiogram 12/28/2016. FINDINGS: CTA CHEST FINDINGS Cardiovascular: Preferential opacification of the thoracic aorta. Normal heart size. No pericardial effusion. There is a stable 4.5 cm ascending aortic aneurysm. There is no evidence for aortic dissection. Origin of the great vessels demonstrate conventional anatomy in appears within normal limits. There is no central pulmonary embolism. Mediastinum/Nodes: No enlarged mediastinal, hilar, or axillary lymph nodes. Thyroid gland, trachea, and esophagus demonstrate no significant findings. Lungs/Pleura: There is a stable 4 mm nodular density in the right lower lobe image 9/89 unchanged from 2018 compatible with benign etiology. There is minimal linear atelectasis in the right lower lobe. There is no pleural effusion or pneumothorax identified Musculoskeletal: There is no acute fracture or dislocation. There is right-sided gynecomastia which has increased compared to the prior study. Review of the MIP images confirms the above findings. CTA ABDOMEN AND PELVIS FINDINGS VASCULAR Aorta: Normal caliber aorta without aneurysm, dissection, vasculitis or significant stenosis. Celiac: Patent without  evidence of aneurysm, dissection, vasculitis or significant stenosis. SMA: Patent without evidence of aneurysm, dissection, vasculitis or significant stenosis. Renals: Both renal arteries are patent without evidence of aneurysm, dissection, vasculitis, fibromuscular dysplasia or significant stenosis. IMA: Patent without evidence of aneurysm, dissection, vasculitis or significant stenosis. Inflow: Patent without evidence of aneurysm, dissection, vasculitis or significant stenosis. Veins: No obvious venous abnormality within the limitations of this arterial phase study. Review of the MIP  images confirms the above findings. NON-VASCULAR Hepatobiliary: Multiple rounded low-attenuation lesions in the liver are too small to characterize and unchanged, most likely cysts. No new liver lesions are identified. Gallbladder and bile ducts are within normal limits. Pancreas: Unremarkable. No pancreatic ductal dilatation or surrounding inflammatory changes. Spleen: Normal in size without focal abnormality. Adrenals/Urinary Tract: The bilateral adrenal glands are within normal limits. There is a 3.3 cm left renal cyst which is similar to the prior study. The kidneys are otherwise within normal limits. The bladder is within normal limits. Stomach/Bowel: Stomach is within normal limits. Appendix appears normal. No evidence of bowel wall thickening, distention, or inflammatory changes. Lymphatic: No enlarged lymph nodes are identified. Reproductive: Prostate is unremarkable. Other: There is a small fat containing left inguinal hernia. There is no ascites. Musculoskeletal: No acute or significant osseous findings. Review of the MIP images confirms the above findings. IMPRESSION: 1. Stable ascending aortic aneurysm measuring 4.5 cm. 2. No acute localizing process in the chest, abdomen or pelvis. 3. New right-sided gynecomastia. Electronically Signed   By: Darliss CheneyAmy  Guttmann M.D.   On: 08/29/2021 19:33    Procedures Procedures    Medications Ordered in ED Medications  sodium chloride 0.9 % bolus 500 mL (500 mLs Intravenous Bolus 08/29/21 1635)  iohexol (OMNIPAQUE) 350 MG/ML injection 100 mL (100 mLs Intravenous Contrast Given 08/29/21 1845)  oxyCODONE-acetaminophen (PERCOCET/ROXICET) 5-325 MG per tablet 1 tablet (1 tablet Oral Given 08/29/21 2130)   ED Course  I have reviewed the triage vital signs and the nursing notes.  Pertinent labs & imaging results that were available during my care of the patient were reviewed by me and considered in my medical decision making (see chart for details).  Here for evaluation of CP beginning at 1PM today with associated back pain, diaphoresis and SOB. Afebrile,non septic non ill appearing. Hx of aneurysm however No neuro deficets, equal pulses BL. Heart and lungs clear. Abd soft. Hx of PE however compliant with Eliquis. Normal heart cath in 2017.  Labs and imaging personally reviewed and interpreted:  CBC without leukocytosis Metabolic panel with electrolyte, renal or liver abnormalities Trop 10,13 COVID< FLU neg  Patient reassessed. Still having pain. No diaphoresis, SOB, cough. Will plan on consult with Cardiology.  Unclear etiology of patient's pain.  Low suspicion for acute infectious process, PE, dissection, STEMI.  CONSULT with Dr. Welton FlakesKhan with Cardiology. Reviewed patients chart . Rec medicine admit for observation.   CONSULT with Dr. Antionette Charpyd with [facility], will admit for observation.  The patient appears reasonably stabilized for admission considering the current resources, flow, and capabilities available in the ED at this time, and I doubt any [facility] requiring further screening and/or treatment in the ED prior to admission.      MDM Rules/Calculators/A&P                            Final Clinical Impression(s) / ED Diagnoses Final diagnoses:  Precordial pain    Rx / DC Orders ED Discharge Orders     None        Luisana Lutzke A, PA-C 08/29/21 2137     Tegeler, Canary Brimhristopher J, MD 08/30/21 [id number]

## 2021-08-29 NOTE — ED Triage Notes (Signed)
Sudden onset of CP at 1300 getting out of shower got a sharp left chest pain no radiations 7/10 with dizziness and SOB denies N/V.  EMS states pt was clammy and pale on arrival and now back to baseline 89% on RA on their arrival on 2 L to 97%  they now have him on RA 97% HR initially 108 and currently 86.

## 2021-08-29 NOTE — ED Notes (Signed)
Patient transported to CT 

## 2021-08-29 NOTE — Telephone Encounter (Signed)
I spoke with patient.  Every time he stands up he loses his balance and feels like he will black out. He is short of breath and sweating a lot. He is having left upper chest pain. His heart rate is 120.  I advised patient to call 911.

## 2021-08-29 NOTE — H&P (Signed)
History and Physical    [name] GUR:[id number] DOB: 12/13/1966 DOA: 08/29/2021  PCP: Daisy Floro, MD   Patient coming from: Home   Chief Complaint: Chest pain, lightheaded   HPI: [name] is a 55 y.o. male with medical history significant for hypothyroidism, hypertension, DVT and PE on Eliquis, and normal coronaries on cardiac catheterization in 2017, now presenting with chest pain and lightheadedness.  Patient received a COVID-19 vaccination yesterday, was in his usual state, but then developed lightheadedness with left-sided chest pain, and mild shortness of breath earlier this afternoon.  Pain is sharp, localized to the left chest, 7 out of 10 in severity, associated with diaphoresis when it began, but no nausea or vomiting.  EMS reports that the patient was pale and clammy with oxygen saturation of 89%.  He was given aspirin and nitroglycerin prior to arrival with slight improvement in symptoms. No change in symptoms with cough or deep breath. Seemed to get worse when ambulating in ED.   ED Course: Upon arrival to the ED, patient is found to be afebrile, saturating mid to upper 90s on room air, and with stable blood pressure.  EKG features sinus rhythm without acute ischemic features.  Chest x-ray negative for acute cardiopulmonary disease.  CTA chest/abdomen/pelvis features stable ascending aortic aneurysm and no acute finding.  CMP and CBC are unremarkable.  High-sensitivity troponin normal x2.  COVID and influenza PCR are negative.  ED PA discussed the case with cardiology fellow who recommended observation on the medical service with formal consultation in the morning if chest pain persist.  Review of Systems:  All other systems reviewed and apart from HPI, are negative.  Past Medical History:  Diagnosis Date   ABDOMINAL PAIN-EPIGASTRIC    COAGULOPATHY, COUMADIN-INDUCED    DYSPHAGIA OROPHARYNGEAL PHASE    Heartburn    HYPERLIPIDEMIA    HYPERTENSION     HYPERTENSION NEC    HYPOTHYROIDISM    PE    Primary hypercoagulable state (HCC)    Thoracic aneurysm without mention of rupture    Thoracic aortic aneurysm (HCC)    VASCULITIS     Past Surgical History:  Procedure Laterality Date   CARDIAC CATHETERIZATION N/A 12/08/2016   Procedure: Left Heart Cath and Coronary Angiography;  Surgeon: Lennette Bihari, MD;  Location: [facility];  Service: Cardiovascular;  Laterality: N/A;   ELBOW SURGERY Left 2010    Social History:   reports that he has never smoked. He has never used smokeless tobacco. He reports that he does not drink alcohol and does not use drugs.  Allergies  Allergen Reactions   Latex Rash    Family History  Problem Relation Age of Onset   Heart disease Mother    Hypertension Father      Prior to Admission medications   Medication Sig Start Date End Date Taking? Authorizing Provider  amLODipine (NORVASC) 10 MG tablet Take 1 tablet (10 mg total) by mouth daily. 08/20/21  Yes Corky Crafts, MD  apixaban (ELIQUIS) 5 MG TABS tablet Take 1 tablet (5 mg total) by mouth 2 (two) times daily. 08/20/21  Yes Corky Crafts, MD  Glycerin-Polysorbate 80 (REFRESH DRY EYE THERAPY OP) Place 1-2 drops into both eyes daily as needed (dry eyes).   Yes [provider]  levothyroxine (SYNTHROID) 200 MCG tablet Take 200 mcg by mouth daily. 04/23/20  Yes [provider]  metoprolol succinate (TOPROL-XL) 50 MG 24 hr tablet Take 1 tablet (50 mg  total) by mouth daily. 08/20/21  Yes Corky Crafts, MD  nitroGLYCERIN (NITROSTAT) 0.4 MG SL tablet Place 1 tablet under the tongue as needed for chest pain. 05/13/21  Yes [provider]  omeprazole-sodium bicarbonate (ZEGERID) 40-1100 MG capsule Take 1 capsule by mouth daily as needed (acid reflux).   Yes [provider]  potassium chloride SA (KLOR-CON) 20 MEQ tablet Take 20 mEq by mouth 3 (three) times daily.   Yes [provider]   sertraline (ZOLOFT) 100 MG tablet Take 200 mg by mouth daily.  10/30/19  Yes [provider]  spironolactone (ALDACTONE) 25 MG tablet Take 1 tablet (25 mg total) by mouth daily. 08/20/21  Yes Corky Crafts, MD  valsartan (DIOVAN) 320 MG tablet Take 1 tablet (320 mg total) by mouth daily. 08/20/21  Yes Corky Crafts, MD    Physical Exam: Vitals:   08/29/21 1922 08/29/21 1954 08/29/21 2100 08/29/21 2130  BP: 134/77 130/87 136/79 104/87  Pulse: 79 79 81 78  Resp: 18 (!) Temp:      TempSrc:      SpO2: 95% 93% 94% 96%  Weight:      Height:        Constitutional: NAD, calm  Eyes: PERTLA, lids and conjunctivae normal ENMT: Mucous membranes are moist. Posterior pharynx clear of any exudate or lesions.   Neck:  supple, no masses  Respiratory:  no wheezing, no crackles. No accessory muscle use.  Cardiovascular: S1 & S2 heard, regular rate and rhythm. No extremity edema.   Abdomen: No distension, no tenderness, soft. Bowel sounds active.  Musculoskeletal: no clubbing / cyanosis. No joint deformity upper and lower extremities.   Skin: crusted lesions on posterior neck and extremities. Warm, dry, well-perfused. Neurologic: CN 2-12 grossly intact. Sensation intact. Moving all extremities.  Psychiatric: Alert and oriented to person, place, and situation. Pleasant and cooperative.    Labs and Imaging on Admission: I have personally reviewed following labs and imaging studies  CBC: Recent Labs  Lab 08/29/21 1607  WBC 8.6  NEUTROABS 6.0  HGB 16.3  HCT 46.1  MCV 95.6  PLT 222   Basic Metabolic Panel: Recent Labs  Lab 08/29/21 1607  NA 140  K 4.4  CL 108  CO2 25  GLUCOSE 93  BUN 11  CREATININE 1.15  CALCIUM 9.0   GFR: Estimated Creatinine Clearance: 104 mL/min (by C-G formula based on SCr of 1.15 mg/dL). Liver Function Tests: Recent Labs  Lab 08/29/21 1607  AST 29  ALT 26  ALKPHOS 56  BILITOT 0.7  PROT 7.1  ALBUMIN 3.6   No results for  input(s): LIPASE, AMYLASE in the last 168 hours. No results for input(s): AMMONIA in the last 168 hours. Coagulation Profile: No results for input(s): INR, PROTIME in the last 168 hours. Cardiac Enzymes: No results for input(s): CKTOTAL, CKMB, CKMBINDEX, TROPONINI in the last 168 hours. BNP (last 3 results) No results for input(s): PROBNP in the last 8760 hours. HbA1C: No results for input(s): HGBA1C in the last 72 hours. CBG: No results for input(s): GLUCAP in the last 168 hours. Lipid Profile: No results for input(s): CHOL, HDL, LDLCALC, TRIG, CHOLHDL, LDLDIRECT in the last 72 hours. Thyroid Function Tests: No results for input(s): TSH, T4TOTAL, FREET4, T3FREE, THYROIDAB in the last 72 hours. Anemia Panel: No results for input(s): VITAMINB12, FOLATE, FERRITIN, TIBC, IRON, RETICCTPCT in the last 72 hours. Urine analysis:    Component Value Date/Time   COLORURINE YELLOW  11/05/2020 1942   APPEARANCEUR CLEAR 11/05/2020 1942   LABSPEC 1.021 11/05/2020 1942   PHURINE 5.0 11/05/2020 1942   GLUCOSEU NEGATIVE 11/05/2020 1942   HGBUR MODERATE (A) 11/05/2020 1942   BILIRUBINUR NEGATIVE 11/05/2020 1942   KETONESUR NEGATIVE 11/05/2020 1942   PROTEINUR NEGATIVE 11/05/2020 1942   UROBILINOGEN 0.2 04/10/2010 1712   NITRITE NEGATIVE 11/05/2020 1942   LEUKOCYTESUR NEGATIVE 11/05/2020 1942   Sepsis Labs: @LABRCNTIP (procalcitonin:4,lacticidven:4) ) Recent Results (from the past 240 hour(s))  Resp Panel by RT-PCR (Flu A&B, Covid) Nasopharyngeal Swab     Status: None   Collection Time: 08/29/21  4:38 PM   Specimen: Nasopharyngeal Swab; Nasopharyngeal(NP) swabs in vial transport medium  Result Value Ref Range Status   SARS Coronavirus 2 by RT PCR NEGATIVE NEGATIVE Final    Comment: (NOTE) SARS-CoV-2 target nucleic acids are NOT DETECTED.  The SARS-CoV-2 RNA is generally detectable in upper respiratory specimens during the acute phase of infection. The lowest concentration of SARS-CoV-2  viral copies this assay can detect is 138 copies/mL. A negative result does not preclude SARS-Cov-2 infection and should not be used as the sole basis for treatment or other patient management decisions. A negative result may occur with  improper specimen collection/handling, submission of specimen other than nasopharyngeal swab, presence of viral mutation(s) within the areas targeted by this assay, and inadequate number of viral copies(<138 copies/mL). A negative result must be combined with clinical observations, patient history, and epidemiological information. The expected result is Negative.  Fact Sheet for Patients:  10/29/21  Fact Sheet for Healthcare Providers:  BloggerCourse.com  This test is no t yet approved or cleared by the [employer] FDA and  has been authorized for detection and/or diagnosis of SARS-CoV-2 by FDA under an Emergency Use Authorization (EUA). This EUA will remain  in effect (meaning this test can be used) for the duration of the COVID-19 declaration under Section 564(b)(1) of the Act, 21 U.S.C.section 360bbb-3(b)(1), unless the authorization is terminated  or revoked sooner.       Influenza A by PCR NEGATIVE NEGATIVE Final   Influenza B by PCR NEGATIVE NEGATIVE Final    Comment: (NOTE) The Xpert Xpress SARS-CoV-2/FLU/RSV plus assay is intended as an aid in the diagnosis of influenza from Nasopharyngeal swab specimens and should not be used as a sole basis for treatment. Nasal washings and aspirates are unacceptable for Xpert Xpress SARS-CoV-2/FLU/RSV testing.  Fact Sheet for Patients: [place]  Fact Sheet for Healthcare Providers: BloggerCourse.com  This test is not yet approved or cleared by the [employer] FDA and has been authorized for detection and/or diagnosis of SARS-CoV-2 by FDA under an Emergency Use Authorization  (EUA). This EUA will remain in effect (meaning this test can be used) for the duration of the COVID-19 declaration under Section 564(b)(1) of the Act, 21 U.S.C. section 360bbb-3(b)(1), unless the authorization is terminated or revoked.  Performed at [facility], [address], [place], [place]      Radiological Exams on Admission: DG Chest 2 View  Result Date: 08/29/2021 CLINICAL DATA:  Chest pain short of breath EXAM: CHEST - 2 VIEW COMPARISON:  11/05/2020 FINDINGS: The heart size and mediastinal contours are within normal limits. Mild aortic atherosclerosis. Both lungs are clear. The visualized skeletal structures are unremarkable. IMPRESSION: No active cardiopulmonary disease. Electronically Signed   By: 11/07/2020 M.D.   On: 08/29/2021 16:49   CT Angio Chest/Abd/Pel for Dissection W and/or W/WO  Result Date: 08/29/2021 CLINICAL DATA:  Chest and back pain. EXAM: CT ANGIOGRAPHY CHEST, ABDOMEN AND PELVIS TECHNIQUE: Non-contrast CT of the chest was initially obtained. Multidetector CT imaging through the chest, abdomen and pelvis was performed using the standard protocol during bolus administration of intravenous contrast. Multiplanar reconstructed images and MIPs were obtained and reviewed to evaluate the vascular anatomy. CONTRAST:  100mL OMNIPAQUE IOHEXOL 350 MG/ML SOLN COMPARISON:  CT angiogram chest abdomen and pelvis 11/05/2020. CT chest angiogram 12/28/2016. FINDINGS: CTA CHEST FINDINGS Cardiovascular: Preferential opacification of the thoracic aorta. Normal heart size. No pericardial effusion. There is a stable 4.5 cm ascending aortic aneurysm. There is no evidence for aortic dissection. Origin of the great vessels demonstrate conventional anatomy in appears within normal limits. There is no central pulmonary embolism. Mediastinum/Nodes: No enlarged mediastinal, hilar, or axillary lymph nodes. Thyroid gland, trachea, and esophagus demonstrate no significant findings.  Lungs/Pleura: There is a stable 4 mm nodular density in the right lower lobe image 9/89 unchanged from 2018 compatible with benign etiology. There is minimal linear atelectasis in the right lower lobe. There is no pleural effusion or pneumothorax identified Musculoskeletal: There is no acute fracture or dislocation. There is right-sided gynecomastia which has increased compared to the prior study. Review of the MIP images confirms the above findings. CTA ABDOMEN AND PELVIS FINDINGS VASCULAR Aorta: Normal caliber aorta without aneurysm, dissection, vasculitis or significant stenosis. Celiac: Patent without evidence of aneurysm, dissection, vasculitis or significant stenosis. SMA: Patent without evidence of aneurysm, dissection, vasculitis or significant stenosis. Renals: Both renal arteries are patent without evidence of aneurysm, dissection, vasculitis, fibromuscular dysplasia or significant stenosis. IMA: Patent without evidence of aneurysm, dissection, vasculitis or significant stenosis. Inflow: Patent without evidence of aneurysm, dissection, vasculitis or significant stenosis. Veins: No obvious venous abnormality within the limitations of this arterial phase study. Review of the MIP images confirms the above findings. NON-VASCULAR Hepatobiliary: Multiple rounded low-attenuation lesions in the liver are too small to characterize and unchanged, most likely cysts. No new liver lesions are identified. Gallbladder and bile ducts are within normal limits. Pancreas: Unremarkable. No pancreatic ductal dilatation or surrounding inflammatory changes. Spleen: Normal in size without focal abnormality. Adrenals/Urinary Tract: The bilateral adrenal glands are within normal limits. There is a 3.3 cm left renal cyst which is similar to the prior study. The kidneys are otherwise within normal limits. The bladder is within normal limits. Stomach/Bowel: Stomach is within normal limits. Appendix appears normal. No evidence of bowel  wall thickening, distention, or inflammatory changes. Lymphatic: No enlarged lymph nodes are identified. Reproductive: Prostate is unremarkable. Other: There is a small fat containing left inguinal hernia. There is no ascites. Musculoskeletal: No acute or significant osseous findings. Review of the MIP images confirms the above findings. IMPRESSION: 1. Stable ascending aortic aneurysm measuring 4.5 cm. 2. No acute localizing process in the chest, abdomen or pelvis. 3. New right-sided gynecomastia. Electronically Signed   By: Darliss CheneyAmy  Guttmann M.D.   On: 08/29/2021 19:33    EKG: Independently reviewed. Sinus rhythm.   Assessment/Plan   1. Chest pain - Presents with sharp non-radiating chest pain  - Given ASA and NTG prior to arrival with slight improvement  - Normal coronaries on cath in 2017  - No ischemic features on EKG, no acute finding on CTA chest/abd/pelvis, and HS troponin normal x2  - Check 3rd troponin and lipase, trial Maalox, continue cardiac monitoring, consult cardiology in am if pain persists and no non-cardiac etiology identified    2. Hx of DVT and PE  -  Hx of PE in 2011 and DVT in 2021 - No central PE on CTA in ED  - Continue Eliquis    3. Hypertension   - Continue Norvasc, Toprol, ARB, and Aldactone   4. Ascending thoracic aortic aneurysm - Stable on CTA in ED, continue outpatient follow-up    DVT prophylaxis: Eliquis  Code Status: Full  Level of Care: Level of care: Telemetry Medical Family Communication: none present  Disposition Plan:  Patient is from: Home  Anticipated d/c is to: Home  Anticipated d/c date is: 08/30/21 Patient currently: Pending cardiac monitoring, trial Maalox, cardiology consultation if chest pain persists  Consults called: none  Admission status:  Observation   Briscoe Deutscher, MD [facility]  08/29/2021, 9:44 PM

## 2021-08-29 NOTE — Telephone Encounter (Signed)
STAT if patient feels like he/she is going to faint   Are you dizzy now?  Not currently because he is sitting. He states whenever he stands up he gets dizzy.  Do you feel faint or have you passed out?  No   Do you have any other symptoms?  Sweating   Have you checked your HR and BP (record if available)?  BP: 101/74  States he had his second COVID booster shot and flu shot yesterday.

## 2021-08-30 DIAGNOSIS — R079 Chest pain, unspecified: Secondary | ICD-10-CM

## 2021-08-30 DIAGNOSIS — E039 Hypothyroidism, unspecified: Secondary | ICD-10-CM | POA: Diagnosis not present

## 2021-08-30 DIAGNOSIS — R0789 Other chest pain: Secondary | ICD-10-CM | POA: Diagnosis not present

## 2021-08-30 DIAGNOSIS — Z20822 Contact with and (suspected) exposure to covid-19: Secondary | ICD-10-CM | POA: Diagnosis not present

## 2021-08-30 DIAGNOSIS — I10 Essential (primary) hypertension: Secondary | ICD-10-CM | POA: Diagnosis not present

## 2021-08-30 DIAGNOSIS — Z86711 Personal history of pulmonary embolism: Secondary | ICD-10-CM | POA: Diagnosis not present

## 2021-08-30 LAB — BASIC METABOLIC PANEL
Anion gap: 10 (ref 5–15)
BUN: 12 mg/dL (ref 6–20)
CO2: 28 mmol/L (ref 22–32)
Calcium: 9 mg/dL (ref 8.9–10.3)
Chloride: 105 mmol/L (ref 98–111)
Creatinine, Ser: 1.12 mg/dL (ref 0.61–1.24)
GFR, Estimated: >60 mL/min (ref >60)
Glucose, Bld: 107 mg/dL — ABNORMAL HIGH (ref 70–99)
Potassium: 4.4 mmol/L (ref 3.5–5.1)
Sodium: 143 mmol/L (ref 135–145)

## 2021-08-30 LAB — CBC
HCT: 42.5 % (ref 39.0–52.0)
Hemoglobin: 14.8 g/dL (ref 13.0–17.0)
MCH: 33.6 pg (ref 26.0–34.0)
MCHC: 34.8 g/dL (ref 30.0–36.0)
MCV: 96.4 fL (ref 80.0–100.0)
Platelets: 206 10*3/uL (ref 150–400)
RBC: 4.41 MIL/uL (ref 4.22–5.81)
RDW: 13.5 % (ref 11.5–15.5)
WBC: 6.5 10*3/uL (ref 4.0–10.5)
nRBC: 0 % (ref 0.0–0.2)

## 2021-08-30 MED ORDER — PANTOPRAZOLE SODIUM 40 MG PO TBEC
40.0000 mg | DELAYED_RELEASE_TABLET | Freq: Two times a day (BID) | ORAL | Status: DC
  Administered 2021-08-30: 40 mg via ORAL
  Filled 2021-08-30: qty 1

## 2021-08-30 MED ORDER — PANTOPRAZOLE SODIUM 40 MG PO TBEC: 40.0000 mg | DELAYED_RELEASE_TABLET | Freq: Two times a day (BID) | ORAL | 3 refills | Status: DC

## 2021-08-30 NOTE — Discharge Summary (Signed)
Physician Discharge Summary  [name] ZOX:[id number] DOB: December 30, 1965 DOA: 08/29/2021  PCP: Daisy Floro, MD  Admit date: 08/29/2021 Discharge date: 08/30/2021  Admitted From: Home Disposition:  Home  Recommendations for Outpatient Follow-up:  Follow up with Cards in 1-2 weeks for stress test as an outpatient. Please obtain BMP/CBC in one week   Home Health:no Equipment/Devices:none  Discharge Condition:Stable CODE STATUS:Full Diet recommendation: Heart Healthy   Brief/Interim Summary: 55 y.o. male past medical history significant for hypothyroidism, essential hypertension, DVT PE on Eliquis with a cardiac cath in 2017 showed normal coronaries presented with chest pain and lightheadedness.  Patient received a COVID booster today prior to admission.  And EMS was found to be satting 89% on room air twelve-lead EKG showed sinus rhythm without acute findings chest x-ray negative for acute cardiopulmonary disease, CT scan of the abdomen pelvis and chest shows stable ascending aortic aneurysm.  High sensitive troponin was negative x2 SARS-CoV-2 PCR and influenza were negative.  Discharge Diagnoses:  Atypical  Chest pain: Nonradiating nothing makes it better or worse, he has a history of GERD. He had a cardiac cath that showed normal coronaries 2017, twelve-lead EKG showed normal sinus rhythm normal axis no T wave abnormalities he did have T wave inversion in T3 with no other T wave inversion in any of the other leads. CT scan of the chest was negative for PE, CT scan of the abdomen pelvis showed no acute abnormalities. Cardiac biomarkers were negative x2 he had no further chest pain he was started on Protonix which she will continue as an outpatient. He will follow-up with cardiology in 1 to 2 weeks to get a stress test as an outpatient.  History of PE and DVT: Had a PE in 2021, he was continued on Eliquis CT angio of the chest was negative for PE.  Essential  hypertension: Well-controlled continue Norvasc, Toprol ARB and Aldactone.  Ascending aortic aneurysm: Stable CTA in the ED no change from previous.  Follow-up as an outpatient.   Discharge Instructions  Discharge Instructions     Diet - low sodium heart healthy   Complete by: As directed    Increase activity slowly   Complete by: As directed       Allergies as of 08/30/2021       Reactions   Latex Rash        Medication List     TAKE these medications    amLODipine 10 MG tablet Commonly known as: NORVASC Take 1 tablet (10 mg total) by mouth daily.   apixaban 5 MG Tabs tablet Commonly known as: Eliquis Take 1 tablet (5 mg total) by mouth 2 (two) times daily.   levothyroxine 200 MCG tablet Commonly known as: SYNTHROID Take 200 mcg by mouth daily.   metoprolol succinate 50 MG 24 hr tablet Commonly known as: TOPROL-XL Take 1 tablet (50 mg total) by mouth daily.   nitroGLYCERIN 0.4 MG SL tablet Commonly known as: NITROSTAT Place 1 tablet under the tongue as needed for chest pain.   omeprazole-sodium bicarbonate 40-1100 MG capsule Commonly known as: ZEGERID Take 1 capsule by mouth daily as needed (acid reflux).   pantoprazole 40 MG tablet Commonly known as: PROTONIX Take 1 tablet (40 mg total) by mouth 2 (two) times daily.   potassium chloride SA 20 MEQ tablet Commonly known as: KLOR-CON Take 20 mEq by mouth 3 (three) times daily.   REFRESH DRY EYE THERAPY OP Place 1-2 drops into both eyes daily as needed (  dry eyes).   sertraline 100 MG tablet Commonly known as: ZOLOFT Take 200 mg by mouth daily.   spironolactone 25 MG tablet Commonly known as: ALDACTONE Take 1 tablet (25 mg total) by mouth daily.   valsartan 320 MG tablet Commonly known as: DIOVAN Take 1 tablet (320 mg total) by mouth daily.        Allergies  Allergen Reactions   Latex Rash    Consultations: None   Procedures/Studies: DG Chest 2 View  Result Date:  08/29/2021 CLINICAL DATA:  Chest pain short of breath EXAM: CHEST - 2 VIEW COMPARISON:  11/05/2020 FINDINGS: The heart size and mediastinal contours are within normal limits. Mild aortic atherosclerosis. Both lungs are clear. The visualized skeletal structures are unremarkable. IMPRESSION: No active cardiopulmonary disease. Electronically Signed   By: Jasmine Pang M.D.   On: 08/29/2021 16:49   CT Angio Chest/Abd/Pel for Dissection W and/or W/WO  Result Date: 08/29/2021 CLINICAL DATA:  Chest and back pain. EXAM: CT ANGIOGRAPHY CHEST, ABDOMEN AND PELVIS TECHNIQUE: Non-contrast CT of the chest was initially obtained. Multidetector CT imaging through the chest, abdomen and pelvis was performed using the standard protocol during bolus administration of intravenous contrast. Multiplanar reconstructed images and MIPs were obtained and reviewed to evaluate the vascular anatomy. CONTRAST:  OMNIPAQUE IOHEXOL 350 MG/ML SOLN COMPARISON:  CT angiogram chest abdomen and pelvis 11/05/2020. CT chest angiogram 12/28/2016. FINDINGS: CTA CHEST FINDINGS Cardiovascular: Preferential opacification of the thoracic aorta. Normal heart size. No pericardial effusion. There is a stable 4.5 cm ascending aortic aneurysm. There is no evidence for aortic dissection. Origin of the great vessels demonstrate conventional anatomy in appears within normal limits. There is no central pulmonary embolism. Mediastinum/Nodes: No enlarged mediastinal, hilar, or axillary lymph nodes. Thyroid gland, trachea, and esophagus demonstrate no significant findings. Lungs/Pleura: There is a stable 4 mm nodular density in the right lower lobe image 9/89 unchanged from 2018 compatible with benign etiology. There is minimal linear atelectasis in the right lower lobe. There is no pleural effusion or pneumothorax identified Musculoskeletal: There is no acute fracture or dislocation. There is right-sided gynecomastia which has increased compared to the prior  study. Review of the MIP images confirms the above findings. CTA ABDOMEN AND PELVIS FINDINGS VASCULAR Aorta: Normal caliber aorta without aneurysm, dissection, vasculitis or significant stenosis. Celiac: Patent without evidence of aneurysm, dissection, vasculitis or significant stenosis. SMA: Patent without evidence of aneurysm, dissection, vasculitis or significant stenosis. Renals: Both renal arteries are patent without evidence of aneurysm, dissection, vasculitis, fibromuscular dysplasia or significant stenosis. IMA: Patent without evidence of aneurysm, dissection, vasculitis or significant stenosis. Inflow: Patent without evidence of aneurysm, dissection, vasculitis or significant stenosis. Veins: No obvious venous abnormality within the limitations of this arterial phase study. Review of the MIP images confirms the above findings. NON-VASCULAR Hepatobiliary: Multiple rounded low-attenuation lesions in the liver are too small to characterize and unchanged, most likely cysts. No new liver lesions are identified. Gallbladder and bile ducts are within normal limits. Pancreas: Unremarkable. No pancreatic ductal dilatation or surrounding inflammatory changes. Spleen: Normal in size without focal abnormality. Adrenals/Urinary Tract: The bilateral adrenal glands are within normal limits. There is a 3.3 cm left renal cyst which is similar to the prior study. The kidneys are otherwise within normal limits. The bladder is within normal limits. Stomach/Bowel: Stomach is within normal limits. Appendix appears normal. No evidence of bowel wall thickening, distention, or inflammatory changes. Lymphatic: No enlarged lymph nodes are identified. Reproductive: Prostate is unremarkable. Other:  There is a small fat containing left inguinal hernia. There is no ascites. Musculoskeletal: No acute or significant osseous findings. Review of the MIP images confirms the above findings. IMPRESSION: 1. Stable ascending aortic aneurysm  measuring 4.5 cm. 2. No acute localizing process in the chest, abdomen or pelvis. 3. New right-sided gynecomastia. Electronically Signed   By: Darliss Cheney M.D.   On: 08/29/2021 19:33   (Echo, Carotid, EGD, Colonoscopy, ERCP)    Subjective: No complaints chest pain-free tolerating his diet.  Discharge Exam: Vitals:   08/30/21 0111 08/30/21 0555  BP: 108/60 116/73  Pulse:  64  Resp:  18  Temp:  98.6 F (37 C)  SpO2:  93%   Vitals:   08/29/21 2329 08/30/21 0106 08/30/21 0111 08/30/21 0555  BP: 131/87 128/82 108/60 116/73  Pulse:  62  64  Resp: 20 20  18   Temp: 97.6 F (36.4 C)   98.6 F (37 C)  TempSrc: Oral   Oral  SpO2:  92%  93%  Weight:      Height:        General: Pt is alert, awake, not in acute distress Cardiovascular: RRR, S1/S2 +, no rubs, no gallops Respiratory: CTA bilaterally, no wheezing, no rhonchi Abdominal: Soft, NT, ND, bowel sounds + Extremities: no edema, no cyanosis    The results of significant diagnostics from this hospitalization (including imaging, microbiology, ancillary and laboratory) are listed below for reference.     Microbiology: Recent Results (from the past 240 hour(s))  Resp Panel by RT-PCR (Flu A&B, Covid) Nasopharyngeal Swab     Status: None   Collection Time: 08/29/21  4:38 PM   Specimen: Nasopharyngeal Swab; Nasopharyngeal(NP) swabs in vial transport medium  Result Value Ref Range Status   SARS Coronavirus 2 by RT PCR NEGATIVE NEGATIVE Final    Comment: (NOTE) SARS-CoV-2 target nucleic acids are NOT DETECTED.  The SARS-CoV-2 RNA is generally detectable in upper respiratory specimens during the acute phase of infection. The lowest concentration of SARS-CoV-2 viral copies this assay can detect is 138 copies/mL. A negative result does not preclude SARS-Cov-2 infection and should not be used as the sole basis for treatment or other patient management decisions. A negative result may occur with  improper specimen  collection/handling, submission of specimen other than nasopharyngeal swab, presence of viral mutation(s) within the areas targeted by this assay, and inadequate number of viral copies(<138 copies/mL). A negative result must be combined with clinical observations, patient history, and epidemiological information. The expected result is Negative.  Fact Sheet for Patients:  10/29/21  Fact Sheet for Healthcare Providers:  BloggerCourse.com  This test is no t yet approved or cleared by the [place] FDA and  has been authorized for detection and/or diagnosis of SARS-CoV-2 by FDA under an Emergency Use Authorization (EUA). This EUA will remain  in effect (meaning this test can be used) for the duration of the COVID-19 declaration under Section 564(b)(1) of the Act, 21 U.S.C.section 360bbb-3(b)(1), unless the authorization is terminated  or revoked sooner.       Influenza A by PCR NEGATIVE NEGATIVE Final   Influenza B by PCR NEGATIVE NEGATIVE Final    Comment: (NOTE) The Xpert Xpress SARS-CoV-2/FLU/RSV plus assay is intended as an aid in the diagnosis of influenza from Nasopharyngeal swab specimens and should not be used as a sole basis for treatment. Nasal washings and aspirates are unacceptable for Xpert Xpress SARS-CoV-2/FLU/RSV testing.  Fact Sheet for Patients: [place]  Fact Sheet for Healthcare  Providers: SeriousBroker.ithttps://www.fda.gov/media/152162/download  This test is not yet approved or cleared by the [place] FDA and has been authorized for detection and/or diagnosis of SARS-CoV-2 by FDA under an Emergency Use Authorization (EUA). This EUA will remain in effect (meaning this test can be used) for the duration of the COVID-19 declaration under Section 564(b)(1) of the Act, 21 U.S.C. section 360bbb-3(b)(1), unless the authorization is terminated or revoked.  Performed at [facility], [address] [place], [place] [address]      Labs: BNP (last 3 results) No results for input(s): BNP in the last 8760 hours. Basic Metabolic Panel: Recent Labs  Lab 08/29/21 1607 08/30/21 0206  NA 140 143  K 4.4 4.4  CL 108 105  CO2 25 28  GLUCOSE 93 107*  BUN 11 12  CREATININE 1.15 1.12  CALCIUM 9.0 9.0   Liver Function Tests: Recent Labs  Lab 08/29/21 1607  AST 29  ALT 26  ALKPHOS 56  BILITOT 0.7  PROT 7.1  ALBUMIN 3.6   Recent Labs  Lab 08/29/21 2158  LIPASE 28   No results for input(s): AMMONIA in the last 168 hours. CBC: Recent Labs  Lab 08/29/21 1607 08/30/21 0206  WBC 8.6 6.5  NEUTROABS 6.0  --   HGB 16.3 14.8  HCT 46.1 42.5  MCV 95.6 96.4  PLT 222 206   Cardiac Enzymes: No results for input(s): CKTOTAL, CKMB, CKMBINDEX, TROPONINI in the last 168 hours. BNP: Invalid input(s): POCBNP CBG: No results for input(s): GLUCAP in the last 168 hours. D-Dimer No results for input(s): DDIMER in the last 72 hours. Hgb A1c No results for input(s): HGBA1C in the last 72 hours. Lipid Profile No results for input(s): CHOL, HDL, LDLCALC, TRIG, CHOLHDL, LDLDIRECT in the last 72 hours. Thyroid function studies No results for input(s): TSH, T4TOTAL, T3FREE, THYROIDAB in the last 72 hours.  Invalid input(s): FREET3 Anemia work up No results for input(s): VITAMINB12, FOLATE, FERRITIN, TIBC, IRON, RETICCTPCT in the last 72 hours. Urinalysis    Component Value Date/Time   COLORURINE YELLOW 11/05/2020 1942   APPEARANCEUR CLEAR 11/05/2020 1942   LABSPEC 1.021 11/05/2020 1942   PHURINE 5.0 11/05/2020 1942   GLUCOSEU NEGATIVE 11/05/2020 1942   HGBUR MODERATE (A) 11/05/2020 1942   BILIRUBINUR NEGATIVE 11/05/2020 1942   KETONESUR NEGATIVE 11/05/2020 1942   PROTEINUR NEGATIVE 11/05/2020 1942   UROBILINOGEN 0.2 04/10/2010 1712   NITRITE NEGATIVE 11/05/2020 1942   LEUKOCYTESUR NEGATIVE 11/05/2020 1942   Sepsis Labs Invalid input(s):  PROCALCITONIN,  WBC,  LACTICIDVEN Microbiology Recent Results (from the past 240 hour(s))  Resp Panel by RT-PCR (Flu A&B, Covid) Nasopharyngeal Swab     Status: None   Collection Time: 08/29/21  4:38 PM   Specimen: Nasopharyngeal Swab; Nasopharyngeal(NP) swabs in vial transport medium  Result Value Ref Range Status   SARS Coronavirus 2 by RT PCR NEGATIVE NEGATIVE Final    Comment: (NOTE) SARS-CoV-2 target nucleic acids are NOT DETECTED.  The SARS-CoV-2 RNA is generally detectable in upper respiratory specimens during the acute phase of infection. The lowest concentration of SARS-CoV-2 viral copies this assay can detect is 138 copies/mL. A negative result does not preclude SARS-Cov-2 infection and should not be used as the sole basis for treatment or other patient management decisions. A negative result may occur with  improper specimen collection/handling, submission of specimen other than nasopharyngeal swab, presence of viral mutation(s) within the areas targeted by this assay, and inadequate number of viral copies(<138 copies/mL). A  negative result must be combined with clinical observations, patient history, and epidemiological information. The expected result is Negative.  Fact Sheet for Patients:  BloggerCourse.com  Fact Sheet for Healthcare Providers:  SeriousBroker.it  This test is no t yet approved or cleared by the [place] FDA and  has been authorized for detection and/or diagnosis of SARS-CoV-2 by FDA under an Emergency Use Authorization (EUA). This EUA will remain  in effect (meaning this test can be used) for the duration of the COVID-19 declaration under Section 564(b)(1) of the Act, 21 U.S.C.section 360bbb-3(b)(1), unless the authorization is terminated  or revoked sooner.       Influenza A by PCR NEGATIVE NEGATIVE Final   Influenza B by PCR NEGATIVE NEGATIVE Final    Comment: (NOTE) The Xpert Xpress  SARS-CoV-2/FLU/RSV plus assay is intended as an aid in the diagnosis of influenza from Nasopharyngeal swab specimens and should not be used as a sole basis for treatment. Nasal washings and aspirates are unacceptable for Xpert Xpress SARS-CoV-2/FLU/RSV testing.  Fact Sheet for Patients: BloggerCourse.com  Fact Sheet for Healthcare Providers: SeriousBroker.it  This test is not yet approved or cleared by the [place] FDA and has been authorized for detection and/or diagnosis of SARS-CoV-2 by FDA under an Emergency Use Authorization (EUA). This EUA will remain in effect (meaning this test can be used) for the duration of the COVID-19 declaration under Section 564(b)(1) of the Act, 21 U.S.C. section 360bbb-3(b)(1), unless the authorization is terminated or revoked.  Performed at [facility], [address] [place], [place] [address]      SIGNED:   Marinda Elk, MD  [facility] 08/30/2021, 7:36 AM Pager   If 7PM-7AM, please contact night-coverage [email or web address] Password [email or web address]

## 2021-09-01 ENCOUNTER — Encounter: Payer: Self-pay | Admitting: *Deleted

## 2021-09-01 NOTE — Telephone Encounter (Signed)
Left message to call office. CT angio of chest scheduled for 11/18 canceled as it was done in the hospital.  BMP prior to CT canceled also

## 2021-09-01 NOTE — Telephone Encounter (Signed)
Pt is returning call.  

## 2021-09-01 NOTE — Telephone Encounter (Signed)
I spoke with patient. He would like to proceed with exercise myoview.  I verbally went over instructions with patient and will send copy to him through my chart. Patient made aware CT and lab work canceled as CT was done in hospital

## 2021-09-01 NOTE — Telephone Encounter (Signed)
Plan for exercise myoview.  ? If sx were related to two vaccines the day prior.

## 2021-09-02 ENCOUNTER — Telehealth (HOSPITAL_COMMUNITY): Payer: Self-pay

## 2021-09-02 NOTE — Telephone Encounter (Signed)
Spoke with the patient, detailed instructions given. He stated that he would be here for his test. Asked to call back with any questions. S.Woodfin Kiss EMTP 

## 2021-09-04 ENCOUNTER — Other Ambulatory Visit: Payer: Self-pay

## 2021-09-04 ENCOUNTER — Ambulatory Visit (HOSPITAL_COMMUNITY): Payer: 59 | Attending: Cardiology

## 2021-09-04 DIAGNOSIS — R072 Precordial pain: Secondary | ICD-10-CM | POA: Diagnosis not present

## 2021-09-04 LAB — MYOCARDIAL PERFUSION IMAGING
Base ST Depression (mm): 0 mm
LV dias vol: 59 mL (ref 62–150)
LV sys vol: 19 mL
Nuc Stress EF: 67 %
Peak HR: 117 {beats}/min
Rest HR: 85 {beats}/min
Rest Nuclear Isotope Dose: 10.6 mCi
SDS: 0
SRS: 0
SSS: 0
ST Depression (mm): 0 mm
Stress Nuclear Isotope Dose: 32.4 mCi
TID: 0.79

## 2021-09-04 MED ORDER — REGADENOSON 0.4 MG/5ML IV SOLN
0.4000 mg | Freq: Once | INTRAVENOUS | Status: AC
  Administered 2021-09-04: 0.4 mg via INTRAVENOUS

## 2021-09-04 MED ORDER — TECHNETIUM TC 99M TETROFOSMIN IV KIT
32.4000 | PACK | Freq: Once | INTRAVENOUS | Status: AC | PRN
  Administered 2021-09-04: 32.4 via INTRAVENOUS
  Filled 2021-09-04: qty 33

## 2021-09-04 MED ORDER — TECHNETIUM TC 99M TETROFOSMIN IV KIT
10.6000 | PACK | Freq: Once | INTRAVENOUS | Status: AC | PRN
  Administered 2021-09-04: 10.6 via INTRAVENOUS
  Filled 2021-09-04: qty 11

## 2021-10-31 ENCOUNTER — Other Ambulatory Visit: Payer: Self-pay

## 2021-11-07 ENCOUNTER — Other Ambulatory Visit: Payer: Self-pay

## 2022-01-06 ENCOUNTER — Other Ambulatory Visit: Payer: Self-pay

## 2022-01-06 ENCOUNTER — Ambulatory Visit (INDEPENDENT_AMBULATORY_CARE_PROVIDER_SITE_OTHER): Payer: Self-pay | Admitting: Interventional Cardiology

## 2022-01-06 ENCOUNTER — Encounter: Payer: Self-pay | Admitting: Interventional Cardiology

## 2022-01-06 VITALS — BP 122/74 | HR 86 | Ht 75.0 in | Wt 283.0 lb

## 2022-01-06 DIAGNOSIS — I25118 Atherosclerotic heart disease of native coronary artery with other forms of angina pectoris: Secondary | ICD-10-CM

## 2022-01-06 DIAGNOSIS — E669 Obesity, unspecified: Secondary | ICD-10-CM

## 2022-01-06 DIAGNOSIS — Q231 Congenital insufficiency of aortic valve: Secondary | ICD-10-CM

## 2022-01-06 DIAGNOSIS — M7989 Other specified soft tissue disorders: Secondary | ICD-10-CM

## 2022-01-06 DIAGNOSIS — I7121 Aneurysm of the ascending aorta, without rupture: Secondary | ICD-10-CM

## 2022-01-06 DIAGNOSIS — I10 Essential (primary) hypertension: Secondary | ICD-10-CM

## 2022-01-06 NOTE — Patient Instructions (Signed)
Medication Instructions:  Your physician recommends that you continue on your current medications as directed. Please refer to the Current Medication list given to you today.  *If you need a refill on your cardiac medications before your next appointment, please call your pharmacy*   Lab Work: None--will need BMP prior to CTA in March/April If you have labs (blood work) drawn today and your tests are completely normal, you will receive your results only by: MyChart Message (if you have MyChart) OR A paper copy in the mail If you have any lab test that is abnormal or we need to change your treatment, we will call you to review the results.   Testing/Procedures: Non-Cardiac CT Angiography (CTA), is a special type of CT scan that uses a computer to produce multi-dimensional views of major blood vessels throughout the body. In CT angiography, a contrast material is injected through an IV to help visualize the blood vessels    Follow-Up: At [facility], you and your health needs are our priority.  As part of our continuing mission to provide you with exceptional heart care, we have created designated Provider Care Teams.  These Care Teams include your primary Cardiologist (physician) and Advanced Practice Providers (APPs -  Physician Assistants and Nurse Practitioners) who all work together to provide you with the care you need, when you need it.  We recommend signing up for the patient portal called "MyChart".  Sign up information is provided on this After Visit Summary.  MyChart is used to connect with patients for Virtual Visits (Telemedicine).  Patients are able to view lab/test results, encounter notes, upcoming appointments, etc.  Non-urgent messages can be sent to your provider as well.   To learn more about what you can do with MyChart, go to [email or web address].    Your next appointment:   12 month(s)  The format for your next appointment:   In Person  Provider:   Lance Muss, MD     Other Instructions

## 2022-01-06 NOTE — Progress Notes (Signed)
Cardiology Office Note   Date:  01/06/2022   ID:  [name], DOB 1966-09-21, MRN [id number]  PCP:  Lawerance Cruel, MD    Chief Complaint  Patient presents with   Follow-up    DVT/PE  Wt Readings from Last 3 Encounters:  01/06/22 283 lb (128.4 kg)  09/04/21 259 lb (117.5 kg)  08/29/21 259 lb 14.8 oz (117.9 kg)       History of Present Illness: [name] is a 56 y.o. male   with a bicuspid aortic valve, and associated thoracic aortic aneurysm.    MRI in 2016 showed that since 2013, his thoracic aortic aneurysm has increased from 4.3 cm to 4.5 cm.   He had some chest pain and was admitted to the hospital in 12/17.  Cath showed: "The left ventricular systolic function is normal. LV end diastolic pressure is normal.   Normal left ventricular function without focal segmental wall motion abnormality.  Ejection fraction is approximately 65%.   Normal coronary arteries in a right dominant coronary circulation."   More CP led to CT scan in early Jan 2018 and thoracic aneurysm was the same size.    His father passed away from CHF in Apr 05, 2023; he had an LVAD.    CT scan in 2018 showed saneurysm size of 4.5 cm.   He was in the ER for HTN in 10/2019.  BP was 180/110. Negative w/u.  BP better since spironolactone started.  At that time, he had trying to walk for exercise.  Over the years, he has had on and off chest discomfort.  It has been atypical and usually it is worse with emotional stress.  Walking would actually help calm him down and his pain will go away.   He had some more chest pain in April 2021.  He had a negative work-up.  He had another emergency room visit in May 2021 for melena, but there was no blood found in his stool.   Diagnosed with DVT in May 2021.  He was started on anticoagulation.  This was his second DVT/PE.  He had one in 2011.  Her has a family h/o blood clots. He saw Dr. Alen Blew in 2011.  Warfarin was stopped at that time.  Started on  Eliquis in 2021.  No bleeding problems.   Admitted for chest pain in 08/2021.  Stress test in 08/2021 was normal.  CT of aorta in 2022 showed stable aorta 4.5 cm.   Walks 30 minutes at a time.  Of late, Denies : Chest pain. Dizziness. Leg edema. Nitroglycerin use. Orthopnea. Palpitations. Paroxysmal nocturnal dyspnea. Shortness of breath. Syncope.     Past Medical History:  Diagnosis Date   ABDOMINAL PAIN-EPIGASTRIC    COAGULOPATHY, COUMADIN-INDUCED    DYSPHAGIA OROPHARYNGEAL PHASE    Heartburn    HYPERLIPIDEMIA    HYPERTENSION    HYPERTENSION NEC    HYPOTHYROIDISM    PE    Primary hypercoagulable state ([place])    Thoracic aneurysm without mention of rupture    Thoracic aortic aneurysm    VASCULITIS     Past Surgical History:  Procedure Laterality Date   CARDIAC CATHETERIZATION N/A 12/08/2016   Procedure: Left Heart Cath and Coronary Angiography;  Surgeon: Troy Sine, MD;  Location: [facility];  Service: Cardiovascular;  Laterality: N/A;   ELBOW SURGERY Left 2010     Current Outpatient Medications  Medication Sig Dispense Refill   amLODipine (NORVASC) 10 MG tablet Take  1 tablet (10 mg total) by mouth daily. 90 tablet 1   apixaban (ELIQUIS) 5 MG TABS tablet Take 1 tablet (5 mg total) by mouth 2 (two) times daily. 180 tablet 2   levothyroxine (SYNTHROID) 200 MCG tablet Take 200 mcg by mouth daily.     metoprolol succinate (TOPROL-XL) 50 MG 24 hr tablet Take 1 tablet (50 mg total) by mouth daily. 90 tablet 1   nitroGLYCERIN (NITROSTAT) 0.4 MG SL tablet Place 1 tablet under the tongue as needed for chest pain.     potassium chloride SA (KLOR-CON) 20 MEQ tablet Take 20 mEq by mouth 3 (three) times daily.     sertraline (ZOLOFT) 100 MG tablet Take 200 mg by mouth daily.      spironolactone (ALDACTONE) 25 MG tablet Take 1 tablet (25 mg total) by mouth daily. 90 tablet 1   valsartan (DIOVAN) 320 MG tablet Take 1 tablet (320 mg total) by mouth daily. 90 tablet 1   No  current facility-administered medications for this visit.    Allergies:   Latex    Social History:  The patient  reports that he has never smoked. He has never used smokeless tobacco. He reports that he does not drink alcohol and does not use drugs.   Family History:  The patient's family history includes Heart disease in his mother; Hypertension in his father.    ROS:  Please see the history of present illness.   Otherwise, review of systems are positive for weight gain.   All other systems are reviewed and negative.    PHYSICAL EXAM: VS:  BP 122/74    Pulse 86    Ht 6\' 3"  (1.905 m)    Wt 283 lb (128.4 kg)    SpO2 98%    BMI 35.37 kg/m  , BMI Body mass index is 35.37 kg/m. GEN: Well nourished, well developed, in no acute distress HEENT: normal Neck: no JVD, carotid bruits, or masses Cardiac: RRR; no murmurs, rubs, or gallops,no edema  Respiratory:  clear to auscultation bilaterally, normal work of breathing GI: soft, nontender, nondistended, + BS, obese MS: no deformity or atrophy Skin: warm and dry, ; rash on arms Neuro:  Strength and sensation are intact Psych: euthymic mood, full affect   EKG:   The ekg ordered today demonstrates NSR, no ST segment   Recent Labs: 08/29/2021: ALT 26 08/30/2021: BUN 12; Creatinine, Ser 1.12; Hemoglobin 14.8; Platelets 206; Potassium 4.4; Sodium 143   Lipid Panel    Component Value Date/Time   CHOL 156 01/24/2020 1131   TRIG 166 (H) 01/24/2020 1131   HDL 44 01/24/2020 1131   CHOLHDL 3.5 01/24/2020 1131   CHOLHDL 3.3 12/07/2016 0247   VLDL 10 12/07/2016 0247   LDLCALC 83 01/24/2020 1131     Other studies Reviewed: Additional studies/ records that were reviewed today with results demonstrating: tests reviewed; labs reviewed, LDL 89,.   ASSESSMENT AND PLAN:  DVT/PE: leg swelling improved.  On ELiquis indefinitely.  No bleeding problems. Bicuspid aortic valve:  Seems to be functioning well.  No signs of aortic stenosis. Aortic  aneurysm: thoracic. Bicuspid aortic valve.  4.5 cm.  Repeat CT scan in April 2023 HTN:  The current medical regimen is effective;  continue present plan and medications.   Morbid Obesity: High fiber diet.  Whole food, plant based diet.  Increase the intensity of walking.     Current medicines are reviewed at length with the patient today.  The patient concerns regarding  his medicines were addressed.  The following changes have been made:  No change  Labs/ tests ordered today include:  No orders of the defined types were placed in this encounter.   Recommend 150 minutes/week of aerobic exercise Low fat, low carb, high fiber diet recommended  Disposition:   FU in 1 year   Signed, Larae Grooms, MD  01/06/2022 4:43 PM    [facility] [place], [place], [place]  [address] Phone: [phone]; Fax: [phone]

## 2022-02-19 ENCOUNTER — Other Ambulatory Visit: Payer: Self-pay | Admitting: Interventional Cardiology

## 2022-02-21 ENCOUNTER — Other Ambulatory Visit: Payer: Self-pay | Admitting: Interventional Cardiology

## 2022-02-23 NOTE — Telephone Encounter (Addendum)
Eliquis 5mg  refill request received. Patient is 56 years old, weight-128.4kg, Crea-1.12 on 9/10/22022, Diagnosis-per last ov note-"DVT/PE: leg swelling improved. On ELiquis indefinitely.", and last seen by Dr. 10/30/2201 on 01/06/2022. Dose is appropriate based on dosing criteria. Will send in refill to requested pharmacy.   ?

## 2022-03-05 ENCOUNTER — Other Ambulatory Visit: Payer: Self-pay

## 2022-03-05 ENCOUNTER — Other Ambulatory Visit: Payer: PRIVATE HEALTH INSURANCE

## 2022-03-05 DIAGNOSIS — I25118 Atherosclerotic heart disease of native coronary artery with other forms of angina pectoris: Secondary | ICD-10-CM

## 2022-03-05 DIAGNOSIS — I7121 Aneurysm of the ascending aorta, without rupture: Secondary | ICD-10-CM

## 2022-03-05 DIAGNOSIS — Q2381 Bicuspid aortic valve: Secondary | ICD-10-CM

## 2022-03-06 LAB — BASIC METABOLIC PANEL
BUN/Creatinine Ratio: 13 (ref 9–20)
BUN: 14 mg/dL (ref 6–24)
CO2: 20 mmol/L (ref 20–29)
Calcium: 9.4 mg/dL (ref 8.7–10.2)
Chloride: 107 mmol/L — ABNORMAL HIGH (ref 96–106)
Creatinine, Ser: 1.05 mg/dL (ref 0.76–1.27)
Glucose: 106 mg/dL — ABNORMAL HIGH (ref 70–99)
Potassium: 4.4 mmol/L (ref 3.5–5.2)
Sodium: 145 mmol/L — ABNORMAL HIGH (ref 134–144)
eGFR: 85 mL/min/{1.73_m2} (ref >59)

## 2022-03-12 ENCOUNTER — Inpatient Hospital Stay: Admission: RE | Admit: 2022-03-12 | Payer: PRIVATE HEALTH INSURANCE | Source: Ambulatory Visit

## 2022-03-14 ENCOUNTER — Other Ambulatory Visit: Payer: Self-pay | Admitting: Interventional Cardiology

## 2022-03-19 ENCOUNTER — Ambulatory Visit
Admission: RE | Admit: 2022-03-19 | Discharge: 2022-03-19 | Disposition: A | Discharge status: Home / Self Care | Payer: PRIVATE HEALTH INSURANCE | Source: Ambulatory Visit | Attending: Interventional Cardiology | Admitting: Interventional Cardiology

## 2022-03-19 DIAGNOSIS — I7121 Aneurysm of the ascending aorta, without rupture: Secondary | ICD-10-CM

## 2022-03-19 DIAGNOSIS — Q231 Congenital insufficiency of aortic valve: Secondary | ICD-10-CM

## 2022-03-19 DIAGNOSIS — Q2381 Bicuspid aortic valve: Secondary | ICD-10-CM

## 2022-03-19 MED ORDER — IOHEXOL 350 MG/ML SOLN: 100.0000 mL | Freq: Once | INTRAVENOUS | Status: DC | PRN

## 2022-03-20 ENCOUNTER — Telehealth: Payer: Self-pay | Admitting: *Deleted

## 2022-03-20 NOTE — Telephone Encounter (Signed)
OK to try Valium 5 mg prior to scan ?

## 2022-03-20 NOTE — Telephone Encounter (Signed)
Received message that pt refused scan while on table due to anxiety.  ?I spoke with patient. He reports feeling very anxious while trying to have CT done.  Has had scans in the past without problem. He reports a similar feeling when at dentist recently.  Also had an episode when hospitalized in 2017 and was given a medication at that time.  Takes Zoloft daily. ?Patient willing to try to have CT if given something for anxiety prior to scan ?

## 2022-03-23 MED ORDER — DIAZEPAM 5 MG PO TABS: ORAL_TABLET | ORAL | 0 refills | Status: DC

## 2022-03-23 NOTE — Telephone Encounter (Signed)
Patient notified.  He would like to proceed with CT Scan.  He is aware he will need someone to drive him.  Prescription called [facility] [place] ?

## 2022-03-31 ENCOUNTER — Other Ambulatory Visit: Payer: PRIVATE HEALTH INSURANCE

## 2022-04-09 ENCOUNTER — Other Ambulatory Visit: Payer: Self-pay | Admitting: Interventional Cardiology

## 2022-08-21 ENCOUNTER — Other Ambulatory Visit: Payer: Self-pay | Admitting: Interventional Cardiology

## 2022-08-21 DIAGNOSIS — Z86711 Personal history of pulmonary embolism: Secondary | ICD-10-CM

## 2022-08-21 NOTE — Telephone Encounter (Signed)
Prescription refill request for Eliquis received. Indication: DVT/PE Last office visit: 01/06/22 [name])  Scr: 1.05 (03/05/22)  Age: 56 Weight: 128.4kg  Appropriate dose and refill sent to requested pharmacy.

## 2023-01-09 ENCOUNTER — Other Ambulatory Visit: Payer: Self-pay | Admitting: Interventional Cardiology

## 2023-02-03 ENCOUNTER — Other Ambulatory Visit: Payer: Self-pay | Admitting: Interventional Cardiology

## 2023-02-18 ENCOUNTER — Other Ambulatory Visit: Payer: Self-pay | Admitting: Interventional Cardiology

## 2023-02-18 DIAGNOSIS — Z86711 Personal history of pulmonary embolism: Secondary | ICD-10-CM

## 2023-02-18 NOTE — Telephone Encounter (Signed)
Called pt to schedule appt with provider. No answer, left message on voicemail.  Message sent to schedulers.

## 2023-02-18 NOTE — Telephone Encounter (Addendum)
Eliquis '5mg'$  refill request received. Patient is 57 years old, weight-128.4kg, Crea-1.05 on 03/05/22, Diagnosis-dvt/pe, and last seen by Dr. Irish Lack on 01/06/22-pt needs an appt. Dose is appropriate based on dosing criteria.   Pt needs an appt.  Pt has an appt pending with Dr. Irish Lack on 02/22/23. Will send in a refill at this time.

## 2023-02-21 NOTE — Progress Notes (Unsigned)
Cardiology Office Note   Date:  02/22/2023   ID:  [name], DOB [birth year], MRN [id number]  PCP:  Lawerance Cruel, MD    No chief complaint on file.  Coronary calcification  Wt Readings from Last 3 Encounters:  02/22/23 286 lb 12.8 oz (130.1 kg)  01/06/22 283 lb (128.4 kg)  09/04/21 259 lb (117.5 kg)       History of Present Illness: [name] is a 57 y.o. male   with a bicuspid aortic valve, and associated thoracic aortic aneurysm.    MRI in 2016 showed that since 2013, his thoracic aortic aneurysm has increased from 4.3 cm to 4.5 cm.   He had some chest pain and was admitted to the hospital in 12/17.  Cath showed: "The left ventricular systolic function is normal. LV end diastolic pressure is normal.   Normal left ventricular function without focal segmental wall motion abnormality.  Ejection fraction is approximately 65%.   Normal coronary arteries in a right dominant coronary circulation."   More CP led to CT scan in early Jan 2018 and thoracic aneurysm was the same size.    His father passed away from CHF in 03/19/23; he had an LVAD.    CT scan in 2018 showed saneurysm size of 4.5 cm.   He was in the ER for HTN in 10/2019.  BP was 180/110. Negative w/u.  BP better since spironolactone started.  At that time, he had trying to walk for exercise.  Over the years, he has had on and off chest discomfort.  It has been atypical and usually it is worse with emotional stress.  Walking would actually help calm him down and his pain will go away.   He had some more chest pain in April 2021.  He had a negative work-up.  He had another emergency room visit in May 2021 for melena, but there was no blood found in his stool.   Diagnosed with DVT in May 2021.  He was started on anticoagulation.  This was his second DVT/PE.  He had one in 2011.  Her has a family h/o blood clots. He saw Dr. Alen Blew in 2011.  Warfarin was stopped at that time.   Started on Eliquis in  2021.  No bleeding problems.    Admitted for chest pain in 08/2021.  Stress test in 08/2021 was normal.  CT of aorta in 2022 showed stable aorta 4.5 cm.    Of late, skin issues have worsened.    BP has been labile, highest 160s, typically in the [id number] systolic.  Denies : Chest pain. Dizziness. Leg edema. Nitroglycerin use. Orthopnea. Palpitations. Paroxysmal nocturnal dyspnea. Shortness of breath. Syncope.    Walks a dog twice a day, 15 minutes each.   His Mom has had issues with cellulitis.     Past Medical History:  Diagnosis Date   ABDOMINAL PAIN-EPIGASTRIC    COAGULOPATHY, COUMADIN-INDUCED    DYSPHAGIA OROPHARYNGEAL PHASE    Heartburn    HYPERLIPIDEMIA    HYPERTENSION    HYPERTENSION NEC    HYPOTHYROIDISM    PE    Primary hypercoagulable state ([place])    Thoracic aneurysm without mention of rupture    Thoracic aortic aneurysm ([place])    VASCULITIS     Past Surgical History:  Procedure Laterality Date   CARDIAC CATHETERIZATION N/A 12/08/2016   Procedure: Left Heart Cath and Coronary Angiography;  Surgeon: Troy Sine, MD;  Location: [facility];  Service: Cardiovascular;  Laterality: N/A;   ELBOW SURGERY Left 2010     Current Outpatient Medications  Medication Sig Dispense Refill   amLODipine (NORVASC) 10 MG tablet TAKE 1 TABLET BY MOUTH EVERY DAY 90 tablet 3   apixaban (ELIQUIS) 5 MG TABS tablet TAKE 1 TABLET BY MOUTH TWICE A DAY 60 tablet 1   diazepam (VALIUM) 5 MG tablet Take one tablet by mouth one hour prior to CT Scan 1 tablet 0   levothyroxine (SYNTHROID) 112 MCG tablet Take 224 mcg by mouth every morning.     metoprolol succinate (TOPROL-XL) 50 MG 24 hr tablet TAKE 1 TABLET BY MOUTH EVERY DAY 90 tablet 3   nitroGLYCERIN (NITROSTAT) 0.4 MG SL tablet Place 1 tablet under the tongue as needed for chest pain.     potassium chloride SA (KLOR-CON) 20 MEQ tablet Take 20 mEq by mouth 3 (three) times daily.     sertraline (ZOLOFT) 100 MG tablet Take 200 mg by  mouth daily.      spironolactone (ALDACTONE) 25 MG tablet TAKE 1 TABLET (25 MG TOTAL) BY MOUTH DAILY. 90 tablet 1   valsartan (DIOVAN) 320 MG tablet Take 1 tablet (320 mg total) by mouth daily. Please call [phone] to schedule an appointment for future refills. Thank you. 15 tablet 0   No current facility-administered medications for this visit.    Allergies:   Latex    Social History:  The patient  reports that he has never smoked. He has never used smokeless tobacco. He reports that he does not drink alcohol and does not use drugs.   Family History:  The patient's family history includes Heart disease in his mother; Hypertension in his father.    ROS:  Please see the history of present illness.   Otherwise, review of systems are positive for worsening rash- trying to see Dermatology.   All other systems are reviewed and negative.    PHYSICAL EXAM: VS:  BP 112/80   Pulse 79   Ht '6\' 3"'$  (1.905 m)   Wt 286 lb 12.8 oz (130.1 kg)   SpO2 94%   BMI 35.85 kg/m  , BMI Body mass index is 35.85 kg/m. GEN: Well nourished, well developed, in no acute distress HEENT: normal Neck: no JVD, carotid bruits, or masses Cardiac: RRR; no murmurs, rubs, or gallops,no edema  Respiratory:  clear to auscultation bilaterally, normal work of breathing GI: soft, nontender, nondistended, + BS MS: no deformity or atrophy Skin: warm and dry, no rash Neuro:  Strength and sensation are intact Psych: euthymic mood, full affect   EKG:   The ekg ordered today demonstrates normal ECG   Recent Labs: 03/05/2022: BUN 14; Creatinine, Ser 1.05; Potassium 4.4; Sodium 145   Lipid Panel    Component Value Date/Time   CHOL 156 01/24/2020 1131   TRIG 166 (H) 01/24/2020 1131   HDL 44 01/24/2020 1131   CHOLHDL 3.5 01/24/2020 1131   CHOLHDL 3.3 12/07/2016 0247   VLDL 10 12/07/2016 0247   LDLCALC 83 01/24/2020 1131     Other studies Reviewed: Additional studies/ records that were reviewed today with  results demonstrating: labs reviewed December 2023 total cholesterol 178 HDL 43 LDL 107 triglycerides 162 creatinine 1.1 normal liver test.  Cr 1.13 in 12/23   ASSESSMENT AND PLAN:  DVT/PE: Indefinite Eliquis.  No bleeding problems.  Check CBC and TSH.  Synthroid was adjusted. Bicuspid aortic valve: stable.  No CHF.   CAD: coronary calcification noted.  No angina.  Thoracic aortic aneurysm: stable. MIld aortic atherosclerosis noted on prior CT.  HTN: The current medical regimen is effective;  continue present plan and medications.   obesity: Whole food, plant based diet. Increase intensity of walking.     Current medicines are reviewed at length with the patient today.  The patient concerns regarding his medicines were addressed.  The following changes have been made:  No change  Labs/ tests ordered today include: CBC, TSH,  No orders of the defined types were placed in this encounter.   Recommend 150 minutes/week of aerobic exercise Low fat, low carb, high fiber diet recommended  Disposition:   FU in 1 year   Signed, Larae Grooms, MD  02/22/2023 10:55 AM    [facility] [place], [place], [place]  [address] Phone: [phone]; Fax: [phone]

## 2023-02-22 ENCOUNTER — Ambulatory Visit: Payer: Medicaid Other | Attending: Interventional Cardiology | Admitting: Interventional Cardiology

## 2023-02-22 ENCOUNTER — Encounter: Payer: Self-pay | Admitting: Interventional Cardiology

## 2023-02-22 ENCOUNTER — Other Ambulatory Visit: Payer: Self-pay | Admitting: *Deleted

## 2023-02-22 VITALS — BP 112/80 | HR 79 | Ht 75.0 in | Wt 286.8 lb

## 2023-02-22 DIAGNOSIS — I25118 Atherosclerotic heart disease of native coronary artery with other forms of angina pectoris: Secondary | ICD-10-CM

## 2023-02-22 DIAGNOSIS — Q231 Congenital insufficiency of aortic valve: Secondary | ICD-10-CM | POA: Diagnosis not present

## 2023-02-22 DIAGNOSIS — I7121 Aneurysm of the ascending aorta, without rupture: Secondary | ICD-10-CM

## 2023-02-22 DIAGNOSIS — E669 Obesity, unspecified: Secondary | ICD-10-CM

## 2023-02-22 DIAGNOSIS — Z86711 Personal history of pulmonary embolism: Secondary | ICD-10-CM

## 2023-02-22 DIAGNOSIS — I10 Essential (primary) hypertension: Secondary | ICD-10-CM

## 2023-02-22 LAB — CBC

## 2023-02-22 MED ORDER — APIXABAN 5 MG PO TABS: 5.0000 mg | ORAL_TABLET | Freq: Two times a day (BID) | ORAL | 5 refills | Status: DC

## 2023-02-22 NOTE — Patient Instructions (Signed)
Medication Instructions:  Your physician recommends that you continue on your current medications as directed. Please refer to the Current Medication list given to you today.  *If you need a refill on your cardiac medications before your next appointment, please call your pharmacy*   Lab Work: Lab work to be done today--CBC and TSH If you have labs (blood work) drawn today and your tests are completely normal, you will receive your results only by: [place] (if you have MyChart) OR A paper copy in the mail If you have any lab test that is abnormal or we need to change your treatment, we will call you to review the results.   Testing/Procedures: none   Follow-Up: At [facility], you and your health needs are our priority.  As part of our continuing mission to provide you with exceptional heart care, we have created designated Provider Care Teams.  These Care Teams include your primary Cardiologist (physician) and Advanced Practice Providers (APPs -  Physician Assistants and Nurse Practitioners) who all work together to provide you with the care you need, when you need it.  We recommend signing up for the patient portal called "MyChart".  Sign up information is provided on this After Visit Summary.  MyChart is used to connect with patients for Virtual Visits (Telemedicine).  Patients are able to view lab/test results, encounter notes, upcoming appointments, etc.  Non-urgent messages can be sent to your provider as well.   To learn more about what you can do with MyChart, go to [email or web address].    Your next appointment:   12 month(s)  Provider:   Larae Grooms, MD     Other Instructions

## 2023-02-22 NOTE — Telephone Encounter (Signed)
Eliquis '5mg'$  refill request received. Patient is 57 years old, weight-130.1kg, Crea-1.13 on 11/30/2022 via [facility] from [place], Diagnosis-DVT/PE, and last seen by Dr. Irish Lack on 02/22/23. Dose is appropriate based on dosing criteria. Will send in refill to requested pharmacy.

## 2023-02-23 LAB — CBC
Hematocrit: 50.3 % (ref 37.5–51.0)
Hemoglobin: 16.8 g/dL (ref 13.0–17.7)
MCH: 32.1 pg (ref 26.6–33.0)
MCHC: 33.4 g/dL (ref 31.5–35.7)
MCV: 96 fL (ref 79–97)
Platelets: 246 10*3/uL (ref 150–450)
RBC: 5.23 x10E6/uL (ref 4.14–5.80)
RDW: 12.8 % (ref 11.6–15.4)
WBC: 6.8 10*3/uL (ref 3.4–10.8)

## 2023-02-23 LAB — TSH: TSH: 8.35 u[IU]/mL — ABNORMAL HIGH (ref 0.450–4.500)

## 2023-03-01 ENCOUNTER — Other Ambulatory Visit: Payer: Self-pay | Admitting: Interventional Cardiology

## 2023-03-20 ENCOUNTER — Other Ambulatory Visit: Payer: Self-pay | Admitting: Interventional Cardiology

## 2023-03-25 ENCOUNTER — Ambulatory Visit (INDEPENDENT_AMBULATORY_CARE_PROVIDER_SITE_OTHER): Payer: Medicaid Other | Admitting: Dermatology

## 2023-03-25 ENCOUNTER — Encounter: Payer: Self-pay | Admitting: Dermatology

## 2023-03-25 DIAGNOSIS — Q828 Other specified congenital malformations of skin: Secondary | ICD-10-CM | POA: Diagnosis not present

## 2023-03-25 MED ORDER — TACROLIMUS 0.1 % EX OINT: TOPICAL_OINTMENT | Freq: Two times a day (BID) | CUTANEOUS | 0 refills | Status: DC

## 2023-03-25 MED ORDER — TRIAMCINOLONE ACETONIDE 0.1 % EX CREA: TOPICAL_CREAM | Freq: Two times a day (BID) | CUTANEOUS | 2 refills | Status: DC

## 2023-03-25 MED ORDER — CLINDAMYCIN PHOSPHATE 1 % EX LOTN: TOPICAL_LOTION | Freq: Every day | CUTANEOUS | 2 refills | Status: AC

## 2023-03-25 MED ORDER — METHYLPREDNISOLONE 4 MG PO TBPK: ORAL_TABLET | ORAL | 0 refills | Status: DC

## 2023-03-25 NOTE — Patient Instructions (Signed)
Treatment: -Refill Triamcinolone Silver Sulfadiazine to use twice daily for up to two weeks alternating with Tacrolimus -Prescription Tacrolimus ointment to use twice daily for up to two weeks alternating with Triamcinolone -Recommendation of using Dove antibacterial soap -Prescription Clindamycin lotion to use daily -Start Prednisone Taper pack  Due to recent changes in healthcare laws, you may see results of your pathology and/or laboratory studies on MyChart before the doctors have had a chance to review them. We understand that in some cases there may be results that are confusing or concerning to you. Please understand that not all results are received at the same time and often the doctors may need to interpret multiple results in order to provide you with the best plan of care or course of treatment. Therefore, we ask that you please give [place] 2 business days to thoroughly review all your results before contacting the office for clarification. Should we see a critical lab result, you will be contacted sooner.   If You Need Anything After Your Visit  If you have any questions or concerns for your doctor, please call our main line at [phone] If no one answers, please leave a voicemail as directed and we will return your call as soon as possible. Messages left after 4 pm will be answered the following business day.   You may also send [place] a message via [place]. We typically respond to MyChart messages within 1-2 business days.  For prescription refills, please ask your pharmacy to contact our office. Our fax number is [phone].  If you have an urgent issue when the clinic is closed that cannot wait until the next business day, you can page your doctor at the number below.    Please note that while we do our best to be available for urgent issues outside of office hours, we are not available 24/7.   If you have an urgent issue and are unable to reach [place], you may choose to seek medical  care at your doctor's office, retail clinic, urgent care center, or emergency room.  If you have a medical emergency, please immediately call 911 or go to the emergency department. In the event of inclement weather, please call our main line at [phone] for an update on the status of any delays or closures.  Dermatology Medication Tips: Please keep the boxes that topical medications come in in order to help keep track of the instructions about where and how to use these. Pharmacies typically print the medication instructions only on the boxes and not directly on the medication tubes.   If your medication is too expensive, please contact our office at [phone] or send [place] a message through [place].   We are unable to tell what your co-pay for medications will be in advance as this is different depending on your insurance coverage. However, we may be able to find a substitute medication at lower cost or fill out paperwork to get insurance to cover a needed medication.   If a prior authorization is required to get your medication covered by your insurance company, please allow [place] 1-2 business days to complete this process.  Drug prices often vary depending on where the prescription is filled and some pharmacies may offer cheaper prices.  The website www.goodrx.com contains coupons for medications through different pharmacies. The prices here do not account for what the cost may be with help from insurance (it may be cheaper with your insurance), but the website can give you the price if  you did not use any insurance.  - You can print the associated coupon and take it with your prescription to the pharmacy.  - You may also stop by our office during regular business hours and pick up a GoodRx coupon card.  - If you need your prescription sent electronically to a different pharmacy, notify our office through [facility] or by phone at [phone]

## 2023-03-25 NOTE — Progress Notes (Signed)
   New Patient Visit  Subjective  [name] is a 57 y.o. male who presents for the following: No chief complaint on file..  Patient was diagnosed with Hailey Hailey Disease in 2000. Previous treatments include: Botox, Prednisone injections and oral, and Triamcinolone Silver Sulfate which worked the best, Clindamycin. With the various treatments, the rash will go away for about a month but come back. Right now the left leg burns and the back is itchy. Not using any treatments at this besides OTC Cortisone 10 but not working. Last dermatology appointment was October of 2022. Still getting new spots. Friction is the biggest trigger.   Objective  Well appearing patient in no apparent distress; mood and affect are within normal limits.  A focused examination was performed including back, legs, arms, and chest. Relevant physical exam findings are noted in the Assessment and Plan.   Assessment & Plan  Hailey Hailey Disease  I counseled the patient on the following: - Skin care: Hailey Hailey can be treated withTake bleach baths twice weekly reduce superficial infections.  Apply zinc paste to inflamed patches, Use antiperspirants such as roll-on and cream forms of aluminium salts  -Medical Treatments: Corticosteroids,Topical antibiotics, Benzoyl peroxide,  Ketoconazole cream, Calcipotriol cream, Topical calcineurin inhibitors   -Expectations: [name] is a genetic disorder caused by a mutation in the ATP2C1 gene which impairs skin cells from "sticking together"  There is no cure however we can manage symptoms with combinations of the treatments listed above.   Treatment: -Refill Triamcinolone Silver Sulfadiazine to use twice daily for up to two weeks alternating with Tacrolimus -Prescription Tacrolimus ointment to use twice daily for up to two weeks alternating with Triamcinolone -Discussed recommendation of using Dove antibacterial soap -Prescription Clindamycin lotion to use  daily -Start Prednisone Taper pack  No follow-ups on file.  I, Zigmund Gottron, CMA, am acting as scribe for Ellard Artis, MD.  Documentation: I have reviewed the above documentation for accuracy and completeness, and I agree with the above  Marcus Pena, DO

## 2023-05-06 ENCOUNTER — Encounter: Payer: Self-pay | Admitting: Dermatology

## 2023-05-06 ENCOUNTER — Ambulatory Visit (INDEPENDENT_AMBULATORY_CARE_PROVIDER_SITE_OTHER): Payer: Medicaid Other | Admitting: Dermatology

## 2023-05-06 VITALS — BP 119/73

## 2023-05-06 DIAGNOSIS — Q828 Other specified congenital malformations of skin: Secondary | ICD-10-CM | POA: Diagnosis not present

## 2023-05-06 MED ORDER — PREDNISONE 10 MG PO TABS: ORAL_TABLET | ORAL | 0 refills | Status: DC

## 2023-05-06 MED ORDER — CLOBETASOL PROPIONATE 0.05 % EX CREA: 1.0000 | TOPICAL_CREAM | Freq: Two times a day (BID) | CUTANEOUS | 2 refills | Status: DC

## 2023-05-06 MED ORDER — SILVER SULFADIAZINE 1 % EX CREA: 1.0000 | TOPICAL_CREAM | Freq: Two times a day (BID) | CUTANEOUS | 1 refills | Status: DC

## 2023-05-06 NOTE — Progress Notes (Signed)
   Follow-Up Visit   Subjective  [name] is a 57 y.o. 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 He was not able to get compounded Triamcinolone Silver Sulfadiazine. He is using Tacrolimus which he says did not help. He was not given Triamcinolone separately. He says symptoms are worse. Itch is an 8 on itch scale. He is using Clindamycin lotion daily. He thinks the Prednisone was a little helpful. He is using Safeguard soap. He wants to discuss longer use of Prednisone.  The following portions of the chart were reviewed this encounter and updated as appropriate: medications, allergies, medical history  Review of Systems:  No other skin or systemic complaints except as noted in HPI or Assessment and Plan.  Objective  Well appearing patient in no apparent distress; mood and affect are within normal limits.    A focused examination was performed of the following areas: Arms, Torso, legs  Relevant exam findings are noted in the Assessment and Plan.    Assessment & Plan   [name] Disease Exam: weeping superficial erosions involving intertriginous areas of axillae, popliteal fossa, and areas of friction on torso.     Treatment Plan: Pt is currently very flared as the warm weather is a trigger.  Will start a Prednisone taper  today  Pharmacy was unable to fill rx for compounded mixture of Triamcinolone /Silvadine compound so we will send a new rx for clobetasol and silvadene separately and he will mix them together.     Discussed precautions of long term Prednisone use. Discussed monitoring blood pressure while taken.  Advised looser fitting clothing Dove antibacterial soap for daily cleansing  Return in about 2 months (around 07/06/2023) for [name], Rash Follow Up.  Jaclynn Guarneri, CMA, am acting as scribe for [employer], DO.   Documentation: I have reviewed the above documentation for accuracy and  completeness, and I agree with the above.  [name], DO

## 2023-05-06 NOTE — Patient Instructions (Addendum)
Discussed monitoring blood pressure while taken.  Advised looser fitting clothing Dove antibacterial soap for daily cleansing   Due to recent changes in healthcare laws, you may see results of your pathology and/or laboratory studies on MyChart before the doctors have had a chance to review them. We understand that in some cases there may be results that are confusing or concerning to you. Please understand that not all results are received at the same time and often the doctors may need to interpret multiple results in order to provide you with the best plan of care or course of treatment. Therefore, we ask that you please give [place] 2 business days to thoroughly review all your results before contacting the office for clarification. Should we see a critical lab result, you will be contacted sooner.   If You Need Anything After Your Visit  If you have any questions or concerns for your doctor, please call our main line at [phone] If no one answers, please leave a voicemail as directed and we will return your call as soon as possible. Messages left after 4 pm will be answered the following business day.   You may also send [place] a message via MyChart. We typically respond to MyChart messages within 1-2 business days.  For prescription refills, please ask your pharmacy to contact our office. Our fax number is [phone].  If you have an urgent issue when the clinic is closed that cannot wait until the next business day, you can page your doctor at the number below.    Please note that while we do our best to be available for urgent issues outside of office hours, we are not available 24/7.   If you have an urgent issue and are unable to reach [place], you may choose to seek medical care at your doctor's office, retail clinic, urgent care center, or emergency room.  If you have a medical emergency, please immediately call 911 or go to the emergency department. In the event of inclement  weather, please call our main line at [phone] for an update on the status of any delays or closures.  Dermatology Medication Tips: Please keep the boxes that topical medications come in in order to help keep track of the instructions about where and how to use these. Pharmacies typically print the medication instructions only on the boxes and not directly on the medication tubes.   If your medication is too expensive, please contact our office at [phone] or send [place] a message through MyChart.   We are unable to tell what your co-pay for medications will be in advance as this is different depending on your insurance coverage. However, we may be able to find a substitute medication at lower cost or fill out paperwork to get insurance to cover a needed medication.   If a prior authorization is required to get your medication covered by your insurance company, please allow [place] 1-2 business days to complete this process.  Drug prices often vary depending on where the prescription is filled and some pharmacies may offer cheaper prices.  The website www.goodrx.com contains coupons for medications through different pharmacies. The prices here do not account for what the cost may be with help from insurance (it may be cheaper with your insurance), but the website can give you the price if you did not use any insurance.  - You can print the associated coupon and take it with your prescription to the pharmacy.  - You may also  stop by our office during regular business hours and pick up a GoodRx coupon card.  - If you need your prescription sent electronically to a different pharmacy, notify our office through [facility] or by phone at [phone]

## 2023-05-11 ENCOUNTER — Other Ambulatory Visit: Payer: Self-pay | Admitting: Interventional Cardiology

## 2023-05-26 ENCOUNTER — Emergency Department (HOSPITAL_COMMUNITY): Payer: Medicaid Other

## 2023-05-26 ENCOUNTER — Observation Stay (HOSPITAL_COMMUNITY)
Admission: EM | Admit: 2023-05-26 | Discharge: 2023-05-28 | Disposition: A | Discharge status: Home / Self Care | Payer: Medicaid Other | Attending: Internal Medicine | Admitting: Internal Medicine

## 2023-05-26 ENCOUNTER — Encounter (HOSPITAL_COMMUNITY): Payer: Self-pay

## 2023-05-26 ENCOUNTER — Other Ambulatory Visit: Payer: Self-pay

## 2023-05-26 DIAGNOSIS — R55 Syncope and collapse: Secondary | ICD-10-CM

## 2023-05-26 DIAGNOSIS — W19XXXA Unspecified fall, initial encounter: Secondary | ICD-10-CM | POA: Diagnosis not present

## 2023-05-26 DIAGNOSIS — S2249XA Multiple fractures of ribs, unspecified side, initial encounter for closed fracture: Secondary | ICD-10-CM | POA: Diagnosis present

## 2023-05-26 DIAGNOSIS — Z91040 Latex allergy status: Secondary | ICD-10-CM | POA: Insufficient documentation

## 2023-05-26 DIAGNOSIS — Z86718 Personal history of other venous thrombosis and embolism: Secondary | ICD-10-CM | POA: Insufficient documentation

## 2023-05-26 DIAGNOSIS — Z7901 Long term (current) use of anticoagulants: Secondary | ICD-10-CM | POA: Diagnosis not present

## 2023-05-26 DIAGNOSIS — Z79899 Other long term (current) drug therapy: Secondary | ICD-10-CM | POA: Diagnosis not present

## 2023-05-26 DIAGNOSIS — E039 Hypothyroidism, unspecified: Secondary | ICD-10-CM | POA: Diagnosis not present

## 2023-05-26 DIAGNOSIS — S2241XA Multiple fractures of ribs, right side, initial encounter for closed fracture: Secondary | ICD-10-CM | POA: Diagnosis not present

## 2023-05-26 DIAGNOSIS — R001 Bradycardia, unspecified: Secondary | ICD-10-CM | POA: Diagnosis not present

## 2023-05-26 DIAGNOSIS — Z86711 Personal history of pulmonary embolism: Secondary | ICD-10-CM | POA: Diagnosis not present

## 2023-05-26 DIAGNOSIS — I7121 Aneurysm of the ascending aorta, without rupture: Secondary | ICD-10-CM

## 2023-05-26 DIAGNOSIS — I712 Thoracic aortic aneurysm, without rupture, unspecified: Secondary | ICD-10-CM | POA: Diagnosis present

## 2023-05-26 DIAGNOSIS — N179 Acute kidney failure, unspecified: Secondary | ICD-10-CM | POA: Diagnosis present

## 2023-05-26 DIAGNOSIS — I10 Essential (primary) hypertension: Secondary | ICD-10-CM | POA: Diagnosis not present

## 2023-05-26 DIAGNOSIS — E034 Atrophy of thyroid (acquired): Secondary | ICD-10-CM

## 2023-05-26 DIAGNOSIS — I2510 Atherosclerotic heart disease of native coronary artery without angina pectoris: Secondary | ICD-10-CM | POA: Diagnosis present

## 2023-05-26 HISTORY — DX: Syncope and collapse: R55

## 2023-05-26 LAB — I-STAT CHEM 8, ED
BUN: 19 mg/dL (ref 6–20)
Calcium, Ion: 1.12 mmol/L — ABNORMAL LOW (ref 1.15–1.40)
Chloride: 105 mmol/L (ref 98–111)
Creatinine, Ser: 1.5 mg/dL — ABNORMAL HIGH (ref 0.61–1.24)
Glucose, Bld: 102 mg/dL — ABNORMAL HIGH (ref 70–99)
HCT: 49 % (ref 39.0–52.0)
Hemoglobin: 16.7 g/dL (ref 13.0–17.0)
Potassium: 3.7 mmol/L (ref 3.5–5.1)
Sodium: 141 mmol/L (ref 135–145)
TCO2: 25 mmol/L (ref 22–32)

## 2023-05-26 LAB — COMPREHENSIVE METABOLIC PANEL
ALT: 21 U/L (ref 0–44)
AST: 28 U/L (ref 15–41)
Albumin: 3.4 g/dL — ABNORMAL LOW (ref 3.5–5.0)
Alkaline Phosphatase: 54 U/L (ref 38–126)
Anion gap: 12 (ref 5–15)
BUN: 19 mg/dL (ref 6–20)
CO2: 21 mmol/L — ABNORMAL LOW (ref 22–32)
Calcium: 9.1 mg/dL (ref 8.9–10.3)
Chloride: 106 mmol/L (ref 98–111)
Creatinine, Ser: 1.55 mg/dL — ABNORMAL HIGH (ref 0.61–1.24)
GFR, Estimated: 53 mL/min — ABNORMAL LOW (ref >60)
Glucose, Bld: 100 mg/dL — ABNORMAL HIGH (ref 70–99)
Potassium: 3.9 mmol/L (ref 3.5–5.1)
Sodium: 139 mmol/L (ref 135–145)
Total Bilirubin: 1 mg/dL (ref 0.3–1.2)
Total Protein: 6.5 g/dL (ref 6.5–8.1)

## 2023-05-26 LAB — CBC
HCT: 50 % (ref 39.0–52.0)
Hemoglobin: 16.5 g/dL (ref 13.0–17.0)
MCH: 32.2 pg (ref 26.0–34.0)
MCHC: 33 g/dL (ref 30.0–36.0)
MCV: 97.7 fL (ref 80.0–100.0)
Platelets: 214 10*3/uL (ref 150–400)
RBC: 5.12 MIL/uL (ref 4.22–5.81)
RDW: 13.1 % (ref 11.5–15.5)
WBC: 16.1 10*3/uL — ABNORMAL HIGH (ref 4.0–10.5)
nRBC: 0 % (ref 0.0–0.2)

## 2023-05-26 LAB — TROPONIN I (HIGH SENSITIVITY)
Troponin I (High Sensitivity): 8 ng/L (ref <18)
Troponin I (High Sensitivity): 5 ng/L (ref <18)

## 2023-05-26 LAB — ETHANOL: Alcohol, Ethyl (B): <10 mg/dL (ref <10)

## 2023-05-26 LAB — LACTIC ACID, PLASMA
Lactic Acid, Venous: 2.6 mmol/L (ref 0.5–1.9)
Lactic Acid, Venous: 1.9 mmol/L (ref 0.5–1.9)

## 2023-05-26 LAB — PROTIME-INR
INR: 1.3 — ABNORMAL HIGH (ref 0.8–1.2)
Prothrombin Time: 16 seconds — ABNORMAL HIGH (ref 11.4–15.2)

## 2023-05-26 LAB — CK: Total CK: 80 U/L (ref 49–397)

## 2023-05-26 MED ORDER — FENTANYL CITRATE PF 50 MCG/ML IJ SOSY
100.0000 ug | PREFILLED_SYRINGE | Freq: Once | INTRAMUSCULAR | Status: AC
  Administered 2023-05-26: 100 ug via INTRAVENOUS
  Filled 2023-05-26: qty 2

## 2023-05-26 MED ORDER — KETOROLAC TROMETHAMINE 15 MG/ML IJ SOLN
15.0000 mg | Freq: Once | INTRAMUSCULAR | Status: AC
  Administered 2023-05-26: 15 mg via INTRAVENOUS
  Filled 2023-05-26: qty 1

## 2023-05-26 MED ORDER — SODIUM CHLORIDE 0.9 % IV SOLN: 500.0000 mg | Freq: Once | INTRAVENOUS | Status: DC

## 2023-05-26 MED ORDER — ONDANSETRON HCL 4 MG/2ML IJ SOLN: 4.0000 mg | Freq: Four times a day (QID) | INTRAMUSCULAR | Status: DC | PRN

## 2023-05-26 MED ORDER — SODIUM CHLORIDE 0.9 % IV SOLN
2.0000 g | Freq: Once | INTRAVENOUS | Status: DC
Start: 2023-05-26 — End: 2023-05-26

## 2023-05-26 MED ORDER — METHOCARBAMOL 500 MG PO TABS
500.0000 mg | ORAL_TABLET | Freq: Three times a day (TID) | ORAL | Status: DC
  Administered 2023-05-26 – 2023-05-28 (×5): 500 mg via ORAL
  Filled 2023-05-26 (×5): qty 1

## 2023-05-26 MED ORDER — LEVOTHYROXINE SODIUM 112 MCG PO TABS
224.0000 ug | ORAL_TABLET | Freq: Every morning | ORAL | Status: DC
  Administered 2023-05-27 – 2023-05-28 (×2): 224 ug via ORAL
  Filled 2023-05-26 (×2): qty 2

## 2023-05-26 MED ORDER — ONDANSETRON HCL 4 MG PO TABS: 4.0000 mg | ORAL_TABLET | Freq: Four times a day (QID) | ORAL | Status: DC | PRN

## 2023-05-26 MED ORDER — SODIUM CHLORIDE 0.9 % IV BOLUS (SEPSIS)
1500.0000 mL | Freq: Once | INTRAVENOUS | Status: AC
  Administered 2023-05-26: 1500 mL via INTRAVENOUS

## 2023-05-26 MED ORDER — FENTANYL CITRATE PF 50 MCG/ML IJ SOSY: 50.0000 ug | PREFILLED_SYRINGE | INTRAMUSCULAR | Status: DC | PRN

## 2023-05-26 MED ORDER — ACETAMINOPHEN 325 MG PO TABS
650.0000 mg | ORAL_TABLET | Freq: Four times a day (QID) | ORAL | Status: DC
  Administered 2023-05-26 – 2023-05-28 (×6): 650 mg via ORAL
  Filled 2023-05-26 (×6): qty 2

## 2023-05-26 MED ORDER — POLYETHYLENE GLYCOL 3350 17 G PO PACK
17.0000 g | PACK | Freq: Every day | ORAL | Status: DC | PRN
  Filled 2023-05-26: qty 1

## 2023-05-26 MED ORDER — SODIUM CHLORIDE 0.9 % IV SOLN
500.0000 mg | INTRAVENOUS | Status: DC
  Filled 2023-05-26: qty 5

## 2023-05-26 MED ORDER — IOHEXOL 350 MG/ML SOLN
75.0000 mL | Freq: Once | INTRAVENOUS | Status: AC | PRN
  Administered 2023-05-26: 75 mL via INTRAVENOUS

## 2023-05-26 MED ORDER — SODIUM CHLORIDE 0.9 % IV SOLN
2.0000 g | INTRAVENOUS | Status: DC
  Administered 2023-05-26: 2 g via INTRAVENOUS
  Filled 2023-05-26: qty 20

## 2023-05-26 MED ORDER — SENNA 8.6 MG PO TABS
1.0000 | ORAL_TABLET | Freq: Two times a day (BID) | ORAL | Status: DC
  Administered 2023-05-26 – 2023-05-28 (×4): 8.6 mg via ORAL
  Filled 2023-05-26 (×4): qty 1

## 2023-05-26 MED ORDER — METOPROLOL SUCCINATE ER 50 MG PO TB24
50.0000 mg | ORAL_TABLET | Freq: Every day | ORAL | Status: DC
  Administered 2023-05-27: 50 mg via ORAL
  Filled 2023-05-26: qty 1

## 2023-05-26 MED ORDER — SODIUM CHLORIDE 0.9 % IV SOLN: INTRAVENOUS | Status: AC

## 2023-05-26 MED ORDER — SODIUM CHLORIDE 0.9% FLUSH
3.0000 mL | Freq: Two times a day (BID) | INTRAVENOUS | Status: DC
  Administered 2023-05-27 – 2023-05-28 (×3): 3 mL via INTRAVENOUS

## 2023-05-26 MED ORDER — OXYCODONE HCL 5 MG PO TABS
5.0000 mg | ORAL_TABLET | ORAL | Status: DC | PRN
  Administered 2023-05-27 – 2023-05-28 (×5): 5 mg via ORAL
  Filled 2023-05-26 (×5): qty 1

## 2023-05-26 MED ORDER — APIXABAN 5 MG PO TABS
5.0000 mg | ORAL_TABLET | Freq: Two times a day (BID) | ORAL | Status: DC
  Administered 2023-05-26 – 2023-05-28 (×4): 5 mg via ORAL
  Filled 2023-05-26 (×4): qty 1

## 2023-05-26 MED ORDER — ACETAMINOPHEN 650 MG RE SUPP: 650.0000 mg | Freq: Four times a day (QID) | RECTAL | Status: DC | PRN

## 2023-05-26 MED ORDER — FENTANYL CITRATE PF 50 MCG/ML IJ SOSY
50.0000 ug | PREFILLED_SYRINGE | Freq: Once | INTRAMUSCULAR | Status: AC
  Administered 2023-05-26: 50 ug via INTRAVENOUS
  Filled 2023-05-26: qty 1

## 2023-05-26 MED ORDER — LACTATED RINGERS IV BOLUS: 500.0000 mL | Freq: Once | INTRAVENOUS | Status: DC

## 2023-05-26 MED ORDER — HYDROMORPHONE HCL 1 MG/ML IJ SOLN: 0.5000 mg | INTRAMUSCULAR | Status: DC | PRN

## 2023-05-26 MED ORDER — SERTRALINE HCL 100 MG PO TABS
200.0000 mg | ORAL_TABLET | Freq: Every day | ORAL | Status: DC
  Administered 2023-05-27 – 2023-05-28 (×2): 200 mg via ORAL
  Filled 2023-05-26 (×2): qty 2

## 2023-05-26 MED ORDER — ACETAMINOPHEN 325 MG PO TABS: 650.0000 mg | ORAL_TABLET | Freq: Four times a day (QID) | ORAL | Status: DC | PRN

## 2023-05-26 NOTE — ED Notes (Signed)
Pt transported to CT w/ RN 

## 2023-05-26 NOTE — ED Notes (Signed)
Pt returned from CTDate and time results received: 05/26/23 1757  (use smartphrase ".now" to insert current time)  Test: lactic acid Critical Value: 2.6  Name of Provider Notified: dixon

## 2023-05-26 NOTE — H&P (Signed)
History and Physical    [name] GNF:[id number] DOB: 1966/06/09 DOA: 05/26/2023  PCP: Daisy Floro, MD   Patient coming from: Home   Chief Complaint: Syncope   HPI: [name] is a 57 y.o. male with medical history significant for hypertension, hyperlipidemia, hypothyroidism, CAD, and ascending thoracic aortic aneurysm who presents to the emergency department following a syncopal event.  Patient reports he been in his usual state of health and was just returning home from a trip to [name] with his mother when he had a brief loss of consciousness.  Patient states that he was just getting out of his car back at home when he lost consciousness without any warning.  He has had some lightheadedness upon standing for a couple years but it had not seem to be worse recently.  He has not been experiencing any chest pain or palpitations.  He has pain in his right flank area with deep breath or movement, but feels well when remaining still.  He denies headache or focal numbness or weakness.  ED Course: Upon arrival to the ED, patient is found to be afebrile and saturating low 90s on room air with normal heart rate and systolic blood pressure in the 90s and greater.  EKG demonstrates sinus rhythm.  Plain radiographs of the chest and pelvis are negative.  No acute findings noted on CT head or CT cervical spine.  Chest CT is concerning for acute displaced fractures of multiple ribs on the right.  Labs are most notable for creatinine 1.55, WBC 16,100, lactic acid 2.6, normal troponin x 2, and normal BNP.  Blood cultures were collected in the ED and the patient was treated with 2 L of IV fluids, multiple doses of fentanyl, Rocephin, and azithromycin.  Trauma surgery was consulted by the ED physician.  Review of Systems:  All other systems reviewed and apart from HPI, are negative.  Past Medical History:  Diagnosis Date   ABDOMINAL PAIN-EPIGASTRIC    COAGULOPATHY, COUMADIN-INDUCED     DYSPHAGIA OROPHARYNGEAL PHASE    Heartburn    HYPERLIPIDEMIA    HYPERTENSION    HYPERTENSION NEC    HYPOTHYROIDISM    PE    Primary hypercoagulable state (HCC)    Thoracic aneurysm without mention of rupture    Thoracic aortic aneurysm (HCC)    VASCULITIS     Past Surgical History:  Procedure Laterality Date   CARDIAC CATHETERIZATION N/A 12/08/2016   Procedure: Left Heart Cath and Coronary Angiography;  Surgeon: Lennette Bihari, MD;  Location: [facility];  Service: Cardiovascular;  Laterality: N/A;   ELBOW SURGERY Left 2010    Social History:   reports that he has never smoked. He has never used smokeless tobacco. He reports that he does not drink alcohol and does not use drugs.  Allergies  Allergen Reactions   Latex Rash    Family History  Problem Relation Age of Onset   Heart disease Mother    Hypertension Father      Prior to Admission medications   Medication Sig Start Date End Date Taking? Authorizing Provider  amLODipine (NORVASC) 10 MG tablet TAKE 1 TABLET BY MOUTH EVERY DAY 05/11/23   Corky Crafts, MD  apixaban (ELIQUIS) 5 MG TABS tablet Take 1 tablet (5 mg total) by mouth 2 (two) times daily. 02/22/23   Corky Crafts, MD  clindamycin (CLEOCIN-T) 1 % lotion Apply topically daily. Apply topically daily to the affected areas 03/25/23 03/24/24  Onalee Hua,  Gregery Na, DO  clobetasol cream (TEMOVATE) 0.05 % Apply 1 Application topically 2 (two) times daily. Apply 2 x daily for 2 weeks then stop. Restart 2 weeks 05/06/23   Terri Piedra, DO  diazepam (VALIUM) 5 MG tablet Take one tablet by mouth one hour prior to CT Scan 03/23/22   Corky Crafts, MD  levothyroxine (SYNTHROID) 112 MCG tablet Take 224 mcg by mouth every morning. 02/08/23   [provider]  methylPREDNISolone (MEDROL DOSEPAK) 4 MG TBPK tablet Take as directed (6 tablets day 1, 5 tablets day 2, 4 tablets day 3, 3 tablets day 4, 2 tablets day 5, 1 tablet on day 6) 03/25/23   Terri Piedra, DO  metoprolol succinate (TOPROL-XL) 50 MG 24 hr tablet TAKE 1 TABLET BY MOUTH EVERY DAY 03/22/23   Corky Crafts, MD  nitroGLYCERIN (NITROSTAT) 0.4 MG SL tablet Place 1 tablet under the tongue as needed for chest pain. 05/13/21   [provider]  potassium chloride SA (KLOR-CON) 20 MEQ tablet Take 20 mEq by mouth 3 (three) times daily.    [provider]  predniSONE (DELTASONE) 10 MG tablet Take 4 tablets (40 mg total) by mouth daily with breakfast for 6 days, THEN 3 tablets (30 mg total) daily with breakfast for 6 days, THEN 2 tablets (20 mg total) daily with breakfast for 6 days, THEN 1 tablet (10 mg total) daily with breakfast for 6 days. 05/06/23 05/30/23  Terri Piedra, DO  sertraline (ZOLOFT) 100 MG tablet Take 200 mg by mouth daily.  10/30/19   [provider]  silver sulfADIAZINE (SILVADENE) 1 % cream Apply 1 Application topically 2 (two) times daily. Apply 2 times daily to affected areas when not using Clobetasol cream 05/06/23   Terri Piedra, DO  spironolactone (ALDACTONE) 25 MG tablet TAKE 1 TABLET (25 MG TOTAL) BY MOUTH DAILY. 02/03/23   Corky Crafts, MD  tacrolimus (PROTOPIC) 0.1 % ointment Apply topically 2 (two) times daily. Apply topically to the affected areas for up to two weeks alternating with Triamcinolone 03/25/23   Terri Piedra, DO  triamcinolone 0.1%-silver sulfadiazine 1:1 cream mixture Apply topically 2 (two) times daily. Apply to the affected areas twice daily for up to two weeks alternating with Tacrolimus 03/25/23   Terri Piedra, DO  valsartan (DIOVAN) 320 MG tablet Take 1 tablet (320 mg total) by mouth daily. 03/01/23   Corky Crafts, MD    Physical Exam: Vitals:   05/26/23 1655 05/26/23 1800 05/26/23 1815  BP: 94/62 111/64 (!) 109/59  Pulse: 83 63 65  Resp: 20 14 15   Temp: 98.5 F (36.9 C)    TempSrc: Oral    SpO2: 99% 93% 95%     Constitutional: NAD, no pallor or cyanosis   Eyes: PERTLA, lids  and conjunctivae normal ENMT: Mucous membranes are moist. Posterior pharynx clear of any exudate or lesions.   Neck: supple, no masses  Respiratory: no wheezing, no crackles. No accessory muscle use.  Cardiovascular: S1 & S2 heard, regular rate and rhythm. No extremity edema.  Abdomen: No distension, no tenderness, soft. Bowel sounds active.  Musculoskeletal: no clubbing / cyanosis. No joint deformity upper and lower extremities.   Skin: Small abrasion to right forehead. Warm, dry, well-perfused. Neurologic: CN 2-12 grossly intact. Moving all extremities. Alert and oriented.  Psychiatric: Pleasant. Cooperative.    Labs and Imaging on Admission: I have personally reviewed following labs and imaging studies  CBC: Recent Labs  Lab 05/26/23 1703 05/26/23 1724  WBC 16.1*  --   HGB 16.5 16.7  HCT 50.0 49.0  MCV 97.7  --   PLT 214  --    Basic Metabolic Panel: Recent Labs  Lab 05/26/23 1703 05/26/23 1724  NA 139 141  K 3.9 3.7  CL 106 105  CO2 21*  --   GLUCOSE 100* 102*  BUN 19 19  CREATININE 1.55* 1.50*  CALCIUM 9.1  --    GFR: CrCl cannot be calculated (Unknown ideal weight.). Liver Function Tests: Recent Labs  Lab 05/26/23 1703  AST 28  ALT 21  ALKPHOS 54  BILITOT 1.0  PROT 6.5  ALBUMIN 3.4*   No results for input(s): "LIPASE", "AMYLASE" in the last 168 hours. No results for input(s): "AMMONIA" in the last 168 hours. Coagulation Profile: Recent Labs  Lab 05/26/23 1703  INR 1.3*   Cardiac Enzymes: Recent Labs  Lab 05/26/23 1703  CKTOTAL 80   BNP (last 3 results) No results for input(s): "PROBNP" in the last 8760 hours. HbA1C: No results for input(s): "HGBA1C" in the last 72 hours. CBG: No results for input(s): "GLUCAP" in the last 168 hours. Lipid Profile: No results for input(s): "CHOL", "HDL", "LDLCALC", "TRIG", "CHOLHDL", "LDLDIRECT" in the last 72 hours. Thyroid Function Tests: No results for input(s): "TSH", "T4TOTAL", "FREET4", "T3FREE",  "THYROIDAB" in the last 72 hours. Anemia Panel: No results for input(s): "VITAMINB12", "FOLATE", "FERRITIN", "TIBC", "IRON", "RETICCTPCT" in the last 72 hours. Urine analysis:    Component Value Date/Time   COLORURINE YELLOW 11/05/2020 1942   APPEARANCEUR CLEAR 11/05/2020 1942   LABSPEC 1.021 11/05/2020 1942   PHURINE 5.0 11/05/2020 1942   GLUCOSEU NEGATIVE 11/05/2020 1942   HGBUR MODERATE (A) 11/05/2020 1942   BILIRUBINUR NEGATIVE 11/05/2020 1942   KETONESUR NEGATIVE 11/05/2020 1942   PROTEINUR NEGATIVE 11/05/2020 1942   UROBILINOGEN 0.2 04/10/2010 1712   NITRITE NEGATIVE 11/05/2020 1942   LEUKOCYTESUR NEGATIVE 11/05/2020 1942   Sepsis Labs: @LABRCNTIP (procalcitonin:4,lacticidven:4) )No results found for this or any previous visit (from the past 240 hour(s)).   Radiological Exams on Admission: CT CHEST ABDOMEN PELVIS W CONTRAST  Result Date: 05/26/2023 CLINICAL DATA:  Poly trauma, blunt EXAM: CT CHEST, ABDOMEN, AND PELVIS WITH CONTRAST TECHNIQUE: Multidetector CT imaging of the chest, abdomen and pelvis was performed following the standard protocol during bolus administration of intravenous contrast. RADIATION DOSE REDUCTION: This exam was performed according to the departmental dose-optimization program which includes automated exposure control, adjustment of the mA and/or kV according to patient size and/or use of iterative reconstruction technique. CONTRAST:  75mL OMNIPAQUE IOHEXOL 350 MG/ML SOLN COMPARISON:  None 922 FINDINGS: CT CHEST FINDINGS Cardiovascular: Normal heart size. No pericardial effusions. Ascending thoracic aortic aneurysm measuring 4.6 cm. This is mildly enlarged from prior study measurement of 4.3 cm by my measurement. Minimal aortic calcification. No dissection. Mediastinum/Nodes: No enlarged mediastinal, hilar, or axillary lymph nodes. Thyroid gland, trachea, and esophagus demonstrate no significant findings. Lungs/Pleura: Dependent atelectasis in the lung bases.  Focal patchy infiltrates in the anterior right upper lung could represent early pneumonia or possibly lung contusion. No pleural effusions. No pneumothorax. Musculoskeletal: Acute displaced fractures of the right posterior third, fourth, ninth, and tenth ribs as well as the lateral right third, fourth, and fifth ribs, and the anterior right sixth rib. Sternum, spine, and visualized shoulders appear intact. CT ABDOMEN PELVIS FINDINGS Hepatobiliary: Multiple circumscribed low-attenuation lesions demonstrated throughout the liver, largest measuring 1.5 cm diameter. No change since prior study. Lesions are  likely cysts. No imaging follow-up is indicated. Gallbladder and bile ducts are unremarkable. Pancreas: Unremarkable. No pancreatic ductal dilatation or surrounding inflammatory changes. Spleen: Normal in size without focal abnormality. Adrenals/Urinary Tract: No adrenal gland nodules. Renal nephrograms are symmetrical. No hydronephrosis or hydroureter. Left midpole renal cyst measuring 3.7 cm diameter. No change since prior study. No imaging follow-up is indicated. Bladder is normal. Stomach/Bowel: Stomach, small bowel, and colon are not abnormally distended. No wall thickening or inflammatory changes. No mesenteric mass or collection. Appendix is normal. Vascular/Lymphatic: No significant vascular findings are present. No enlarged abdominal or pelvic lymph nodes. Reproductive: Prostate is unremarkable. Other: No free fluid or free air in the abdomen. Abdominal wall musculature appears intact. Musculoskeletal: Mild degenerative changes in the spine. No acute bony abnormalities. IMPRESSION: 1. Numerous right rib fractures as described above, some with displacement. 2. Small focal area of patchy infiltration in the right anterior mid lung, possibly pneumonia or contusion. 3. No evidence of vascular or mediastinal injury. 4. No evidence of solid organ injury or bowel perforation. 5. 4.6 cm diameter ascending thoracic  aortic aneurysm, mildly increasing from 4.3 cm diameter previously. No dissection. Ascending thoracic aortic aneurysm. Recommend semi-annual imaging followup by CTA or MRA and referral to cardiothoracic surgery if not already obtained. This recommendation follows 2010 ACCF/AHA/AATS/ACR/ASA/SCA/SCAI/SIR/STS/SVM Guidelines for the Diagnosis and Management of Patients With Thoracic Aortic Disease. Circulation. 2010; 121: Z610-R604. Aortic aneurysm NOS (ICD10-I71.9) Electronically Signed   By: Burman Nieves M.D.   On: 05/26/2023 18:47   CT T-SPINE NO CHARGE  Result Date: 05/26/2023 CLINICAL DATA:  Poly trauma. EXAM: CT THORACIC SPINE WITHOUT CONTRAST TECHNIQUE: Multidetector CT images of the thoracic were obtained using the standard protocol without intravenous contrast. RADIATION DOSE REDUCTION: This exam was performed according to the departmental dose-optimization program which includes automated exposure control, adjustment of the mA and/or kV according to patient size and/or use of iterative reconstruction technique. COMPARISON:  None Available. FINDINGS: Alignment: No malalignment. Vertebrae: No thoracic region fracture. L1 is included and appears normal. Paraspinal and other soft tissues: No traumatic finding. See results of chest CT regarding rib injuries. Disc levels: No significant disc level finding. No stenosis of the canal or foramina. Small osteophytes at T7-8. IMPRESSION: No acute or traumatic finding in the thoracic region. See results of chest CT regarding rib injuries. Electronically Signed   By: Paulina Fusi M.D.   On: 05/26/2023 18:41   CT L-SPINE NO CHARGE  Result Date: 05/26/2023 CLINICAL DATA:  Poly trauma. EXAM: CT LUMBAR SPINE WITHOUT CONTRAST TECHNIQUE: Multidetector CT imaging of the lumbar spine was performed without intravenous contrast administration. Multiplanar CT image reconstructions were also generated. RADIATION DOSE REDUCTION: This exam was performed according to the  departmental dose-optimization program which includes automated exposure control, adjustment of the mA and/or kV according to patient size and/or use of iterative reconstruction technique. COMPARISON:  None Available. FINDINGS: Segmentation: The examination begins at the inferior aspect of L1. The entire L1 vertebral body is not included. Alignment: Normal Vertebrae: No lumbar region fracture. Paraspinal and other soft tissues: See results of abdominal CT. Disc levels: No significant lumbar degenerative changes. No apparent stenosis of the canal or foramina. IMPRESSION: The examination begins at the inferior aspect of L1. The entire L1 vertebral body is not included. No lumbar region fracture or traumatic malalignment. Electronically Signed   By: Paulina Fusi M.D.   On: 05/26/2023 18:39   CT CERVICAL SPINE WO CONTRAST  Result Date: 05/26/2023 CLINICAL DATA:  Poly  trauma. EXAM: CT CERVICAL SPINE WITHOUT CONTRAST TECHNIQUE: Multidetector CT imaging of the cervical spine was performed without intravenous contrast. Multiplanar CT image reconstructions were also generated. RADIATION DOSE REDUCTION: This exam was performed according to the departmental dose-optimization program which includes automated exposure control, adjustment of the mA and/or kV according to patient size and/or use of iterative reconstruction technique. COMPARISON:  10/07/2010 FINDINGS: Alignment: No malalignment. Skull base and vertebrae: No regional fracture or focal lesion. Soft tissues and spinal canal: No traumatic soft tissue finding. Disc levels: Mild cervical spondylosis. No more than mild osteophytic encroachment upon the foramina on the right. On the left, there is moderate bony foraminal narrowing at C6-7. Upper chest: See results of chest CT. Other: None IMPRESSION: No acute or traumatic finding. Mild cervical spondylosis. Electronically Signed   By: Paulina Fusi M.D.   On: 05/26/2023 18:38   CT HEAD WO CONTRAST  Result Date:  05/26/2023 CLINICAL DATA:  Poly trauma. EXAM: CT HEAD WITHOUT CONTRAST TECHNIQUE: Contiguous axial images were obtained from the base of the skull through the vertex without intravenous contrast. RADIATION DOSE REDUCTION: This exam was performed according to the departmental dose-optimization program which includes automated exposure control, adjustment of the mA and/or kV according to patient size and/or use of iterative reconstruction technique. COMPARISON:  04/25/2019 FINDINGS: Brain: There chronic small-vessel ischemic changes of the cerebral hemispheric white matter. No sign of acute infarction, mass lesion, hemorrhage, hydrocephalus or extra-axial collection. Vascular: No abnormal vascular finding. Skull: Normal Sinuses/Orbits: Clear/normal Other: None IMPRESSION: No acute or traumatic finding. Chronic small-vessel ischemic changes of the cerebral hemispheric white matter. Electronically Signed   By: Paulina Fusi M.D.   On: 05/26/2023 18:36   DG Pelvis Portable  Result Date: 05/26/2023 CLINICAL DATA:  Pain after trauma EXAM: PORTABLE PELVIS 1 VIEWS COMPARISON:  None Available. FINDINGS: No fracture or dislocation. Preserved joint spaces and bone mineralization. IMPRESSION: No acute osseous abnormality Electronically Signed   By: Karen Kays M.D.   On: 05/26/2023 17:35   DG Chest Port 1 View  Result Date: 05/26/2023 CLINICAL DATA:  Pain after trauma EXAM: PORTABLE CHEST 1 VIEW COMPARISON:  08/29/2021 x-ray and CT angiogram FINDINGS: Normal cardiopericardial silhouette. No consolidation, pneumothorax or effusion. No edema. Calcified aorta. Underinflation. IMPRESSION: No acute cardiopulmonary disease. Electronically Signed   By: Karen Kays M.D.   On: 05/26/2023 17:34    EKG: Independently reviewed. Sinus rhythm.   Assessment/Plan   1. Multiple rib fractures  - Appreciate trauma surgery assessment and recommendations  - Continue multimodal pain-control, aggressive pulmonary toilet    2. Syncope   - No prodrome, EKG essentially normal  - Check orthostatic vitals, continue cardiac monitoring, check echocardiogram, use fall precautions    3. AKI  - SCr is 1.55 on admission, up from baseline closer to 1  - Hold ARB and aldactone, continue IVF hydration, repeat chem panel in am   4. CAD - No anginal complaints  - Continue metoprolol    5. Hx of PE  - Continue Eliquis    6. Hypertension  - BP low-normal in ED and he has AKI   - Hold Norvasc, valsartan, and Aldactone for now, continue metoprolol with holding parameters    7. Hypothyroidism  - Continue Synthroid    8. Ascending thoracic aortic aneurysm  - Noted on CT in ED  - Outpatient follow-up for semiannual imaging recommended    DVT prophylaxis: Eliquis  Code Status: Full  Level of Care: Level of care: Progressive  Family Communication: Mother at bedside  Disposition Plan:  Patient is from: home  Anticipated d/c is to: TBD Anticipated d/c date is: 05/28/23  Patient currently: Pending syncope workup, improved oxygenation  Consults called: Trauma   Admission status: Inpatient     Briscoe Deutscher, MD [facility]  05/26/2023, 8:23 PM

## 2023-05-26 NOTE — Consult Note (Signed)
[name] June 19, 1966  [id number].    Requesting MD: Dr. Gloris Manchester Chief Complaint/Reason for Consult: rib fractures  HPI:  [name] is a 57 yo male who presented to the ED after a syncopal event. He does not remember anything immediately before the incident. He was found on the ground and it is unknown if he hit anything when he fell. Imaging workup showed multiple right rib fractures. No intracranial bleeding, pneumothorax, or intraabdominal injuries were noted on CT scans. Labs are notable for a mild AKI. His only pain is in the back with movement. He is on Eliquis for a history of VTE.   ROS: Review of Systems  Constitutional:  Negative for chills and fever.  Eyes:  Negative for double vision and redness.  Respiratory:  Negative for shortness of breath.   Cardiovascular:  Negative for chest pain.  Gastrointestinal:  Negative for abdominal pain.  Musculoskeletal:  Positive for back pain. Negative for neck pain.  Neurological:  Positive for loss of consciousness.    Family History  Problem Relation Age of Onset   Heart disease Mother    Hypertension Father     Past Medical History:  Diagnosis Date   ABDOMINAL PAIN-EPIGASTRIC    COAGULOPATHY, COUMADIN-INDUCED    DYSPHAGIA OROPHARYNGEAL PHASE    Heartburn    HYPERLIPIDEMIA    HYPERTENSION    HYPERTENSION NEC    HYPOTHYROIDISM    PE    Primary hypercoagulable state (HCC)    Thoracic aneurysm without mention of rupture    Thoracic aortic aneurysm (HCC)    VASCULITIS     Past Surgical History:  Procedure Laterality Date   CARDIAC CATHETERIZATION N/A 12/08/2016   Procedure: Left Heart Cath and Coronary Angiography;  Surgeon: Lennette Bihari, MD;  Location: [facility];  Service: Cardiovascular;  Laterality: N/A;   ELBOW SURGERY Left 2010    Social History:  reports that he has never smoked. He has never used smokeless tobacco. He reports that he does not drink alcohol and does not use  drugs.  Allergies:  Allergies  Allergen Reactions   Latex Rash    Medications Prior to Admission  Medication Sig Dispense Refill   amLODipine (NORVASC) 10 MG tablet TAKE 1 TABLET BY MOUTH EVERY DAY 90 tablet 3   apixaban (ELIQUIS) 5 MG TABS tablet Take 1 tablet (5 mg total) by mouth 2 (two) times daily. 60 tablet 5   clindamycin (CLEOCIN-T) 1 % lotion Apply topically daily. Apply topically daily to the affected areas 60 mL 2   clobetasol cream (TEMOVATE) 0.05 % Apply 1 Application topically 2 (two) times daily. Apply 2 x daily for 2 weeks then stop. Restart 2 weeks 60 g 2   diazepam (VALIUM) 5 MG tablet Take one tablet by mouth one hour prior to CT Scan 1 tablet 0   levothyroxine (SYNTHROID) 112 MCG tablet Take 224 mcg by mouth every morning.     methylPREDNISolone (MEDROL DOSEPAK) 4 MG TBPK tablet Take as directed (6 tablets day 1, 5 tablets day 2, 4 tablets day 3, 3 tablets day 4, 2 tablets day 5, 1 tablet on day 6) 1 each 0   metoprolol succinate (TOPROL-XL) 50 MG 24 hr tablet TAKE 1 TABLET BY MOUTH EVERY DAY 90 tablet 3   nitroGLYCERIN (NITROSTAT) 0.4 MG SL tablet Place 1 tablet under the tongue as needed for chest pain.     potassium chloride SA (KLOR-CON) 20 MEQ tablet Take 20 mEq by mouth  3 (three) times daily.     predniSONE (DELTASONE) 10 MG tablet Take 4 tablets (40 mg total) by mouth daily with breakfast for 6 days, THEN 3 tablets (30 mg total) daily with breakfast for 6 days, THEN 2 tablets (20 mg total) daily with breakfast for 6 days, THEN 1 tablet (10 mg total) daily with breakfast for 6 days. 60 tablet 0   sertraline (ZOLOFT) 100 MG tablet Take 200 mg by mouth daily.      silver sulfADIAZINE (SILVADENE) 1 % cream Apply 1 Application topically 2 (two) times daily. Apply 2 times daily to affected areas when not using Clobetasol cream 400 g 1   spironolactone (ALDACTONE) 25 MG tablet TAKE 1 TABLET (25 MG TOTAL) BY MOUTH DAILY. 90 tablet 1   tacrolimus (PROTOPIC) 0.1 % ointment  Apply topically 2 (two) times daily. Apply topically to the affected areas for up to two weeks alternating with Triamcinolone 100 g 0   triamcinolone 0.1%-silver sulfadiazine 1:1 cream mixture Apply topically 2 (two) times daily. Apply to the affected areas twice daily for up to two weeks alternating with Tacrolimus 220 g 2   valsartan (DIOVAN) 320 MG tablet Take 1 tablet (320 mg total) by mouth daily. 90 tablet 3     Physical Exam: Blood pressure 132/69, pulse 62, temperature 98.2 F (36.8 C), temperature source Oral, resp. rate 14, SpO2 96 %. General: resting comfortably, appears stated age, no apparent distress Neurological: alert and oriented, no focal deficits, cranial nerves grossly in tact HEENT: normocephalic, small superficial abrasion on right forehead CV: regular rate and rhythm, extremities warm and well-perfused Respiratory: normal work of breathing on 2L nasal cannula, symmetric chest wall expansion Abdomen: soft, nondistended, nontender to deep palpation. No masses or organomegaly. Extremities: warm and well-perfused, no deformities, moving all extremities spontaneously Psychiatric: normal mood and affect Skin: warm and dry, no jaundice, no rashes or lesions    Assessment/Plan 57 yo male s/p ground-level fall with syncope. He has fractures of R ribs 3, 4, 5, 6, 9, and 10, with likely mild pulmonary contusions. No other acute injuries noted on workup. - Repeat CXR in am - Multimodal pain control. Scheduled tylenol and robaxin added. - Aggressive pulmonary toilet. IS provided at bedside, current effort is excellent (pulling >2000). - Ok for a regular diet from a trauma standpoint - Patient is admitted to hospitalist for a syncopal workup. Trauma will follow.   Sophronia Simas, MD [facility] Surgery General, Hepatobiliary and Pancreatic Surgery 05/26/23 10:18 PM

## 2023-05-26 NOTE — ED Triage Notes (Signed)
Pt BIB [facility] as a Fall on thinners. Was helping his mother with a walker but had a syncopal event & hit his head, does take Eliquis. A/Ox4, 93 cbg, 93% on RA, 92/60. Originally was GCS 14 on scene. Does have HA, Hx of blood clot. Level 2 activated ion Triage.

## 2023-05-26 NOTE — TOC CAGE-AID Note (Signed)
Transition of Care [facility]) - CAGE-AID Screening   Patient Details  Name: [name] MRN: [id number] Date of Birth: [birth year]  [facility]) CM/SW Contact:    Leota Sauers, RN Phone Number: 05/26/2023, 9:56 PM   Clinical Narrative:  Patient denies use of alcohol and illicit drugs. Education not offered at this time.  CAGE-AID Screening:    Have You Ever Felt You Ought to Cut Down on Your Drinking or Drug Use?: No Have People Annoyed You By Critizing Your Drinking Or Drug Use?: No Have You Felt Bad Or Guilty About Your Drinking Or Drug Use?: No Have You Ever Had a Drink or Used Drugs First Thing In The Morning to Steady Your Nerves or to Get Rid of a Hangover?: No CAGE-AID Score: 0  Substance Abuse Education Offered: No

## 2023-05-26 NOTE — ED Notes (Signed)
ED TO INPATIENT HANDOFF REPORT  ED Nurse Name and Phone #: [phone]  S Name/Age/Gender [name] 57 y.o. male Room/Bed: 012C/012C  Code Status   Code Status: Full Code  Home/SNF/Other Home Patient oriented to: self, place, time, and situation Is this baseline? Yes   Triage Complete: Triage complete  Chief Complaint Closed traumatic displaced fracture of multiple ribs [S22.49XA]  Triage Note Pt BIB [facility] as a Fall on thinners. Was helping his mother with a walker but had a syncopal event & hit his head, does take Eliquis. A/Ox4, 93 cbg, 93% on RA, 92/60. Originally was [facility]CS 14 on scene. Does have HA, Hx of blood clot. Level 2 activated ion Triage.    Allergies Allergies  Allergen Reactions   Latex Rash    Level of Care/Admitting Diagnosis ED Disposition     ED Disposition  Admit   Condition  --   Comment  Hospital Area: [facility] [[id number]]  Level of Care: Progressive [102]  Admit to Progressive based on following criteria: Other see comments  Comments: multiple displaced rib fractures  May admit patient to [facility] or [facility] if equivalent level of care is available:: Yes  Covid Evaluation: Asymptomatic - no recent exposure (last 10 days) testing not required  Diagnosis: Closed traumatic displaced fracture of multiple ribs [[id number]]  Admitting Physician: Briscoe Deutscher [[id number]]  Attending Physician: Briscoe Deutscher [[id number]]  Certification:: I certify this patient will need inpatient services for at least 2 midnights  Estimated Length of Stay: 3          B Medical/Surgery History Past Medical History:  Diagnosis Date   ABDOMINAL PAIN-EPIGASTRIC    COAGULOPATHY, COUMADIN-INDUCED    DYSPHAGIA OROPHARYNGEAL PHASE    Heartburn    HYPERLIPIDEMIA    HYPERTENSION    HYPERTENSION NEC    HYPOTHYROIDISM    PE    Primary hypercoagulable state (HCC)    Thoracic aneurysm without mention of rupture    Thoracic aortic  aneurysm (HCC)    VASCULITIS    Past Surgical History:  Procedure Laterality Date   CARDIAC CATHETERIZATION N/A 12/08/2016   Procedure: Left Heart Cath and Coronary Angiography;  Surgeon: Lennette Bihari, MD;  Location: [facility] INVASIVE CV LAB;  Service: Cardiovascular;  Laterality: N/A;   ELBOW SURGERY Left 2010     A IV Location/Drains/Wounds Patient Lines/Drains/Airways Status     Active Line/Drains/Airways     Name Placement date Placement time Site Days   Peripheral IV 05/26/23 20 G Right Antecubital 05/26/23  1700  Antecubital  less than 1            Intake/Output Last 24 hours No intake or output data in the 24 hours ending 05/26/23 2043  Labs/Imaging Results for orders placed or performed during the hospital encounter of 05/26/23 (from the past 48 hour(s))  Comprehensive metabolic panel     Status: Abnormal   Collection Time: 05/26/23  5:03 PM  Result Value Ref Range   Sodium 139 135 - 145 mmol/L   Potassium 3.9 3.5 - 5.1 mmol/L   Chloride 106 98 - 111 mmol/L   CO2 21 (L) 22 - 32 mmol/L   Glucose, Bld 100 (H) 70 - 99 mg/dL    Comment: Glucose reference range applies only to samples taken after fasting for at least 8 hours.   BUN 19 6 - 20 mg/dL   Creatinine, Ser 9.62 (H) 0.61 - 1.24 mg/dL   Calcium 9.1 8.9 -  10.3 mg/dL   Total Protein 6.5 6.5 - 8.1 g/dL   Albumin 3.4 (L) 3.5 - 5.0 g/dL   AST 28 15 - 41 U/L   ALT 21 0 - 44 U/L   Alkaline Phosphatase 54 38 - 126 U/L   Total Bilirubin 1.0 0.3 - 1.2 mg/dL   GFR, Estimated 53 (L) >60 mL/min    Comment: (NOTE) Calculated using the CKD-EPI Creatinine Equation (2021)    Anion gap 12 5 - 15    Comment: Performed at [facility], [address] [place], [place] [address]  CBC     Status: Abnormal   Collection Time: 05/26/23  5:03 PM  Result Value Ref Range   WBC 16.1 (H) 4.0 - 10.5 K/uL   RBC 5.12 4.22 - 5.81 MIL/uL   Hemoglobin 16.5 13.0 - 17.0 g/dL   HCT 19.1 47.8 - 29.5 %   MCV 97.7 80.0 - 100.0 fL    MCH 32.2 26.0 - 34.0 pg   MCHC 33.0 30.0 - 36.0 g/dL   RDW 62.1 30.8 - 65.7 %   Platelets 214 150 - 400 K/uL   nRBC 0.0 0.0 - 0.2 %    Comment: Performed at [facility], [address] [place], [place] [address]  Ethanol     Status: None   Collection Time: 05/26/23  5:03 PM  Result Value Ref Range   Alcohol, Ethyl (B) <10 <10 mg/dL    Comment: (NOTE) Lowest detectable limit for serum alcohol is 10 mg/dL.  For medical purposes only. Performed at [facility], [address] [place], [place] [address]   Lactic acid, plasma     Status: Abnormal   Collection Time: 05/26/23  5:03 PM  Result Value Ref Range   Lactic Acid, Venous 2.6 (HH) 0.5 - 1.9 mmol/L    Comment: CRITICAL RESULT CALLED TO, READ BACK BY AND VERIFIED WITH Erma Pinto. RN @ 1755 05/26/23 JBUTLER Performed at [facility], [address], [place], [place] [address]   Protime-INR     Status: Abnormal   Collection Time: 05/26/23  5:03 PM  Result Value Ref Range   Prothrombin Time 16.0 (H) 11.4 - 15.2 seconds   INR 1.3 (H) 0.8 - 1.2    Comment: (NOTE) INR goal varies based on device and disease states. Performed at [facility], [address] [place], [place] [address]   CK     Status: None   Collection Time: 05/26/23  5:03 PM  Result Value Ref Range   Total CK 80 49 - 397 U/L    Comment: Performed at [facility], [address] [place], [place] [address]  Troponin I (High Sensitivity)     Status: None   Collection Time: 05/26/23  5:03 PM  Result Value Ref Range   Troponin I (High Sensitivity) 8 <18 ng/L    Comment: (NOTE) Elevated high sensitivity troponin I (hsTnI) values and significant  changes across serial measurements may suggest ACS but many other  chronic and acute conditions are known to elevate hsTnI results.  Refer to the "Links" section for chest pain algorithms and additional  guidance. Performed at [facility], [address] [place], [place] [address]    I-Stat Chem 8, ED     Status: Abnormal   Collection Time: 05/26/23  5:24 PM  Result Value Ref Range   Sodium 141 135 - 145 mmol/L   Potassium 3.7 3.5 -  5.1 mmol/L   Chloride 105 98 - 111 mmol/L   BUN 19 6 - 20 mg/dL   Creatinine, Ser 5.28 (H) 0.61 - 1.24 mg/dL   Glucose, Bld 413 (H) 70 - 99 mg/dL    Comment: Glucose reference range applies only to samples taken after fasting for at least 8 hours.   Calcium, Ion 1.12 (L) 1.15 - 1.40 mmol/L   TCO2 25 22 - 32 mmol/L   Hemoglobin 16.7 13.0 - 17.0 g/dL   HCT 24.4 01.0 - 27.2 %  Troponin I (High Sensitivity)     Status: None   Collection Time: 05/26/23  6:42 PM  Result Value Ref Range   Troponin I (High Sensitivity) 5 <18 ng/L    Comment: (NOTE) Elevated high sensitivity troponin I (hsTnI) values and significant  changes across serial measurements may suggest ACS but many other  chronic and acute conditions are known to elevate hsTnI results.  Refer to the "Links" section for chest pain algorithms and additional  guidance. Performed at [facility], [address], [place], [place] [address]    CT CHEST ABDOMEN PELVIS W CONTRAST  Result Date: 05/26/2023 CLINICAL DATA:  Poly trauma, blunt EXAM: CT CHEST, ABDOMEN, AND PELVIS WITH CONTRAST TECHNIQUE: Multidetector CT imaging of the chest, abdomen and pelvis was performed following the standard protocol during bolus administration of intravenous contrast. RADIATION DOSE REDUCTION: This exam was performed according to the departmental dose-optimization program which includes automated exposure control, adjustment of the mA and/or kV according to patient size and/or use of iterative reconstruction technique. CONTRAST:  75mL OMNIPAQUE IOHEXOL 350 MG/ML SOLN COMPARISON:  None 922 FINDINGS: CT CHEST FINDINGS Cardiovascular: Normal heart size. No pericardial effusions. Ascending thoracic aortic aneurysm measuring 4.6 cm. This is mildly enlarged from prior study measurement of 4.3 cm by my  measurement. Minimal aortic calcification. No dissection. Mediastinum/Nodes: No enlarged mediastinal, hilar, or axillary lymph nodes. Thyroid gland, trachea, and esophagus demonstrate no significant findings. Lungs/Pleura: Dependent atelectasis in the lung bases. Focal patchy infiltrates in the anterior right upper lung could represent early pneumonia or possibly lung contusion. No pleural effusions. No pneumothorax. Musculoskeletal: Acute displaced fractures of the right posterior third, fourth, ninth, and tenth ribs as well as the lateral right third, fourth, and fifth ribs, and the anterior right sixth rib. Sternum, spine, and visualized shoulders appear intact. CT ABDOMEN PELVIS FINDINGS Hepatobiliary: Multiple circumscribed low-attenuation lesions demonstrated throughout the liver, largest measuring 1.5 cm diameter. No change since prior study. Lesions are likely cysts. No imaging follow-up is indicated. Gallbladder and bile ducts are unremarkable. Pancreas: Unremarkable. No pancreatic ductal dilatation or surrounding inflammatory changes. Spleen: Normal in size without focal abnormality. Adrenals/Urinary Tract: No adrenal gland nodules. Renal nephrograms are symmetrical. No hydronephrosis or hydroureter. Left midpole renal cyst measuring 3.7 cm diameter. No change since prior study. No imaging follow-up is indicated. Bladder is normal. Stomach/Bowel: Stomach, small bowel, and colon are not abnormally distended. No wall thickening or inflammatory changes. No mesenteric mass or collection. Appendix is normal. Vascular/Lymphatic: No significant vascular findings are present. No enlarged abdominal or pelvic lymph nodes. Reproductive: Prostate is unremarkable. Other: No free fluid or free air in the abdomen. Abdominal wall musculature appears intact. Musculoskeletal: Mild degenerative changes in the spine. No acute bony abnormalities. IMPRESSION: 1. Numerous right rib fractures as described above, some with  displacement. 2. Small focal area of patchy infiltration in the right anterior mid lung, possibly pneumonia or contusion. 3. No evidence of vascular or mediastinal  injury. 4. No evidence of solid organ injury or bowel perforation. 5. 4.6 cm diameter ascending thoracic aortic aneurysm, mildly increasing from 4.3 cm diameter previously. No dissection. Ascending thoracic aortic aneurysm. Recommend semi-annual imaging followup by CTA or MRA and referral to cardiothoracic surgery if not already obtained. This recommendation follows 2010 ACCF/AHA/AATS/ACR/ASA/SCA/SCAI/SIR/STS/SVM Guidelines for the Diagnosis and Management of Patients With Thoracic Aortic Disease. Circulation. 2010; 121: U981-X914. Aortic aneurysm NOS (ICD10-I71.9) Electronically Signed   By: Burman Nieves M.D.   On: 05/26/2023 18:47   CT T-SPINE NO CHARGE  Result Date: 05/26/2023 CLINICAL DATA:  Poly trauma. EXAM: CT THORACIC SPINE WITHOUT CONTRAST TECHNIQUE: Multidetector CT images of the thoracic were obtained using the standard protocol without intravenous contrast. RADIATION DOSE REDUCTION: This exam was performed according to the departmental dose-optimization program which includes automated exposure control, adjustment of the mA and/or kV according to patient size and/or use of iterative reconstruction technique. COMPARISON:  None Available. FINDINGS: Alignment: No malalignment. Vertebrae: No thoracic region fracture. L1 is included and appears normal. Paraspinal and other soft tissues: No traumatic finding. See results of chest CT regarding rib injuries. Disc levels: No significant disc level finding. No stenosis of the canal or foramina. Small osteophytes at T7-8. IMPRESSION: No acute or traumatic finding in the thoracic region. See results of chest CT regarding rib injuries. Electronically Signed   By: Paulina Fusi M.D.   On: 05/26/2023 18:41   CT L-SPINE NO CHARGE  Result Date: 05/26/2023 CLINICAL DATA:  Poly trauma. EXAM: CT LUMBAR  SPINE WITHOUT CONTRAST TECHNIQUE: Multidetector CT imaging of the lumbar spine was performed without intravenous contrast administration. Multiplanar CT image reconstructions were also generated. RADIATION DOSE REDUCTION: This exam was performed according to the departmental dose-optimization program which includes automated exposure control, adjustment of the mA and/or kV according to patient size and/or use of iterative reconstruction technique. COMPARISON:  None Available. FINDINGS: Segmentation: The examination begins at the inferior aspect of L1. The entire L1 vertebral body is not included. Alignment: Normal Vertebrae: No lumbar region fracture. Paraspinal and other soft tissues: See results of abdominal CT. Disc levels: No significant lumbar degenerative changes. No apparent stenosis of the canal or foramina. IMPRESSION: The examination begins at the inferior aspect of L1. The entire L1 vertebral body is not included. No lumbar region fracture or traumatic malalignment. Electronically Signed   By: Paulina Fusi M.D.   On: 05/26/2023 18:39   CT CERVICAL SPINE WO CONTRAST  Result Date: 05/26/2023 CLINICAL DATA:  Poly trauma. EXAM: CT CERVICAL SPINE WITHOUT CONTRAST TECHNIQUE: Multidetector CT imaging of the cervical spine was performed without intravenous contrast. Multiplanar CT image reconstructions were also generated. RADIATION DOSE REDUCTION: This exam was performed according to the departmental dose-optimization program which includes automated exposure control, adjustment of the mA and/or kV according to patient size and/or use of iterative reconstruction technique. COMPARISON:  10/07/2010 FINDINGS: Alignment: No malalignment. Skull base and vertebrae: No regional fracture or focal lesion. Soft tissues and spinal canal: No traumatic soft tissue finding. Disc levels: Mild cervical spondylosis. No more than mild osteophytic encroachment upon the foramina on the right. On the left, there is moderate bony  foraminal narrowing at C6-7. Upper chest: See results of chest CT. Other: None IMPRESSION: No acute or traumatic finding. Mild cervical spondylosis. Electronically Signed   By: Paulina Fusi M.D.   On: 05/26/2023 18:38   CT HEAD WO CONTRAST  Result Date: 05/26/2023 CLINICAL DATA:  Poly trauma. EXAM: CT HEAD WITHOUT CONTRAST TECHNIQUE:  Contiguous axial images were obtained from the base of the skull through the vertex without intravenous contrast. RADIATION DOSE REDUCTION: This exam was performed according to the departmental dose-optimization program which includes automated exposure control, adjustment of the mA and/or kV according to patient size and/or use of iterative reconstruction technique. COMPARISON:  04/25/2019 FINDINGS: Brain: There chronic small-vessel ischemic changes of the cerebral hemispheric white matter. No sign of acute infarction, mass lesion, hemorrhage, hydrocephalus or extra-axial collection. Vascular: No abnormal vascular finding. Skull: Normal Sinuses/Orbits: Clear/normal Other: None IMPRESSION: No acute or traumatic finding. Chronic small-vessel ischemic changes of the cerebral hemispheric white matter. Electronically Signed   By: Paulina Fusi M.D.   On: 05/26/2023 18:36   DG Pelvis Portable  Result Date: 05/26/2023 CLINICAL DATA:  Pain after trauma EXAM: PORTABLE PELVIS 1 VIEWS COMPARISON:  None Available. FINDINGS: No fracture or dislocation. Preserved joint spaces and bone mineralization. IMPRESSION: No acute osseous abnormality Electronically Signed   By: Karen Kays M.D.   On: 05/26/2023 17:35   DG Chest Port 1 View  Result Date: 05/26/2023 CLINICAL DATA:  Pain after trauma EXAM: PORTABLE CHEST 1 VIEW COMPARISON:  08/29/2021 x-ray and CT angiogram FINDINGS: Normal cardiopericardial silhouette. No consolidation, pneumothorax or effusion. No edema. Calcified aorta. Underinflation. IMPRESSION: No acute cardiopulmonary disease. Electronically Signed   By: Karen Kays M.D.   On:  05/26/2023 17:34    Pending Labs Unresulted Labs (From admission, onward)     Start     Ordered   05/27/23 0500  HIV Antibody (routine testing w rflx)  (HIV Antibody (Routine testing w reflex) panel)  Tomorrow morning,   R        05/26/23 2023   05/27/23 0500  Basic metabolic panel  Daily,   R      05/26/23 2023   05/27/23 0500  CBC  Daily,   R      05/26/23 2023   05/26/23 1731  Blood culture (routine x 2)  BLOOD CULTURE X 2,   R      05/26/23 1730   05/26/23 1659  Urinalysis, Routine w reflex microscopic -Urine, Clean Catch  (Trauma Panel)  Once,   URGENT       Question:  Specimen Source  Answer:  Urine, Clean Catch   05/26/23 1659   05/26/23 1659  Urine rapid drug screen (hosp performed)  ONCE - STAT,   STAT        05/26/23 1659            Vitals/Pain Today's Vitals   05/26/23 1655 05/26/23 1800 05/26/23 1815 05/26/23 1940  BP: 94/62 111/64 (!) 109/59   Pulse: 83 63 65   Resp: 20 14 15    Temp: 98.5 F (36.9 C)     TempSrc: Oral     SpO2: 99% 93% 95%   PainSc: 8    8     Isolation Precautions No active isolations  Medications Medications  lactated ringers bolus 500 mL (has no administration in time range)  sodium chloride 0.9 % bolus 1,500 mL (1,500 mLs Intravenous New Bag/Given 05/26/23 1949)  fentaNYL (SUBLIMAZE) injection 50 mcg (has no administration in time range)  metoprolol succinate (TOPROL-XL) 24 hr tablet 50 mg (has no administration in time range)  sertraline (ZOLOFT) tablet 200 mg (has no administration in time range)  levothyroxine (SYNTHROID) tablet 224 mcg (has no administration in time range)  apixaban (ELIQUIS) tablet 5 mg (has no administration in time range)  sodium chloride flush (NS)  0.9 % injection 3 mL (has no administration in time range)  acetaminophen (TYLENOL) tablet 650 mg (has no administration in time range)    Or  acetaminophen (TYLENOL) suppository 650 mg (has no administration in time range)  oxyCODONE (Oxy IR/ROXICODONE)  immediate release tablet 5 mg (has no administration in time range)  HYDROmorphone (DILAUDID) injection 0.5 mg (has no administration in time range)  senna (SENOKOT) tablet 8.6 mg (has no administration in time range)  polyethylene glycol (MIRALAX / GLYCOLAX) packet 17 g (has no administration in time range)  ondansetron (ZOFRAN) tablet 4 mg (has no administration in time range)    Or  ondansetron (ZOFRAN) injection 4 mg (has no administration in time range)  0.9 %  sodium chloride infusion (has no administration in time range)  fentaNYL (SUBLIMAZE) injection 50 mcg (50 mcg Intravenous Given 05/26/23 1824)  iohexol (OMNIPAQUE) 350 MG/ML injection 75 mL (75 mLs Intravenous Contrast Given 05/26/23 1820)  ketorolac (TORADOL) 15 MG/ML injection 15 mg (15 mg Intravenous Given 05/26/23 1941)  fentaNYL (SUBLIMAZE) injection 100 mcg (100 mcg Intravenous Given 05/26/23 1941)    Mobility walks     Focused Assessments Neuro Assessment Handoff:  Swallow screen pass? Yes  Cardiac Rhythm: Normal sinus rhythm       Neuro Assessment:   Neuro Checks:      Has TPA been given? No If patient is a Neuro Trauma and patient is going to OR before floor call report to 4N Charge nurse: [phone] or [phone]   R Recommendations: See Admitting Provider Note  Report given to:   Additional Notes:

## 2023-05-26 NOTE — ED Notes (Addendum)
CBG 98  

## 2023-05-26 NOTE — Progress Notes (Signed)
Orthopedic Tech Progress Note Patient Details:  [name] 03-14-1966 [id number]  Level 2 trauma   Patient ID: [name], male   DOB: February 22, 1966, 57 y.o.   MRN: [id number]  [name] 05/26/2023, 5:11 PM

## 2023-05-26 NOTE — ED Provider Notes (Signed)
[facility] Provider Note   CSN: [id number] Arrival date & time: 05/26/23  1651     History  Chief Complaint  Patient presents with   [name] is a 57 y.o. male.  HPI Patient presents for loss of consciousness.  Medical history includes HLD, HTN, thoracic aortic aneurysm, PE.  Patient was found on the floor by his mother at approximately 3 PM.  EMS was called to the scene.  He was initially GCS 14.  Mentation has improved during transit.  He does not recall the events prior to EMS being called.  He does not remember falling.  Currently, he endorses pain in right thoracic area as well as a mild headache.  He is on Eliquis for history of VTE.  He did take his dose this morning.  Patient was ambulatory with EMS.  Vital signs were notable for soft blood pressures in the range of 90s over 60s.  History per mother: Patient has seemed to be in his normal state of health over the last several days, including earlier today.  Patient and mother went to [employer] to do some shopping.  When they returned home, he walked out on the driveway to obtain groceries and walker from the trunk.  He had a loss of consciousness and a fall to the ground.  She is not aware of any prior episodes of syncope.    Home Medications Prior to Admission medications   Medication Sig Start Date End Date Taking? Authorizing Provider  amLODipine (NORVASC) 10 MG tablet TAKE 1 TABLET BY MOUTH EVERY DAY 05/11/23   Corky Crafts, MD  apixaban (ELIQUIS) 5 MG TABS tablet Take 1 tablet (5 mg total) by mouth 2 (two) times daily. 02/22/23   Corky Crafts, MD  clindamycin (CLEOCIN-T) 1 % lotion Apply topically daily. Apply topically daily to the affected areas 03/25/23 03/24/24  Terri Piedra, DO  clobetasol cream (TEMOVATE) 0.05 % Apply 1 Application topically 2 (two) times daily. Apply 2 x daily for 2 weeks then stop. Restart 2 weeks 05/06/23   Terri Piedra,  DO  diazepam (VALIUM) 5 MG tablet Take one tablet by mouth one hour prior to CT Scan 03/23/22   Corky Crafts, MD  levothyroxine (SYNTHROID) 112 MCG tablet Take 224 mcg by mouth every morning. 02/08/23   [provider]  methylPREDNISolone (MEDROL DOSEPAK) 4 MG TBPK tablet Take as directed (6 tablets day 1, 5 tablets day 2, 4 tablets day 3, 3 tablets day 4, 2 tablets day 5, 1 tablet on day 6) 03/25/23   Terri Piedra, DO  metoprolol succinate (TOPROL-XL) 50 MG 24 hr tablet TAKE 1 TABLET BY MOUTH EVERY DAY 03/22/23   Corky Crafts, MD  nitroGLYCERIN (NITROSTAT) 0.4 MG SL tablet Place 1 tablet under the tongue as needed for chest pain. 05/13/21   [provider]  potassium chloride SA (KLOR-CON) 20 MEQ tablet Take 20 mEq by mouth 3 (three) times daily.    [provider]  predniSONE (DELTASONE) 10 MG tablet Take 4 tablets (40 mg total) by mouth daily with breakfast for 6 days, THEN 3 tablets (30 mg total) daily with breakfast for 6 days, THEN 2 tablets (20 mg total) daily with breakfast for 6 days, THEN 1 tablet (10 mg total) daily with breakfast for 6 days. 05/06/23 05/30/23  Terri Piedra, DO  sertraline (ZOLOFT) 100 MG tablet Take 200 mg by mouth  daily.  10/30/19   [provider]  silver sulfADIAZINE (SILVADENE) 1 % cream Apply 1 Application topically 2 (two) times daily. Apply 2 times daily to affected areas when not using Clobetasol cream 05/06/23   Terri Piedra, DO  spironolactone (ALDACTONE) 25 MG tablet TAKE 1 TABLET (25 MG TOTAL) BY MOUTH DAILY. 02/03/23   Corky Crafts, MD  tacrolimus (PROTOPIC) 0.1 % ointment Apply topically 2 (two) times daily. Apply topically to the affected areas for up to two weeks alternating with Triamcinolone 03/25/23   Terri Piedra, DO  triamcinolone 0.1%-silver sulfadiazine 1:1 cream mixture Apply topically 2 (two) times daily. Apply to the affected areas twice daily for up to two weeks alternating with  Tacrolimus 03/25/23   Terri Piedra, DO  valsartan (DIOVAN) 320 MG tablet Take 1 tablet (320 mg total) by mouth daily. 03/01/23   Corky Crafts, MD      Allergies    Latex    Review of Systems   Review of Systems  Musculoskeletal:  Positive for back pain.  Neurological:  Positive for syncope and headaches.  Psychiatric/Behavioral:  Positive for confusion.   All other systems reviewed and are negative.   Physical Exam Updated Vital Signs BP (!) 109/59   Pulse 65   Temp 98.5 F (36.9 C) (Oral)   Resp 15   SpO2 95%  Physical Exam Vitals and nursing note reviewed.  Constitutional:      General: He is not in acute distress.    Appearance: Normal appearance. He is well-developed. He is not ill-appearing, toxic-appearing or diaphoretic.  HENT:     Head: Normocephalic.     Comments: Mild abrasion to right forehead    Right Ear: External ear normal.     Left Ear: External ear normal.     Nose: Nose normal.     Mouth/Throat:     Mouth: Mucous membranes are moist.  Eyes:     Extraocular Movements: Extraocular movements intact.     Conjunctiva/sclera: Conjunctivae normal.  Cardiovascular:     Rate and Rhythm: Normal rate and regular rhythm.     Heart sounds: No murmur heard. Pulmonary:     Effort: Pulmonary effort is normal. No respiratory distress.     Breath sounds: Normal breath sounds. No wheezing or rales.  Chest:     Chest wall: No tenderness.  Abdominal:     General: There is no distension.     Palpations: Abdomen is soft.     Tenderness: There is no abdominal tenderness.  Musculoskeletal:        General: Tenderness (Right thoracic area) present. No swelling.     Cervical back: Normal range of motion and neck supple.  Skin:    General: Skin is warm and dry.     Capillary Refill: Capillary refill takes less than 2 seconds.     Coloration: Skin is not jaundiced or pale.  Neurological:     General: No focal deficit present.     Mental Status: He is alert  and oriented to person, place, and time.     Cranial Nerves: No cranial nerve deficit.     Sensory: No sensory deficit.     Motor: No weakness.     Coordination: Coordination normal.  Psychiatric:        Mood and Affect: Mood normal.        Behavior: Behavior normal.        Thought Content: Thought content normal.  Judgment: Judgment normal.     ED Results / Procedures / Treatments   Labs (all labs ordered are listed, but only abnormal results are displayed) Labs Reviewed  COMPREHENSIVE METABOLIC PANEL - Abnormal; Notable for the following components:      Result Value   CO2 21 (*)    Glucose, Bld 100 (*)    Creatinine, Ser 1.55 (*)    Albumin 3.4 (*)    GFR, Estimated 53 (*)    All other components within normal limits  CBC - Abnormal; Notable for the following components:   WBC 16.1 (*)    All other components within normal limits  LACTIC ACID, PLASMA - Abnormal; Notable for the following components:   Lactic Acid, Venous 2.6 (*)    All other components within normal limits  PROTIME-INR - Abnormal; Notable for the following components:   Prothrombin Time 16.0 (*)    INR 1.3 (*)    All other components within normal limits  I-STAT CHEM 8, ED - Abnormal; Notable for the following components:   Creatinine, Ser 1.50 (*)    Glucose, Bld 102 (*)    Calcium, Ion 1.12 (*)    All other components within normal limits  CULTURE, BLOOD (ROUTINE X 2)  CULTURE, BLOOD (ROUTINE X 2)  ETHANOL  CK  URINALYSIS, ROUTINE W REFLEX MICROSCOPIC  RAPID URINE DRUG SCREEN, HOSP PERFORMED  HIV ANTIBODY (ROUTINE TESTING W REFLEX)  BASIC METABOLIC PANEL  CBC  TROPONIN I (HIGH SENSITIVITY)  TROPONIN I (HIGH SENSITIVITY)    EKG EKG Interpretation  Date/Time:  Wednesday May 26 2023 17:10:26 EDT Ventricular Rate:  69 PR Interval:  159 QRS Duration: 101 QT Interval:  368 QTC Calculation: 395 R Axis:   -11 Text Interpretation: Sinus rhythm Low voltage, precordial leads Confirmed  by Gloris Manchester ([id number]) on 05/26/2023 6:01:16 PM  Radiology CT CHEST ABDOMEN PELVIS W CONTRAST  Result Date: 05/26/2023 CLINICAL DATA:  Poly trauma, blunt EXAM: CT CHEST, ABDOMEN, AND PELVIS WITH CONTRAST TECHNIQUE: Multidetector CT imaging of the chest, abdomen and pelvis was performed following the standard protocol during bolus administration of intravenous contrast. RADIATION DOSE REDUCTION: This exam was performed according to the departmental dose-optimization program which includes automated exposure control, adjustment of the mA and/or kV according to patient size and/or use of iterative reconstruction technique. CONTRAST:  75mL OMNIPAQUE IOHEXOL 350 MG/ML SOLN COMPARISON:  None 922 FINDINGS: CT CHEST FINDINGS Cardiovascular: Normal heart size. No pericardial effusions. Ascending thoracic aortic aneurysm measuring 4.6 cm. This is mildly enlarged from prior study measurement of 4.3 cm by my measurement. Minimal aortic calcification. No dissection. Mediastinum/Nodes: No enlarged mediastinal, hilar, or axillary lymph nodes. Thyroid gland, trachea, and esophagus demonstrate no significant findings. Lungs/Pleura: Dependent atelectasis in the lung bases. Focal patchy infiltrates in the anterior right upper lung could represent early pneumonia or possibly lung contusion. No pleural effusions. No pneumothorax. Musculoskeletal: Acute displaced fractures of the right posterior third, fourth, ninth, and tenth ribs as well as the lateral right third, fourth, and fifth ribs, and the anterior right sixth rib. Sternum, spine, and visualized shoulders appear intact. CT ABDOMEN PELVIS FINDINGS Hepatobiliary: Multiple circumscribed low-attenuation lesions demonstrated throughout the liver, largest measuring 1.5 cm diameter. No change since prior study. Lesions are likely cysts. No imaging follow-up is indicated. Gallbladder and bile ducts are unremarkable. Pancreas: Unremarkable. No pancreatic ductal dilatation or surrounding  inflammatory changes. Spleen: Normal in size without focal abnormality. Adrenals/Urinary Tract: No adrenal gland nodules. Renal nephrograms are symmetrical. No  hydronephrosis or hydroureter. Left midpole renal cyst measuring 3.7 cm diameter. No change since prior study. No imaging follow-up is indicated. Bladder is normal. Stomach/Bowel: Stomach, small bowel, and colon are not abnormally distended. No wall thickening or inflammatory changes. No mesenteric mass or collection. Appendix is normal. Vascular/Lymphatic: No significant vascular findings are present. No enlarged abdominal or pelvic lymph nodes. Reproductive: Prostate is unremarkable. Other: No free fluid or free air in the abdomen. Abdominal wall musculature appears intact. Musculoskeletal: Mild degenerative changes in the spine. No acute bony abnormalities. IMPRESSION: 1. Numerous right rib fractures as described above, some with displacement. 2. Small focal area of patchy infiltration in the right anterior mid lung, possibly pneumonia or contusion. 3. No evidence of vascular or mediastinal injury. 4. No evidence of solid organ injury or bowel perforation. 5. 4.6 cm diameter ascending thoracic aortic aneurysm, mildly increasing from 4.3 cm diameter previously. No dissection. Ascending thoracic aortic aneurysm. Recommend semi-annual imaging followup by CTA or MRA and referral to cardiothoracic surgery if not already obtained. This recommendation follows 2010 ACCF/AHA/AATS/ACR/ASA/SCA/SCAI/SIR/STS/SVM Guidelines for the Diagnosis and Management of Patients With Thoracic Aortic Disease. Circulation. 2010; 121: W098-J191. Aortic aneurysm NOS (ICD10-I71.9) Electronically Signed   By: Burman Nieves M.D.   On: 05/26/2023 18:47   CT T-SPINE NO CHARGE  Result Date: 05/26/2023 CLINICAL DATA:  Poly trauma. EXAM: CT THORACIC SPINE WITHOUT CONTRAST TECHNIQUE: Multidetector CT images of the thoracic were obtained using the standard protocol without intravenous  contrast. RADIATION DOSE REDUCTION: This exam was performed according to the departmental dose-optimization program which includes automated exposure control, adjustment of the mA and/or kV according to patient size and/or use of iterative reconstruction technique. COMPARISON:  None Available. FINDINGS: Alignment: No malalignment. Vertebrae: No thoracic region fracture. L1 is included and appears normal. Paraspinal and other soft tissues: No traumatic finding. See results of chest CT regarding rib injuries. Disc levels: No significant disc level finding. No stenosis of the canal or foramina. Small osteophytes at T7-8. IMPRESSION: No acute or traumatic finding in the thoracic region. See results of chest CT regarding rib injuries. Electronically Signed   By: Paulina Fusi M.D.   On: 05/26/2023 18:41   CT L-SPINE NO CHARGE  Result Date: 05/26/2023 CLINICAL DATA:  Poly trauma. EXAM: CT LUMBAR SPINE WITHOUT CONTRAST TECHNIQUE: Multidetector CT imaging of the lumbar spine was performed without intravenous contrast administration. Multiplanar CT image reconstructions were also generated. RADIATION DOSE REDUCTION: This exam was performed according to the departmental dose-optimization program which includes automated exposure control, adjustment of the mA and/or kV according to patient size and/or use of iterative reconstruction technique. COMPARISON:  None Available. FINDINGS: Segmentation: The examination begins at the inferior aspect of L1. The entire L1 vertebral body is not included. Alignment: Normal Vertebrae: No lumbar region fracture. Paraspinal and other soft tissues: See results of abdominal CT. Disc levels: No significant lumbar degenerative changes. No apparent stenosis of the canal or foramina. IMPRESSION: The examination begins at the inferior aspect of L1. The entire L1 vertebral body is not included. No lumbar region fracture or traumatic malalignment. Electronically Signed   By: Paulina Fusi M.D.   On:  05/26/2023 18:39   CT CERVICAL SPINE WO CONTRAST  Result Date: 05/26/2023 CLINICAL DATA:  Poly trauma. EXAM: CT CERVICAL SPINE WITHOUT CONTRAST TECHNIQUE: Multidetector CT imaging of the cervical spine was performed without intravenous contrast. Multiplanar CT image reconstructions were also generated. RADIATION DOSE REDUCTION: This exam was performed according to the departmental dose-optimization program which  includes automated exposure control, adjustment of the mA and/or kV according to patient size and/or use of iterative reconstruction technique. COMPARISON:  10/07/2010 FINDINGS: Alignment: No malalignment. Skull base and vertebrae: No regional fracture or focal lesion. Soft tissues and spinal canal: No traumatic soft tissue finding. Disc levels: Mild cervical spondylosis. No more than mild osteophytic encroachment upon the foramina on the right. On the left, there is moderate bony foraminal narrowing at C6-7. Upper chest: See results of chest CT. Other: None IMPRESSION: No acute or traumatic finding. Mild cervical spondylosis. Electronically Signed   By: Paulina Fusi M.D.   On: 05/26/2023 18:38   CT HEAD WO CONTRAST  Result Date: 05/26/2023 CLINICAL DATA:  Poly trauma. EXAM: CT HEAD WITHOUT CONTRAST TECHNIQUE: Contiguous axial images were obtained from the base of the skull through the vertex without intravenous contrast. RADIATION DOSE REDUCTION: This exam was performed according to the departmental dose-optimization program which includes automated exposure control, adjustment of the mA and/or kV according to patient size and/or use of iterative reconstruction technique. COMPARISON:  04/25/2019 FINDINGS: Brain: There chronic small-vessel ischemic changes of the cerebral hemispheric white matter. No sign of acute infarction, mass lesion, hemorrhage, hydrocephalus or extra-axial collection. Vascular: No abnormal vascular finding. Skull: Normal Sinuses/Orbits: Clear/normal Other: None IMPRESSION: No  acute or traumatic finding. Chronic small-vessel ischemic changes of the cerebral hemispheric white matter. Electronically Signed   By: Paulina Fusi M.D.   On: 05/26/2023 18:36   DG Pelvis Portable  Result Date: 05/26/2023 CLINICAL DATA:  Pain after trauma EXAM: PORTABLE PELVIS 1 VIEWS COMPARISON:  None Available. FINDINGS: No fracture or dislocation. Preserved joint spaces and bone mineralization. IMPRESSION: No acute osseous abnormality Electronically Signed   By: Karen Kays M.D.   On: 05/26/2023 17:35   DG Chest Port 1 View  Result Date: 05/26/2023 CLINICAL DATA:  Pain after trauma EXAM: PORTABLE CHEST 1 VIEW COMPARISON:  08/29/2021 x-ray and CT angiogram FINDINGS: Normal cardiopericardial silhouette. No consolidation, pneumothorax or effusion. No edema. Calcified aorta. Underinflation. IMPRESSION: No acute cardiopulmonary disease. Electronically Signed   By: Karen Kays M.D.   On: 05/26/2023 17:34    Procedures Procedures    Medications Ordered in ED Medications  lactated ringers bolus 500 mL (has no administration in time range)  sodium chloride 0.9 % bolus 1,500 mL (1,500 mLs Intravenous New Bag/Given 05/26/23 1949)  azithromycin (ZITHROMAX) 500 mg in sodium chloride 0.9 % 250 mL IVPB (has no administration in time range)  cefTRIAXone (ROCEPHIN) 2 g in sodium chloride 0.9 % 100 mL IVPB (has no administration in time range)  fentaNYL (SUBLIMAZE) injection 50 mcg (has no administration in time range)  metoprolol succinate (TOPROL-XL) 24 hr tablet 50 mg (has no administration in time range)  sertraline (ZOLOFT) tablet 200 mg (has no administration in time range)  levothyroxine (SYNTHROID) tablet 224 mcg (has no administration in time range)  apixaban (ELIQUIS) tablet 5 mg (has no administration in time range)  sodium chloride flush (NS) 0.9 % injection 3 mL (has no administration in time range)  acetaminophen (TYLENOL) tablet 650 mg (has no administration in time range)    Or   acetaminophen (TYLENOL) suppository 650 mg (has no administration in time range)  oxyCODONE (Oxy IR/ROXICODONE) immediate release tablet 5 mg (has no administration in time range)  HYDROmorphone (DILAUDID) injection 0.5 mg (has no administration in time range)  senna (SENOKOT) tablet 8.6 mg (has no administration in time range)  polyethylene glycol (MIRALAX / GLYCOLAX) packet 17 g (has  no administration in time range)  ondansetron (ZOFRAN) tablet 4 mg (has no administration in time range)    Or  ondansetron (ZOFRAN) injection 4 mg (has no administration in time range)  0.9 %  sodium chloride infusion (has no administration in time range)  fentaNYL (SUBLIMAZE) injection 50 mcg (50 mcg Intravenous Given 05/26/23 1824)  iohexol (OMNIPAQUE) 350 MG/ML injection 75 mL (75 mLs Intravenous Contrast Given 05/26/23 1820)  ketorolac (TORADOL) 15 MG/ML injection 15 mg (15 mg Intravenous Given 05/26/23 1941)  fentaNYL (SUBLIMAZE) injection 100 mcg (100 mcg Intravenous Given 05/26/23 1941)    ED Course/ Medical Decision Making/ A&P                             Medical Decision Making Amount and/or Complexity of Data Reviewed Labs: ordered. Radiology: ordered. ECG/medicine tests: ordered.  Risk Prescription drug management. Decision regarding hospitalization.   This patient presents to the ED for concern of syncope and fall, this involves an extensive number of treatment options, and is a complaint that carries with it a high risk of complications and morbidity.  The differential diagnosis includes arrhythmia, dehydration, polypharmacy, infection, metabolic derangements, vasovagal episode   Co morbidities that complicate the patient evaluation  HLD, HTN, thoracic aortic aneurysm, PE   Additional history obtained:  Additional history obtained from EMS, patient's mother External records from outside source obtained and reviewed including EMR   Lab Tests:  I Ordered, and personally interpreted  labs.  The pertinent results include: AKI is present.  Electrolytes are normal.  A leukocytosis and lactic acidosis is present.  Troponin is normal.  Hemoglobin is normal.   Imaging Studies ordered:  I ordered imaging studies including x-ray of chest and pelvis, CT scan of head, cervical spine, chest, abdomen, pelvis, T-spine, L-spine I independently visualized and interpreted imaging which showed multiple right-sided rib fractures I agree with the radiologist interpretation   Cardiac Monitoring: / EKG:  The patient was maintained on a cardiac monitor.  I personally viewed and interpreted the cardiac monitored which showed an underlying rhythm of: Sinus rhythm   Consultations Obtained:  I requested consultation with the trauma surgeon, Dr. Sophronia Simas,  and discussed lab and imaging findings as well as pertinent plan - they recommend: Trauma surgery will see in consult   Problem List / ED Course / Critical interventions / Medication management  Patient presents to the ED after being found unconscious on the floor by his mother.  He had initial confusion with EMS but mentation has improved.  He is alert and oriented on arrival.  He has some mild evidence of trauma with an abrasion to his right forehead.  He is on Eliquis.  He was made a level 2 trauma.  Workup was initiated.  Fentanyl was given for analgesia.  Lab work is notable for AKI.  IV fluids were ordered.  He also has lactic acidosis and leukocytosis which is concerning for possible infection, especially in the setting of his low blood pressures.  On CT imaging, patient has multiple right-sided acute displaced rib fractures.  There is an underlying area of lung contusion and/or pneumonia.  Patient was treated empirically for pneumonia with ceftriaxone and azithromycin.  CT imaging was otherwise negative for acute findings.  On reassessment, patient continues to endorse 8/10 severity pain.  Additional fentanyl and Toradol were ordered.   Patient states that he has been in his normal state of health recently.  This  makes pneumonia less likely.  I did talk to his mother over the telephone for corroborating information.  She does state that he seemed fine earlier today.  Patient is on multiple blood pressure medications.  He states that he takes these around noon.  This was shortly prior to his syncopal episode.  Given his continued low blood pressures on arrival in the ED, I suspect polypharmacy is the cause of his hypotension, which then led to his syncope.  Currently, SpO2 was in the low 90s on room air, while at rest.  Given his AKI and syncopal episode, patient to be admitted for further management. I ordered medication including IV fluids for hydration; fentanyl and Toradol for analgesia; ceftriaxone and azithromycin for empiric treatment of pneumonia Reevaluation of the patient after these medicines showed that the patient improved I have reviewed the patients home medicines and have made adjustments as needed   Social Determinants of Health:  Lives independently         Final Clinical Impression(s) / ED Diagnoses Final diagnoses:  Syncope and collapse  Closed fracture of multiple ribs of right side, initial encounter  AKI (acute kidney injury) [facility])    Rx / DC Orders ED Discharge Orders     None         Gloris Manchester, MD 05/26/23 2036

## 2023-05-27 ENCOUNTER — Inpatient Hospital Stay (HOSPITAL_COMMUNITY): Payer: Medicaid Other

## 2023-05-27 ENCOUNTER — Other Ambulatory Visit (HOSPITAL_COMMUNITY): Payer: Medicaid Other

## 2023-05-27 ENCOUNTER — Observation Stay (HOSPITAL_BASED_OUTPATIENT_CLINIC_OR_DEPARTMENT_OTHER): Payer: Medicaid Other

## 2023-05-27 DIAGNOSIS — S2249XA Multiple fractures of ribs, unspecified side, initial encounter for closed fracture: Secondary | ICD-10-CM | POA: Diagnosis not present

## 2023-05-27 DIAGNOSIS — R55 Syncope and collapse: Secondary | ICD-10-CM

## 2023-05-27 LAB — CBC
HCT: 44.8 % (ref 39.0–52.0)
Hemoglobin: 15 g/dL (ref 13.0–17.0)
MCH: 33.3 pg (ref 26.0–34.0)
MCHC: 33.5 g/dL (ref 30.0–36.0)
MCV: 99.3 fL (ref 80.0–100.0)
Platelets: 172 10*3/uL (ref 150–400)
RBC: 4.51 MIL/uL (ref 4.22–5.81)
RDW: 13.3 % (ref 11.5–15.5)
WBC: 7.9 10*3/uL (ref 4.0–10.5)
nRBC: 0 % (ref 0.0–0.2)

## 2023-05-27 LAB — BASIC METABOLIC PANEL
Anion gap: 9 (ref 5–15)
BUN: 21 mg/dL — ABNORMAL HIGH (ref 6–20)
CO2: 27 mmol/L (ref 22–32)
Calcium: 8.6 mg/dL — ABNORMAL LOW (ref 8.9–10.3)
Chloride: 104 mmol/L (ref 98–111)
Creatinine, Ser: 1.26 mg/dL — ABNORMAL HIGH (ref 0.61–1.24)
GFR, Estimated: >60 mL/min (ref >60)
Glucose, Bld: 90 mg/dL (ref 70–99)
Potassium: 4.7 mmol/L (ref 3.5–5.1)
Sodium: 140 mmol/L (ref 135–145)

## 2023-05-27 LAB — CULTURE, BLOOD (ROUTINE X 2)

## 2023-05-27 LAB — ECHOCARDIOGRAM COMPLETE
Area-P 1/2: 2.62 cm2
S' Lateral: 2.7 cm

## 2023-05-27 LAB — GLUCOSE, CAPILLARY: Glucose-Capillary: 98 mg/dL (ref 70–99)

## 2023-05-27 LAB — HIV ANTIBODY (ROUTINE TESTING W REFLEX): HIV Screen 4th Generation wRfx: NONREACTIVE

## 2023-05-27 MED ORDER — SODIUM CHLORIDE 0.9 % IV SOLN: INTRAVENOUS | Status: DC

## 2023-05-27 MED ORDER — PERFLUTREN LIPID MICROSPHERE
1.0000 mL | INTRAVENOUS | Status: AC | PRN
  Administered 2023-05-27: 2 mL via INTRAVENOUS

## 2023-05-27 MED ORDER — METOPROLOL SUCCINATE ER 25 MG PO TB24
25.0000 mg | ORAL_TABLET | Freq: Every day | ORAL | Status: DC
  Administered 2023-05-28: 25 mg via ORAL
  Filled 2023-05-27: qty 1

## 2023-05-27 NOTE — Progress Notes (Signed)
[name]  ZOX:[id number] DOB: 02-Jul-1966 DOA: 05/26/2023 PCP: Daisy Floro, MD    Brief Narrative:  57 year old with a history of HTN, HLD, PE and DVT in 2011 & 2021 on Eliquis, hypothyroidism, CAD, and ascending thoracic aortic aneurysm who reported to the ER after suffering a syncopal event.  He was simply getting out of his car after a short trip to a local retailer when he lost consciousness without warning and fell to the ground.  He immediately awoke with pain to his right flank with deep breath and movement.  In the ER CT head and cervical spine was unrevealing.  Though CXR was unrevealing CT chest suggested acute displaced fractures of multiple ribs of the right chest.  Consultants:  None  Goals of Care:  Code Status: Full Code   DVT prophylaxis: Eliquis  Interim Hx: Afebrile.  Sinus bradycardia noted with heart rates as low as 53 at times.  Blood pressure preserved.  Oxygen saturation 95% room air.  Reports that chest wall pain is reasonably controlled at this time.  Denies shortness of breath fevers chills nausea or vomiting.  Reports that he lives alone.  Assessment & Plan:  Multiple right-sided rib fracture status post fall No further evaluation or intervention needed per Trauma Surgery   Syncope Likely orthostatic event related to simple dehydration - no chest pain or worrisome preceding symptoms - follow-up TTE to rule out valvular issues -bradycardia could be contributing as noted below  Sinus bradycardia Beta-blocker dose reduced -could be contributing to above, especially in setting of dehydration -monitor on telemetry -discussed with patient  Acute kidney injury Baseline creatinine 1.0 with creatinine 1.55 on admission -likely simple prerenal azotemia complicated by use of ARB and diuretic -hold ARB and diuretic and gently hydrate -recheck renal function in a.m.  Recent Labs  Lab 05/26/23 1703 05/26/23 1724 05/27/23 0755  CREATININE 1.55* 1.50*  1.26*   Reported hx of CAD? - INCORRECT No symptoms to suggest acute issues - cardiac cath in 2017 actually noted NORMAL CORONARIES   History of PE Continue chronic Eliquis -had apparent spontaneous episodes in 2011 and 2021  HTN Blood pressure actually low normal at presentation with exam suggestive of dehydration -holding Norvasc, valsartan, and Aldactone with gentle volume resuscitation  Hypothyroidism Continue usual Synthroid dose  Known ascending thoracic aortic aneurysm - bicuspid aortic valve  Continue outpatient follow-up with semiannual imaging -known to and followed by Cardiology  Obesity - There is no height or weight on file to calculate BMI.   Family Communication: No family present at time of exam Disposition: Anticipate discharge home 6/17 if TTE is unremarkable, telemetry is stable, and renal function has improved   Objective: Blood pressure 124/75, pulse (!) 58, temperature 97.8 F (36.6 C), temperature source Oral, resp. rate 18, SpO2 96 %.  Intake/Output Summary (Last 24 hours) at 05/27/2023 0845 Last data filed at 05/27/2023 0359 Gross per 24 hour  Intake 750 ml  Output --  Net 750 ml   There were no vitals filed for this visit.  Examination: General: No acute respiratory distress Lungs: Clear to auscultation bilaterally without wheezes or crackles Cardiovascular: Regular rate and rhythm without murmur gallop or rub normal S1 and S2 Abdomen: Nontender, nondistended, soft, bowel sounds positive, no rebound, no ascites, no appreciable mass Extremities: No significant cyanosis, clubbing, or edema bilateral lower extremities  CBC: Recent Labs  Lab 05/26/23 1703 05/26/23 1724 05/27/23 0755  WBC 16.1*  --  7.9  HGB 16.5 16.7  15.0  HCT 50.0 49.0 44.8  MCV 97.7  --  99.3  PLT 214  --  172   Basic Metabolic Panel: Recent Labs  Lab 05/26/23 1703 05/26/23 1724 05/27/23 0755  NA 139 141 140  K 3.9 3.7 4.7  CL 106 105 104  CO2 21*  --  27  GLUCOSE  100* 102* 90  BUN 19 19 21*  CREATININE 1.55* 1.50* 1.26*  CALCIUM 9.1  --  8.6*   GFR: CrCl cannot be calculated (Unknown ideal weight.).   Scheduled Meds:  acetaminophen  650 mg Oral QID   apixaban  5 mg Oral BID   levothyroxine  224 mcg Oral q morning   methocarbamol  500 mg Oral TID   [START ON 05/28/2023] metoprolol succinate  25 mg Oral Daily   senna  1 tablet Oral BID   sertraline  200 mg Oral Daily   sodium chloride flush  3 mL Intravenous Q12H   Continuous Infusions:  sodium chloride 125 mL/hr at 05/27/23 0509   lactated ringers       LOS: 1 day   Lonia Blood, MD [facility] Office  [phone] Pager - Text Page per Loretha Stapler  If 7PM-7AM, please contact night-coverage per Amion 05/27/2023, 8:45 AM

## 2023-05-27 NOTE — Progress Notes (Addendum)
Subjective: CC: Patient awake and alert. Orientated to self, time, place. He is aware that he had a syncopal episode after returning home from a trip to the store with his mother. Denies prodrome of symptoms. No hx of syncope.   Head pain over area of abrasion on R forehead and R rib pain. No HA, neck pain, other chest pain, sob, abdominal pain, n/v, UE or LE pain. On 2L o2 > weaned to RA while in the room. About to eat breakfast. Voiding without issues.   Afebrile. No tachycardia or hypotension. WBC wnl. Hgb 15. Cr 1.55 > 1.26. Has not required prn pain meds since admission.   No tobacco use. No drug or alcohol use.   Reports he is on Eliquis for PE that was dx 2 years ago.   Objective: Vital signs in last 24 hours: Temp:  [97.7 F (36.5 C)-98.5 F (36.9 C)] 97.8 F (36.6 C) (06/06 0303) Pulse Rate:  [58-83] 58 (06/06 0303) Resp:  [14-20] 18 (06/06 0303) BP: (94-132)/(54-75) 124/75 (06/06 0808) SpO2:  [93 %-99 %] 96 % (06/06 0303)    Intake/Output from previous day: 06/05 0701 - 06/06 0700 In: 750 [I.V.:750] Out: -  Intake/Output this shift: No intake/output data recorded.  PE: Gen:  Alert, NAD, pleasant HEENT: Abrasion to R forehead. EOM's intact, pupils equal and round Neck: No C-spine ttp or step offs Card:  RRR. Radial and DP pulse 2+ Pulm:  CTAB, no W/R/R, effort normal. On RA. Pulling 2500 on IS. R rib ttp.  Abd: Soft, ND, NT, +BS Psych: A&Ox3  Skin: Forehead abrasion as above. There are some scabs on the R arm without drainage or surrounding skin changes and appear old. Otherwise warm and dry Neuro: CN 3-12 grossly intact. Non-focal. MAE's Msk:  RUE: No gross deformities of joints or skin other than as mentioned above. Able active elbow, wrist and hand range of motion without pain. He has able to range the right shoulder and reports pain in his R ribs - no pain in the right shoulder with ROM.  No tenderness over clavicle, shoulder, upper arm, elbow,  forearm, wrists or hand. Radial 2+.  LUE: No gross deformities of joints or skin. Able active shoulder, elbow, wrist and hand range of motion without pain.  No tenderness over clavicle, shoulder, upper arm, elbow, forearm, wrists or hand. Radial 2+.  RLE: No sacral crepitus.  Negative logroll test. Able active range of motion of hip, knee, ankle without pain.  No tenderness over hip, upper legs, knee, lower leg, ankle or feet.  No lower extremity edema. WWP LLE: No sacral crepitus.  Negative logroll test. Able active range of motion of hip, knee, ankle without pain.  No tenderness over hip, upper legs, knee, lower leg, ankle or feet.  No lower extremity edema.   Lab Results:  Recent Labs    05/26/23 1703 05/26/23 1724 05/27/23 0755  WBC 16.1*  --  7.9  HGB 16.5 16.7 15.0  HCT 50.0 49.0 44.8  PLT 214  --  172   BMET Recent Labs    05/26/23 1703 05/26/23 1724  NA 139 141  K 3.9 3.7  CL 106 105  CO2 21*  --   GLUCOSE 100* 102*  BUN 19 19  CREATININE 1.55* 1.50*  CALCIUM 9.1  --    PT/INR Recent Labs    05/26/23 1703  LABPROT 16.0*  INR 1.3*   CMP     Component Value Date/Time  NA 141 05/26/2023 1724   NA 145 (H) 03/05/2022 1533   K 3.7 05/26/2023 1724   CL 105 05/26/2023 1724   CO2 21 (L) 05/26/2023 1703   GLUCOSE 102 (H) 05/26/2023 1724   BUN 19 05/26/2023 1724   BUN 14 03/05/2022 1533   CREATININE 1.50 (H) 05/26/2023 1724   CALCIUM 9.1 05/26/2023 1703   PROT 6.5 05/26/2023 1703   PROT 7.3 01/24/2020 1131   ALBUMIN 3.4 (L) 05/26/2023 1703   ALBUMIN 4.4 01/24/2020 1131   AST 28 05/26/2023 1703   ALT 21 05/26/2023 1703   ALKPHOS 54 05/26/2023 1703   BILITOT 1.0 05/26/2023 1703   BILITOT 0.5 01/24/2020 1131   GFRNONAA 53 (L) 05/26/2023 1703   GFRAA 103 01/24/2020 1131   Lipase     Component Value Date/Time   LIPASE 28 08/29/2021 2158    Studies/Results: CT CHEST ABDOMEN PELVIS W CONTRAST  Result Date: 05/26/2023 CLINICAL DATA:  Poly trauma, blunt  EXAM: CT CHEST, ABDOMEN, AND PELVIS WITH CONTRAST TECHNIQUE: Multidetector CT imaging of the chest, abdomen and pelvis was performed following the standard protocol during bolus administration of intravenous contrast. RADIATION DOSE REDUCTION: This exam was performed according to the departmental dose-optimization program which includes automated exposure control, adjustment of the mA and/or kV according to patient size and/or use of iterative reconstruction technique. CONTRAST:  75mL OMNIPAQUE IOHEXOL 350 MG/ML SOLN COMPARISON:  None 922 FINDINGS: CT CHEST FINDINGS Cardiovascular: Normal heart size. No pericardial effusions. Ascending thoracic aortic aneurysm measuring 4.6 cm. This is mildly enlarged from prior study measurement of 4.3 cm by my measurement. Minimal aortic calcification. No dissection. Mediastinum/Nodes: No enlarged mediastinal, hilar, or axillary lymph nodes. Thyroid gland, trachea, and esophagus demonstrate no significant findings. Lungs/Pleura: Dependent atelectasis in the lung bases. Focal patchy infiltrates in the anterior right upper lung could represent early pneumonia or possibly lung contusion. No pleural effusions. No pneumothorax. Musculoskeletal: Acute displaced fractures of the right posterior third, fourth, ninth, and tenth ribs as well as the lateral right third, fourth, and fifth ribs, and the anterior right sixth rib. Sternum, spine, and visualized shoulders appear intact. CT ABDOMEN PELVIS FINDINGS Hepatobiliary: Multiple circumscribed low-attenuation lesions demonstrated throughout the liver, largest measuring 1.5 cm diameter. No change since prior study. Lesions are likely cysts. No imaging follow-up is indicated. Gallbladder and bile ducts are unremarkable. Pancreas: Unremarkable. No pancreatic ductal dilatation or surrounding inflammatory changes. Spleen: Normal in size without focal abnormality. Adrenals/Urinary Tract: No adrenal gland nodules. Renal nephrograms are  symmetrical. No hydronephrosis or hydroureter. Left midpole renal cyst measuring 3.7 cm diameter. No change since prior study. No imaging follow-up is indicated. Bladder is normal. Stomach/Bowel: Stomach, small bowel, and colon are not abnormally distended. No wall thickening or inflammatory changes. No mesenteric mass or collection. Appendix is normal. Vascular/Lymphatic: No significant vascular findings are present. No enlarged abdominal or pelvic lymph nodes. Reproductive: Prostate is unremarkable. Other: No free fluid or free air in the abdomen. Abdominal wall musculature appears intact. Musculoskeletal: Mild degenerative changes in the spine. No acute bony abnormalities. IMPRESSION: 1. Numerous right rib fractures as described above, some with displacement. 2. Small focal area of patchy infiltration in the right anterior mid lung, possibly pneumonia or contusion. 3. No evidence of vascular or mediastinal injury. 4. No evidence of solid organ injury or bowel perforation. 5. 4.6 cm diameter ascending thoracic aortic aneurysm, mildly increasing from 4.3 cm diameter previously. No dissection. Ascending thoracic aortic aneurysm. Recommend semi-annual imaging followup by CTA or MRA  and referral to cardiothoracic surgery if not already obtained. This recommendation follows 2010 ACCF/AHA/AATS/ACR/ASA/SCA/SCAI/SIR/STS/SVM Guidelines for the Diagnosis and Management of Patients With Thoracic Aortic Disease. Circulation. 2010; 121: M578-I696. Aortic aneurysm NOS (ICD10-I71.9) Electronically Signed   By: Burman Nieves M.D.   On: 05/26/2023 18:47   CT T-SPINE NO CHARGE  Result Date: 05/26/2023 CLINICAL DATA:  Poly trauma. EXAM: CT THORACIC SPINE WITHOUT CONTRAST TECHNIQUE: Multidetector CT images of the thoracic were obtained using the standard protocol without intravenous contrast. RADIATION DOSE REDUCTION: This exam was performed according to the departmental dose-optimization program which includes automated  exposure control, adjustment of the mA and/or kV according to patient size and/or use of iterative reconstruction technique. COMPARISON:  None Available. FINDINGS: Alignment: No malalignment. Vertebrae: No thoracic region fracture. L1 is included and appears normal. Paraspinal and other soft tissues: No traumatic finding. See results of chest CT regarding rib injuries. Disc levels: No significant disc level finding. No stenosis of the canal or foramina. Small osteophytes at T7-8. IMPRESSION: No acute or traumatic finding in the thoracic region. See results of chest CT regarding rib injuries. Electronically Signed   By: Paulina Fusi M.D.   On: 05/26/2023 18:41   CT L-SPINE NO CHARGE  Result Date: 05/26/2023 CLINICAL DATA:  Poly trauma. EXAM: CT LUMBAR SPINE WITHOUT CONTRAST TECHNIQUE: Multidetector CT imaging of the lumbar spine was performed without intravenous contrast administration. Multiplanar CT image reconstructions were also generated. RADIATION DOSE REDUCTION: This exam was performed according to the departmental dose-optimization program which includes automated exposure control, adjustment of the mA and/or kV according to patient size and/or use of iterative reconstruction technique. COMPARISON:  None Available. FINDINGS: Segmentation: The examination begins at the inferior aspect of L1. The entire L1 vertebral body is not included. Alignment: Normal Vertebrae: No lumbar region fracture. Paraspinal and other soft tissues: See results of abdominal CT. Disc levels: No significant lumbar degenerative changes. No apparent stenosis of the canal or foramina. IMPRESSION: The examination begins at the inferior aspect of L1. The entire L1 vertebral body is not included. No lumbar region fracture or traumatic malalignment. Electronically Signed   By: Paulina Fusi M.D.   On: 05/26/2023 18:39   CT CERVICAL SPINE WO CONTRAST  Result Date: 05/26/2023 CLINICAL DATA:  Poly trauma. EXAM: CT CERVICAL SPINE WITHOUT  CONTRAST TECHNIQUE: Multidetector CT imaging of the cervical spine was performed without intravenous contrast. Multiplanar CT image reconstructions were also generated. RADIATION DOSE REDUCTION: This exam was performed according to the departmental dose-optimization program which includes automated exposure control, adjustment of the mA and/or kV according to patient size and/or use of iterative reconstruction technique. COMPARISON:  10/07/2010 FINDINGS: Alignment: No malalignment. Skull base and vertebrae: No regional fracture or focal lesion. Soft tissues and spinal canal: No traumatic soft tissue finding. Disc levels: Mild cervical spondylosis. No more than mild osteophytic encroachment upon the foramina on the right. On the left, there is moderate bony foraminal narrowing at C6-7. Upper chest: See results of chest CT. Other: None IMPRESSION: No acute or traumatic finding. Mild cervical spondylosis. Electronically Signed   By: Paulina Fusi M.D.   On: 05/26/2023 18:38   CT HEAD WO CONTRAST  Result Date: 05/26/2023 CLINICAL DATA:  Poly trauma. EXAM: CT HEAD WITHOUT CONTRAST TECHNIQUE: Contiguous axial images were obtained from the base of the skull through the vertex without intravenous contrast. RADIATION DOSE REDUCTION: This exam was performed according to the departmental dose-optimization program which includes automated exposure control, adjustment of the mA and/or  kV according to patient size and/or use of iterative reconstruction technique. COMPARISON:  04/25/2019 FINDINGS: Brain: There chronic small-vessel ischemic changes of the cerebral hemispheric white matter. No sign of acute infarction, mass lesion, hemorrhage, hydrocephalus or extra-axial collection. Vascular: No abnormal vascular finding. Skull: Normal Sinuses/Orbits: Clear/normal Other: None IMPRESSION: No acute or traumatic finding. Chronic small-vessel ischemic changes of the cerebral hemispheric white matter. Electronically Signed   By: Paulina Fusi M.D.   On: 05/26/2023 18:36   DG Pelvis Portable  Result Date: 05/26/2023 CLINICAL DATA:  Pain after trauma EXAM: PORTABLE PELVIS 1 VIEWS COMPARISON:  None Available. FINDINGS: No fracture or dislocation. Preserved joint spaces and bone mineralization. IMPRESSION: No acute osseous abnormality Electronically Signed   By: Karen Kays M.D.   On: 05/26/2023 17:35   DG Chest Port 1 View  Result Date: 05/26/2023 CLINICAL DATA:  Pain after trauma EXAM: PORTABLE CHEST 1 VIEW COMPARISON:  08/29/2021 x-ray and CT angiogram FINDINGS: Normal cardiopericardial silhouette. No consolidation, pneumothorax or effusion. No edema. Calcified aorta. Underinflation. IMPRESSION: No acute cardiopulmonary disease. Electronically Signed   By: Karen Kays M.D.   On: 05/26/2023 17:34    Anti-infectives: Anti-infectives (From admission, onward)    Start     Dose/Rate Route Frequency Ordered Stop   05/26/23 2030  azithromycin (ZITHROMAX) 500 mg in sodium chloride 0.9 % 250 mL IVPB  Status:  Discontinued        500 mg 250 mL/hr over 60 Minutes Intravenous  Once 05/26/23 2023 05/26/23 2036   05/26/23 2030  cefTRIAXone (ROCEPHIN) 2 g in sodium chloride 0.9 % 100 mL IVPB  Status:  Discontinued        2 g 200 mL/hr over 30 Minutes Intravenous  Once 05/26/23 2023 05/26/23 2036   05/26/23 1915  cefTRIAXone (ROCEPHIN) 2 g in sodium chloride 0.9 % 100 mL IVPB  Status:  Discontinued        2 g 200 mL/hr over 30 Minutes Intravenous Every 24 hours 05/26/23 1904 05/26/23 2023   05/26/23 1915  azithromycin (ZITHROMAX) 500 mg in sodium chloride 0.9 % 250 mL IVPB  Status:  Discontinued        500 mg 250 mL/hr over 60 Minutes Intravenous Every 24 hours 05/26/23 1904 05/26/23 2023        Assessment/Plan 57 yo male s/p ground-level fall with syncope. He has fractures of R ribs 3, 4, 5, 6, 9, and 10, with likely mild pulmonary contusions - Recommend PT/OT, multimodal pain control, pulm toilet (IS) - AM CXR w/o  PTX  Tertiary Survey: Trauma scans reviewed: Yes Additional Imaging recommendations: No Labs reviewed: Yes Additional lab work recommendations: No  Does the patient have any new complaints? No Cervical Collar Cleared: Yes DVT ppx ordered? On Eliquis Is patient age > 53 (57 y.o.) : No  Exam: As above  Incidental Findings: Multiple circumscribed low-attenuation lesions demonstrated throughout the liver. L midpole renal cyst. 4.6 cm diameter ascending thoracic aortic aneurysm. Defer to primary. Recommend pcp f/u  Wound Care: Local wound care for R forehead abrasion. Can wash with soap and water daily. Bacitracin   Follow-up Appointments Recommended: PCP  Per [facility] Syncope HTN HLD Hypothyroidism Hx PE on Eliquis  Thoracic aortic aneurysm  FEN - Okay for diet. IVF per primary VTE - SCDs, Eliquis  ID - None Foley - None, voiding.  Plan - Trauma will sign off. See recs as above. Call back with any questions or concerns.   I reviewed nursing notes, hospitalist  notes, last 24 h vitals and pain scores, last 48 h intake and output, last 24 h labs and trends, and last 24 h imaging results.    LOS: 1 day    Jacinto Halim , [facility] Surgery 05/27/2023, 8:36 AM Please see Amion for pager number during day hours 7:00am-4:30pm

## 2023-05-27 NOTE — TOC CM/SW Note (Signed)
Transition of Care [facility] - Inpatient Brief Assessment   Patient Details  Name: [name] MRN: [id number] Date of Birth: 11-02-1966  Transition of [facility]) CM/SW Contact:    Darrold Span, RN Phone Number: 05/27/2023, 11:20 AM   Clinical Narrative: Pt admitted s/p fall with multiple rib fractures, pt has PCP noted and insurance coverage. Transition of Care Department [facility]) has reviewed patient and we will continue to monitor patient advancement through interdisciplinary progression rounds. If new patient transition needs arise, please place a TOC consult.   Transition of Care Asessment: Insurance and Status: Insurance coverage has been reviewed Patient has primary care physician: Yes Home environment has been reviewed: home Prior level of function:: independent Prior/Current Home Services: No current home services Social Determinants of Health Reivew: SDOH reviewed no interventions necessary Readmission risk has been reviewed: Yes Transition of care needs: no transition of care needs at this time

## 2023-05-28 ENCOUNTER — Other Ambulatory Visit (HOSPITAL_COMMUNITY): Payer: Self-pay

## 2023-05-28 DIAGNOSIS — S2249XA Multiple fractures of ribs, unspecified side, initial encounter for closed fracture: Secondary | ICD-10-CM | POA: Diagnosis not present

## 2023-05-28 LAB — BASIC METABOLIC PANEL
Anion gap: 8 (ref 5–15)
BUN: 15 mg/dL (ref 6–20)
CO2: 27 mmol/L (ref 22–32)
Calcium: 8.1 mg/dL — ABNORMAL LOW (ref 8.9–10.3)
Chloride: 105 mmol/L (ref 98–111)
Creatinine, Ser: 1.2 mg/dL (ref 0.61–1.24)
GFR, Estimated: >60 mL/min (ref >60)
Glucose, Bld: 95 mg/dL (ref 70–99)
Potassium: 4.2 mmol/L (ref 3.5–5.1)
Sodium: 140 mmol/L (ref 135–145)

## 2023-05-28 LAB — CULTURE, BLOOD (ROUTINE X 2)
Culture: NO GROWTH
Culture: NO GROWTH

## 2023-05-28 LAB — CBC
HCT: 42.9 % (ref 39.0–52.0)
Hemoglobin: 14.4 g/dL (ref 13.0–17.0)
MCH: 33.3 pg (ref 26.0–34.0)
MCHC: 33.6 g/dL (ref 30.0–36.0)
MCV: 99.1 fL (ref 80.0–100.0)
Platelets: 150 10*3/uL (ref 150–400)
RBC: 4.33 MIL/uL (ref 4.22–5.81)
RDW: 13.3 % (ref 11.5–15.5)
WBC: 6.5 10*3/uL (ref 4.0–10.5)
nRBC: 0 % (ref 0.0–0.2)

## 2023-05-28 LAB — TSH: TSH: 0.739 u[IU]/mL (ref 0.350–4.500)

## 2023-05-28 MED ORDER — ACETAMINOPHEN 325 MG PO TABS: 650.0000 mg | ORAL_TABLET | Freq: Four times a day (QID) | ORAL | Status: DC

## 2023-05-28 MED ORDER — SPIRONOLACTONE 25 MG PO TABS: 25.0000 mg | ORAL_TABLET | Freq: Every day | ORAL | 1 refills | Status: DC

## 2023-05-28 MED ORDER — VALSARTAN 320 MG PO TABS: 320.0000 mg | ORAL_TABLET | Freq: Every day | ORAL | 3 refills | Status: DC

## 2023-05-28 MED ORDER — METHOCARBAMOL 500 MG PO TABS
500.0000 mg | ORAL_TABLET | Freq: Three times a day (TID) | ORAL | 0 refills | Status: DC | PRN
  Filled 2023-05-28: qty 20, 7d supply, fill #0

## 2023-05-28 MED ORDER — OXYCODONE HCL 5 MG PO TABS
5.0000 mg | ORAL_TABLET | ORAL | 0 refills | Status: DC | PRN
  Filled 2023-05-28: qty 20, 4d supply, fill #0

## 2023-05-28 NOTE — Discharge Summary (Signed)
DISCHARGE SUMMARY  [name]  MR#: [id number]  DOB:[birth year]  Date of Admission: 05/26/2023 Date of Discharge: 05/28/2023  Attending Physician:Jaevon Paras Silvestre Gunner, MD  Patient's EXB:MWUX, Darlen Round, MD  Consults: Trauma Surgery   Disposition: D/C home   Follow-up Appts:  Follow-up Information     Daisy Floro, MD Follow up in 1 week(s).   Specialty: Family Medicine Contact information: [address] [place] [address] [phone]                 Tests Needing Follow-up: -assess for persisting sinus bradycardia (BB discontinued this admit) -recheck electrolytes - usual home dose of KCl stopped at time of d/c as K+ was normal w/o supplementation   Discharge Diagnoses: Multiple right-sided rib fracture status post fall Syncope Sinus bradycardia Acute kidney injury History of recurrent unprovoked PE HTN Hypothyroidism Known ascending thoracic aortic aneurysm - bicuspid aortic valve  Obesity - There is no height or weight on file to calc  Initial presentation: 57 year old with a history of HTN, HLD, PE and DVT in 2011 & 2021 on Eliquis, hypothyroidism, CAD, and ascending thoracic aortic aneurysm who reported to the ER after suffering a syncopal event.  He was simply getting out of his car after a short trip to a local retailer when he lost consciousness without warning and fell to the ground.  He immediately awoke with pain to his right flank with deep breath and movement.  In the ER CT head and cervical spine was unrevealing.  Though CXR was unrevealing CT chest suggested acute displaced fractures of multiple ribs of the right chest.   Hospital Course:  Multiple right-sided rib fracture status post fall No further evaluation or intervention needed per Trauma Surgery - pain controlled at time of d/c - encouraged ongoing use of IS   Syncope Likely orthostatic event related to simple dehydration - no chest pain or worrisome preceding  symptoms - bradycardia could be contributing as noted below -TTE noted preserved LV systolic function with EF 60-65% with no WMA and no significant valvular abnormalities apart from mild thickening of mitral valve leaflets without evidence of stenosis or regurgitation   Sinus bradycardia Beta-blocker discontinued this admission in the setting of unexplained syncope with heart rates 40-50 - could be contributing to above, especially in setting of dehydration - discussed with patient -assess heart rate and outpatient follow-up   Acute kidney injury Baseline creatinine 1.0 with creatinine 1.55 on admission -likely simple prerenal azotemia complicated by use of ARB and diuretic -held ARB and diuretic during admission -creatinine approaching baseline at time of discharge/improving -continue to hold ARB and diuretic for 3 additional days then resume as outpatient  Reported hx of CAD? - INCORRECT No symptoms to suggest acute issues - cardiac cath in 2017 actually noted NORMAL CORONARIES    History of PE Continue chronic Eliquis -had apparent spontaneous unprovoked episodes in 2011 and 2021   HTN Blood pressure low normal at presentation, felt to be related to dehydration -as noted above ARB and diuretic will be held for 3 additional days after discharge and then resumed as per home dose -resume amlodipine at time of discharge   Hypothyroidism Continue usual Synthroid dose   Known ascending thoracic aortic aneurysm - bicuspid aortic valve  Continue outpatient follow-up with semiannual imaging -known to and followed by Cardiology -of note aortic valve was reportedly felt to be tricuspid on TTE this admission   Obesity - There is no height or weight on file  to calculate BMI.  Allergies as of 05/28/2023       Reactions   Latex Rash        Medication List     STOP taking these medications    methylPREDNISolone 4 MG Tbpk tablet Commonly known as: MEDROL DOSEPAK   metoprolol succinate 50 MG  24 hr tablet Commonly known as: TOPROL-XL   potassium chloride SA 20 MEQ tablet Commonly known as: KLOR-CON M   predniSONE 10 MG tablet Commonly known as: DELTASONE       TAKE these medications    acetaminophen 325 MG tablet Commonly known as: TYLENOL Take 2 tablets (650 mg total) by mouth 4 (four) times daily. What changed:  medication strength how much to take when to take this reasons to take this   amLODipine 10 MG tablet Commonly known as: NORVASC TAKE 1 TABLET BY MOUTH EVERY DAY   apixaban 5 MG Tabs tablet Commonly known as: Eliquis Take 1 tablet (5 mg total) by mouth 2 (two) times daily.   clindamycin 1 % lotion Commonly known as: Cleocin-T Apply topically daily. Apply topically daily to the affected areas What changed:  how much to take when to take this reasons to take this additional instructions   clobetasol cream 0.05 % Commonly known as: TEMOVATE Apply 1 Application topically 2 (two) times daily. Apply 2 x daily for 2 weeks then stop. Restart 2 weeks What changed:  when to take this reasons to take this additional instructions   levothyroxine 112 MCG tablet Commonly known as: SYNTHROID Take 280 mcg by mouth every Monday, Wednesday, and Friday.   methocarbamol 500 MG tablet Commonly known as: ROBAXIN Take 1 tablet (500 mg total) by mouth every 8 (eight) hours as needed for muscle spasms.   nitroGLYCERIN 0.4 MG SL tablet Commonly known as: NITROSTAT Place 0.4 mg under the tongue every 5 (five) minutes x 3 doses as needed for chest pain.   oxyCODONE 5 MG immediate release tablet Commonly known as: Oxy IR/ROXICODONE Take 1 tablet (5 mg total) by mouth every 4 (four) hours as needed for moderate pain.   sertraline 100 MG tablet Commonly known as: ZOLOFT Take 200 mg by mouth daily.   silver sulfADIAZINE 1 % cream Commonly known as: Silvadene Apply 1 Application topically 2 (two) times daily. Apply 2 times daily to affected areas when not  using Clobetasol cream What changed:  when to take this reasons to take this additional instructions   spironolactone 25 MG tablet Commonly known as: ALDACTONE Take 1 tablet (25 mg total) by mouth daily. Start taking on: May 31, 2023 What changed: These instructions start on May 31, 2023. If you are unsure what to do until then, ask your doctor or other care provider.   tacrolimus 0.1 % ointment Commonly known as: PROTOPIC Apply topically 2 (two) times daily. Apply topically to the affected areas for up to two weeks alternating with Triamcinolone   triamcinolone 0.1%-silver sulfadiazine 1:1 cream mixture Apply topically 2 (two) times daily. Apply to the affected areas twice daily for up to two weeks alternating with Tacrolimus   valsartan 320 MG tablet Commonly known as: DIOVAN Take 1 tablet (320 mg total) by mouth daily. Start taking on: May 31, 2023 What changed: These instructions start on May 31, 2023. If you are unsure what to do until then, ask your doctor or other care provider.        Day of Discharge BP (!) 142/86 (BP Location: Left Arm)  Pulse (!) 59   Temp 97.9 F (36.6 C) (Oral)   Resp 17   SpO2 93%   Physical Exam: General: No acute respiratory distress Lungs: Clear to auscultation bilaterally without wheezes or crackles Cardiovascular: Regular rate and rhythm without murmur gallop or rub normal S1 and S2 Abdomen: Nontender, nondistended, soft, bowel sounds positive, no rebound, no ascites, no appreciable mass Extremities: No significant cyanosis, clubbing, or edema bilateral lower extremities  Basic Metabolic Panel: Recent Labs  Lab 05/26/23 1703 05/26/23 1724 05/27/23 0755 05/28/23 0746  NA 139 141 140 140  K 3.9 3.7 4.7 4.2  CL 106 105 104 105  CO2 21*  --  27 27  GLUCOSE 100* 102* 90 95  BUN 19 19 21* 15  CREATININE 1.55* 1.50* 1.26* 1.20  CALCIUM 9.1  --  8.6* 8.1*    CBC: Recent Labs  Lab 05/26/23 1703 05/26/23 1724  05/27/23 0755 05/28/23 0746  WBC 16.1*  --  7.9 6.5  HGB 16.5 16.7 15.0 14.4  HCT 50.0 49.0 44.8 42.9  MCV 97.7  --  99.3 99.1  PLT 214  --  172 150    Time spent in discharge (includes decision making & examination of pt): 35 minutes  05/28/2023, 9:06 AM   Lonia Blood, MD [facility] Office  [phone]

## 2023-05-29 LAB — CULTURE, BLOOD (ROUTINE X 2)

## 2023-05-30 LAB — CULTURE, BLOOD (ROUTINE X 2)

## 2023-05-31 LAB — CULTURE, BLOOD (ROUTINE X 2)
Special Requests: ADEQUATE
Special Requests: ADEQUATE

## 2023-06-21 NOTE — Progress Notes (Unsigned)
Cardiology Office Note:    Date:  06/22/2023  ID:  [name], DOB 1966/06/06, MRN [id number] PCP: Daisy Floro, MD  [facility] Providers Cardiologist:  Lance Muss, MD       Patient Profile:      Bicuspid aortic valve Thoracic aortic aneurysm  Cardiac MRI 12/03/15: EF 59, mild focal basal LVH, bicuspid AV, TAA 4.5 cm Chest CTA 08/29/21: 4.5 cm  Chest/Abd/Pelvis CTA 05/26/23: 4.6 cm Asc TAA, no dissection  Coronary artery Ca2+ Chest pain  LHC 12/08/16: EF 65, no CAD  Myoview 09/04/21: no ischemia, no infarction, EF 67, low risk  Syncope TTE 05/27/23: 60-65, no RWMA, mild LVH, NL RVSF, AV sclerosis, ascending aorta 44 mm, RAP 3  Hypertension  Hyperlipidemia  Hx of DVT in 2011, 04/2020 Eliquis Rx Aortic atherosclerosis  Obesity       History of Present Illness:   [name] is a 57 y.o. male who returns for post hospital follow up. He was last seen by Dr. Eldridge Dace 02/22/23. He was admx to [facility]7 with unexplained syncope resulting in multiple R sided rib fx. Echocardiogram showed normal EF. CT showed stable TAA at 4.6 cm. HRs were 40-50 and his beta-blocker was DC'd. He was noted to have AKI on admission and it was thought that his syncope could be related to orthostasis/dehydration. ARB/Spironolactone were held, but resumed at discharge.  He is here alone.  Since discharge in the hospital, he has not had any further syncope.  He has had 2 near syncopal episodes.  Once was while taking a hot shower.  Another time was after standing up from bending over.  He has chronic chest pain.  He has had this for years without significant change.  He also notes chronic shortness of breath.  This is also stable without change.  He is NYHA IIb.  He has not had orthopnea.  He has some mild ankle edema from time to time.  He does not smoke.  Review of Systems  Gastrointestinal:  Positive for diarrhea (chronic). Negative for hematochezia, melena and vomiting.   Genitourinary:  Negative for hematuria.   See HPI    Studies Reviewed:   EKG Interpretation Date/Time:  Tuesday June 22 2023 11:13:25 EDT Ventricular Rate:  88 PR Interval:  166 QRS Duration:  86 QT Interval:  358 QTC Calculation: 433 R Axis:   35  Text Interpretation: Normal sinus rhythm Possible Anterior infarct , age undetermined No change when compared to prior ECGs Confirmed by Tereso Newcomer [phone]) on 06/22/2023 11:33:35 AM    Risk Assessment/Calculations:             Physical Exam:   VS:  BP 128/86   Pulse 98   Ht 6\' 3"  (1.905 m)   Wt 282 lb 3.2 oz (128 kg)   SpO2 93%   BMI 35.27 kg/m    Orthostatic VS for the past 24 hrs (Last 3 readings):  BP- Lying Pulse- Lying BP- Sitting Pulse- Sitting BP- Standing at 0 minutes Pulse- Standing at 0 minutes BP- Standing at 3 minutes  06/22/23 1106 133/84 101 120/82 91 92/61 102 105/69    Wt Readings from Last 3 Encounters:  06/22/23 282 lb 3.2 oz (128 kg)  02/22/23 286 lb 12.8 oz (130.1 kg)  01/06/22 283 lb (128.4 kg)    Constitutional:      Appearance: Healthy appearance. Not in distress.  Neck:     Vascular: JVD normal.  Pulmonary:  Breath sounds: Normal breath sounds. No wheezing. No rales.  Cardiovascular:     Normal rate. Regular rhythm.     Murmurs: There is no murmur.  Edema:    Peripheral edema absent.  Abdominal:     Palpations: Abdomen is soft.      ASSESSMENT AND PLAN:   Syncope ?Unexplained.  He does have evidence of orthostatic hypotension on exam today.  There were concerns for dehydration due to AKI when he was admitted to the hospital.  Therefore, it would seem reasonable that orthostatic hypotension was the cause of his syncope.  However, he notes that he had no warning.  He did not feel poorly or near syncopal before he passed out.  He also has several seconds that he cannot recollect.  He was noted to have bradycardia in the hospital.  I reviewed the strips that are available in epic.  Heart rates  were in the upper 50s.  His metoprolol was stopped.  Heart rate today is 88 on electrocardiogram.  He does note elevated heart rates when he gets near syncopal.  Echocardiogram in the hospital demonstrated normal LV function.  He has a history of hypokalemia and hypertension.  Question if he has hyperaldosteronism.  CT scan did not demonstrate any adrenal hyperplasia.  As he is on a diuretic, I would like to decrease this.  However, I am somewhat hesitant as he may develop worsening hypokalemia. No driving for 6 months Obtain 30-day event monitor Decrease spironolactone to 12.5 mg daily BMET today Follow-up 2 to 3 months  Hypertension Blood pressure is well-controlled.  He does have evidence of orthostatic hypotension on exam today.  As noted, he does have a history of hypokalemia.  I will decrease his spironolactone to 12.5 mg daily.  Obtain follow-up BMET today.  He was taken off of potassium in the hospital.  This may need to be resumed.  Continue amlodipine 10 mg daily, valsartan 320 mg daily.  Orthostatic hypotension Decrease spironolactone as noted.  I have recommended wearing knee-high compression stockings.  Recent hemoglobin was normal at 15.2, TSH normal at 3.45.  Aneurysm of thoracic aorta (HCC) CT scan in the hospital did demonstrate stable size at 4.6 cm.  Continue regular surveillance.  Bicuspid aortic valve Echocardiogram in the hospital did not demonstrate evidence of aortic stenosis or aortic insufficiency.  CAD (coronary artery disease) History of coronary calcification on prior CT.  Cardiac catheterization in 2017 demonstrated no CAD.  Stress test in 2022 was low risk.  He has chronic chest pain without significant change.  He is not on antiplatelet therapy as he is on Eliquis.  History of pulmonary embolus (PE) He remains on chronic anticoagulation with Eliquis 5 mg twice daily..    Dispo:  Return in about 3 months (around 09/22/2023) for Follow up after testing with Dr.  Eldridge Dace.  Signed, Tereso Newcomer, PA-C

## 2023-06-22 ENCOUNTER — Ambulatory Visit: Payer: Medicaid Other | Attending: Physician Assistant | Admitting: Physician Assistant

## 2023-06-22 ENCOUNTER — Encounter: Payer: Self-pay | Admitting: *Deleted

## 2023-06-22 ENCOUNTER — Encounter: Payer: Self-pay | Admitting: Physician Assistant

## 2023-06-22 VITALS — BP 128/86 | HR 98 | Ht 75.0 in | Wt 282.2 lb

## 2023-06-22 DIAGNOSIS — I10 Essential (primary) hypertension: Secondary | ICD-10-CM

## 2023-06-22 DIAGNOSIS — I7121 Aneurysm of the ascending aorta, without rupture: Secondary | ICD-10-CM | POA: Diagnosis not present

## 2023-06-22 DIAGNOSIS — I951 Orthostatic hypotension: Secondary | ICD-10-CM

## 2023-06-22 DIAGNOSIS — Z86711 Personal history of pulmonary embolism: Secondary | ICD-10-CM

## 2023-06-22 DIAGNOSIS — I2510 Atherosclerotic heart disease of native coronary artery without angina pectoris: Secondary | ICD-10-CM | POA: Diagnosis not present

## 2023-06-22 DIAGNOSIS — R55 Syncope and collapse: Secondary | ICD-10-CM

## 2023-06-22 DIAGNOSIS — Q231 Congenital insufficiency of aortic valve: Secondary | ICD-10-CM

## 2023-06-22 LAB — BASIC METABOLIC PANEL
BUN/Creatinine Ratio: 12 (ref 9–20)
BUN: 14 mg/dL (ref 6–24)
CO2: 24 mmol/L (ref 20–29)
Calcium: 9.4 mg/dL (ref 8.7–10.2)
Chloride: 104 mmol/L (ref 96–106)
Creatinine, Ser: 1.14 mg/dL (ref 0.76–1.27)
Glucose: 98 mg/dL (ref 70–99)
Potassium: 3.2 mmol/L — ABNORMAL LOW (ref 3.5–5.2)
Sodium: 142 mmol/L (ref 134–144)
eGFR: 76 mL/min/{1.73_m2} (ref >59)

## 2023-06-22 MED ORDER — SPIRONOLACTONE 25 MG PO TABS: 12.5000 mg | ORAL_TABLET | Freq: Every day | ORAL | 3 refills | Status: DC

## 2023-06-22 NOTE — Patient Instructions (Signed)
Medication Instructions:  Your physician has recommended you make the following change in your medication:  DECREASE spironolactone 25mg  to one half (1/2) tablet daily.   *If you need a refill on your cardiac medications before your next appointment, please call your pharmacy*   Lab Work: BMET today.  If you have labs (blood work) drawn today and your tests are completely normal, you will receive your results only by: MyChart Message (if you have MyChart) OR A paper copy in the mail If you have any lab test that is abnormal or we need to change your treatment, we will call you to review the results.   Testing/Procedures:  [employer] Cardiac Event Monitor Instructions  Your physician has requested you wear your cardiac event monitor for ___30__ days, (1-30). [employer] may call or text to confirm a shipping address. The monitor will be sent to a land address via UPS. [employer] will not ship a monitor to a PO BOX. It typically takes 3-5 days to receive your monitor after it has been enrolled. [employer] will assist with USPS tracking if your package is delayed. The telephone number for [employer] is [phone]. Once you have received your monitor, please review the enclosed instructions. Instruction tutorials can also be viewed under help and settings on the enclosed cell phone. Your monitor has already been registered assigning a specific monitor serial # to you.  Billing and Self Pay Discount Information  [employer] has been provided the insurance information we had on file for you.  If your insurance has been updated, please call [employer] at [phone] to provide them with your updated insurance information.   [employer] offers a discounted Self Pay option for patients who have insurance that does not cover their cardiac event monitor or patients without insurance.  The discounted cost of a Self Pay Cardiac Event Monitor would be $225.00 , if the patient contacts [employer]  at [phone] within 7 days of applying the monitor to make payment arrangements.  If the patient does not contact [employer] within 7 days of applying the monitor, the cost of the cardiac event monitor will be $350.00.  Applying the monitor  Remove cell phone from case and turn it on. The cell phone works as IT consultant and needs to be within [employer] of you at all times. The cell phone will need to be charged on a daily basis. We recommend you plug the cell phone into the enclosed charger at your bedside table every night.  Monitor batteries: You will receive two monitor batteries labelled #1 and #2. These are your recorders. Plug battery #2 onto the second connection on the enclosed charger. Keep one battery on the charger at all times. This will keep the monitor battery deactivated. It will also keep it fully charged for when you need to switch your monitor batteries. A small light will be blinking on the battery emblem when it is charging. The light on the battery emblem will remain on when the battery is fully charged.  Open package of a Monitor strip. Insert battery #1 into black hood on strip and gently squeeze monitor battery onto connection as indicated in instruction booklet. Set aside while preparing skin.  Choose location for your strip, vertical or horizontal, as indicated in the instruction booklet. Shave to remove all hair from location. There cannot be any lotions, oils, powders, or colognes on skin where monitor is to be applied. Wipe skin clean with enclosed Saline wipe. Dry skin completely.  Peel paper labeled #1 off  the back of the Monitor strip exposing the adhesive. Place the monitor on the chest in the vertical or horizontal position shown in the instruction booklet. One arrow on the monitor strip must be pointing upward. Carefully remove paper labeled #2, attaching remainder of strip to your skin. Try not to create any folds or wrinkles in the strip as you apply  it.  Firmly press and release the circle in the center of the monitor battery. You will hear a small beep. This is turning the monitor battery on. The heart emblem on the monitor battery will light up every 5 seconds if the monitor battery in turned on and connected to the patient securely. Do not push and hold the circle down as this turns the monitor battery off. The cell phone will locate the monitor battery. A screen will appear on the cell phone checking the connection of your monitor strip. This may read poor connection initially but change to good connection within the next minute. Once your monitor accepts the connection you will hear a series of 3 beeps followed by a climbing crescendo of beeps. A screen will appear on the cell phone showing the two monitor strip placement options. Touch the picture that demonstrates where you applied the monitor strip.  Your monitor strip and battery are waterproof. You are able to shower, bathe, or swim with the monitor on. They just ask you do not submerge deeper than 3 feet underwater. We recommend removing the monitor if you are swimming in a lake, river, or ocean.  Your monitor battery will need to be switched to a fully charged monitor battery approximately once a week. The cell phone will alert you of an action which needs to be made.  On the cell phone, tap for details to reveal connection status, monitor battery status, and cell phone battery status. The green dots indicates your monitor is in good status. A red dot indicates there is something that needs your attention.  To record a symptom, click the circle on the monitor battery. In 30-60 seconds a list of symptoms will appear on the cell phone. Select your symptom and tap save. Your monitor will record a sustained or significant arrhythmia regardless of you clicking the button. Some patients do not feel the heart rhythm irregularities. [employer] will notify us of any serious or  critical events.  Refer to instruction booklet for instructions on switching batteries, changing strips, the Do not disturb or Pause features, or any additional questions.  Call [employer] at [phone], to confirm your monitor is transmitting and record your baseline. They will answer any questions you may have regarding the monitor instructions at that time.  Returning the monitor to [employer]  Place all equipment back into blue box. Peel off strip of paper to expose adhesive and close box securely. There is a prepaid UPS shipping label on this box. Drop in a UPS drop box, or at a UPS facility like [employer]. You may also contact [employer] to arrange UPS to pick up monitor package at your home.     Follow-Up: At [facility], you and your health needs are our priority.  As part of our continuing mission to provide you with exceptional heart care, we have created designated Provider Care Teams.  These Care Teams include your primary Cardiologist (physician) and Advanced Practice Providers (APPs -  Physician Assistants and Nurse Practitioners) who all work together to provide you with the care you need, when you need it.  We recommend  signing up for the patient portal called "MyChart".  Sign up information is provided on this After Visit Summary.  MyChart is used to connect with patients for Virtual Visits (Telemedicine).  Patients are able to view lab/test results, encounter notes, upcoming appointments, etc.  Non-urgent messages can be sent to your provider as well.   To learn more about what you can do with MyChart, go to [email or web address].    Your next appointment:   2 month(s)  Provider:   Tereso Newcomer, PA-C         Other Instructions Your physician recommends you wear compression stockings knee high.

## 2023-06-22 NOTE — Progress Notes (Signed)
Patient enrolled for [employer]/ [employer] to ship a 30 day cardiac event monitor to his address on file.  Dr. Eldridge Dace to read.

## 2023-06-22 NOTE — Assessment & Plan Note (Signed)
Blood pressure is well-controlled.  He does have evidence of orthostatic hypotension on exam today.  As noted, he does have a history of hypokalemia.  I will decrease his spironolactone to 12.5 mg daily.  Obtain follow-up BMET today.  He was taken off of potassium in the hospital.  This may need to be resumed.  Continue amlodipine 10 mg daily, valsartan 320 mg daily.

## 2023-06-22 NOTE — Assessment & Plan Note (Signed)
Echocardiogram in the hospital did not demonstrate evidence of aortic stenosis or aortic insufficiency.

## 2023-06-22 NOTE — Assessment & Plan Note (Signed)
He remains on chronic anticoagulation with Eliquis 5 mg twice daily.Marland Kitchen

## 2023-06-22 NOTE — Assessment & Plan Note (Signed)
CT scan in the hospital did demonstrate stable size at 4.6 cm.  Continue regular surveillance.

## 2023-06-22 NOTE — Assessment & Plan Note (Signed)
Decrease spironolactone as noted.  I have recommended wearing knee-high compression stockings.  Recent hemoglobin was normal at 15.2, TSH normal at 3.45.

## 2023-06-22 NOTE — Assessment & Plan Note (Signed)
?  Unexplained.  He does have evidence of orthostatic hypotension on exam today.  There were concerns for dehydration due to AKI when he was admitted to the hospital.  Therefore, it would seem reasonable that orthostatic hypotension was the cause of his syncope.  However, he notes that he had no warning.  He did not feel poorly or near syncopal before he passed out.  He also has several seconds that he cannot recollect.  He was noted to have bradycardia in the hospital.  I reviewed the strips that are available in epic.  Heart rates were in the upper 50s.  His metoprolol was stopped.  Heart rate today is 88 on electrocardiogram.  He does note elevated heart rates when he gets near syncopal.  Echocardiogram in the hospital demonstrated normal LV function.  He has a history of hypokalemia and hypertension.  Question if he has hyperaldosteronism.  CT scan did not demonstrate any adrenal hyperplasia.  As he is on a diuretic, I would like to decrease this.  However, I am somewhat hesitant as he may develop worsening hypokalemia. No driving for 6 months Obtain 30-day event monitor Decrease spironolactone to 12.5 mg daily BMET today Follow-up 2 to 3 months

## 2023-06-22 NOTE — Assessment & Plan Note (Signed)
History of coronary calcification on prior CT.  Cardiac catheterization in 2017 demonstrated no CAD.  Stress test in 2022 was low risk.  He has chronic chest pain without significant change.  He is not on antiplatelet therapy as he is on Eliquis.

## 2023-06-23 ENCOUNTER — Telehealth: Payer: Self-pay | Admitting: *Deleted

## 2023-06-23 DIAGNOSIS — Z79899 Other long term (current) drug therapy: Secondary | ICD-10-CM

## 2023-06-23 MED ORDER — POTASSIUM CHLORIDE CRYS ER 20 MEQ PO TBCR: 20.0000 meq | EXTENDED_RELEASE_TABLET | Freq: Two times a day (BID) | ORAL | 1 refills | Status: DC

## 2023-06-23 NOTE — Telephone Encounter (Signed)
-----   Message from Beatrice Lecher, [place] sent at 06/23/2023  1:05 PM EDT ----- Results sent to [name] via MyChart. See MyChart comments below. PLAN:  -Start K+ 20 mEq twice daily  -BMET 1 week  Mr. Coots  Your potassium is low.  Your kidney function (creatinine) is normal.  Your potassium will likely get lower with decreasing the dose of spironolactone.  Therefore, I will place you back on potassium 20 mEq twice daily and recheck blood work in a week. Tereso Newcomer, PA-C

## 2023-06-30 ENCOUNTER — Ambulatory Visit: Payer: Medicaid Other | Attending: Cardiology

## 2023-06-30 DIAGNOSIS — R55 Syncope and collapse: Secondary | ICD-10-CM

## 2023-06-30 DIAGNOSIS — Z79899 Other long term (current) drug therapy: Secondary | ICD-10-CM

## 2023-07-01 ENCOUNTER — Other Ambulatory Visit: Payer: Self-pay

## 2023-07-01 DIAGNOSIS — E876 Hypokalemia: Secondary | ICD-10-CM

## 2023-07-01 DIAGNOSIS — Z79899 Other long term (current) drug therapy: Secondary | ICD-10-CM

## 2023-07-01 LAB — BASIC METABOLIC PANEL
BUN/Creatinine Ratio: 14 (ref 9–20)
BUN: 13 mg/dL (ref 6–24)
CO2: 20 mmol/L (ref 20–29)
Calcium: 8.9 mg/dL (ref 8.7–10.2)
Chloride: 105 mmol/L (ref 96–106)
Creatinine, Ser: 0.93 mg/dL (ref 0.76–1.27)
Glucose: 98 mg/dL (ref 70–99)
Potassium: 3.4 mmol/L — ABNORMAL LOW (ref 3.5–5.2)
Sodium: 142 mmol/L (ref 134–144)
eGFR: 98 mL/min/{1.73_m2} (ref >59)

## 2023-07-01 NOTE — Progress Notes (Signed)
Placed an order for repeat BMP d/t low Potassium level 3.4.

## 2023-07-02 ENCOUNTER — Telehealth: Payer: Self-pay | Admitting: *Deleted

## 2023-07-02 MED ORDER — POTASSIUM CHLORIDE CRYS ER 20 MEQ PO TBCR: 40.0000 meq | EXTENDED_RELEASE_TABLET | Freq: Two times a day (BID) | ORAL | Status: DC

## 2023-07-02 NOTE — Telephone Encounter (Signed)
-----   Message from Tereso Newcomer sent at 07/01/2023  5:40 PM EDT ----- Creatinine normal. K+ low. Current Rx: K+ 20 mEq twice daily  Triage RN instructed pt to take extra K+ 20 mEq today and tomorrow.  PLAN: -Ok to take extra K+ 20 mEq today as instructed. -However, tomorrow change K+ to 40 mEq twice daily  -Agree with repeat BMET 7/16 Tereso Newcomer, PA-C    07/01/2023 5:39 PM

## 2023-07-06 ENCOUNTER — Ambulatory Visit (INDEPENDENT_AMBULATORY_CARE_PROVIDER_SITE_OTHER): Payer: Medicaid Other | Admitting: Dermatology

## 2023-07-06 ENCOUNTER — Encounter: Payer: Self-pay | Admitting: Dermatology

## 2023-07-06 ENCOUNTER — Ambulatory Visit: Payer: Medicaid Other | Attending: Physician Assistant

## 2023-07-06 VITALS — BP 137/92 | HR 91

## 2023-07-06 DIAGNOSIS — Q828 Other specified congenital malformations of skin: Secondary | ICD-10-CM | POA: Diagnosis not present

## 2023-07-06 DIAGNOSIS — Z79899 Other long term (current) drug therapy: Secondary | ICD-10-CM

## 2023-07-06 DIAGNOSIS — E876 Hypokalemia: Secondary | ICD-10-CM

## 2023-07-06 DIAGNOSIS — R55 Syncope and collapse: Secondary | ICD-10-CM

## 2023-07-06 MED ORDER — TACROLIMUS 0.1 % EX OINT: TOPICAL_OINTMENT | CUTANEOUS | 3 refills | Status: DC

## 2023-07-06 MED ORDER — CLOBETASOL PROPIONATE 0.05 % EX CREA: 1.0000 | TOPICAL_CREAM | Freq: Two times a day (BID) | CUTANEOUS | 2 refills | Status: DC

## 2023-07-06 NOTE — Patient Instructions (Addendum)
Thank you for visiting [place] today. We appreciate your dedication to managing your health and are pleased to see the improvements in your condition.  Here is a summary of the key instructions from today's consultation:  - Medication Routine: Continue alternating the following treatments:   - Clobetasol mixed with Silvadene: Apply twice a day for two weeks.   - Tacrolimus mixed with Silvadene: Apply twice a day for two weeks when not using Clobetasol.  - Medication Refills:   - A larger tube of Tacrolimus (up to 80 mg) has been prescribed to ensure a longer-lasting supply. - Follow-Up: Schedule a return visit in six months, or sooner if your symptoms are not controlled or if new issues arise.  - Skin Care for Device Tape: Use oil or saline strips to ease the removal of the cardiac monitor tape to prevent skin irritation. Ensure the skin is dry before applying the tape.  - Hydration: Continue to stay well-hydrated, especially on hot days, to prevent any further episodes of passing out.  Please keep [place] updated on your progress and do not hesitate to reach out if you have any concerns or if you need more medication before your next appointment.       Treatment Plan: -Continue Clobetasol oint mixed with silvadene apply twice daily for 2 weeks then off 2 weeks alternating with Tacrolimus.   Due to recent changes in healthcare laws, you may see results of your pathology and/or laboratory studies on MyChart before the doctors have had a chance to review them. We understand that in some cases there may be results that are confusing or concerning to you. Please understand that not all results are received at the same time and often the doctors may need to interpret multiple results in order to provide you with the best plan of care or course of treatment. Therefore, we ask that you please give [place] 2 business days to thoroughly review all your results before contacting the office for clarification.  Should we see a critical lab result, you will be contacted sooner.   If You Need Anything After Your Visit  If you have any questions or concerns for your doctor, please call our main line at [phone] If no one answers, please leave a voicemail as directed and we will return your call as soon as possible. Messages left after 4 pm will be answered the following business day.   You may also send [place] a message via MyChart. We typically respond to MyChart messages within 1-2 business days.  For prescription refills, please ask your pharmacy to contact our office. Our fax number is [phone].  If you have an urgent issue when the clinic is closed that cannot wait until the next business day, you can page your doctor at the number below.    Please note that while we do our best to be available for urgent issues outside of office hours, we are not available 24/7.   If you have an urgent issue and are unable to reach [place], you may choose to seek medical care at your doctor's office, retail clinic, urgent care center, or emergency room.  If you have a medical emergency, please immediately call 911 or go to the emergency department. In the event of inclement weather, please call our main line at [phone] for an update on the status of any delays or closures.  Dermatology Medication Tips: Please keep the boxes that topical medications come in in order to help keep track  of the instructions about where and how to use these. Pharmacies typically print the medication instructions only on the boxes and not directly on the medication tubes.   If your medication is too expensive, please contact our office at [phone] or send [place] a message through MyChart.   We are unable to tell what your co-pay for medications will be in advance as this is different depending on your insurance coverage. However, we may be able to find a substitute medication at lower cost or fill out paperwork to get insurance to  cover a needed medication.   If a prior authorization is required to get your medication covered by your insurance company, please allow [place] 1-2 business days to complete this process.  Drug prices often vary depending on where the prescription is filled and some pharmacies may offer cheaper prices.  The website www.goodrx.com contains coupons for medications through different pharmacies. The prices here do not account for what the cost may be with help from insurance (it may be cheaper with your insurance), but the website can give you the price if you did not use any insurance.  - You can print the associated coupon and take it with your prescription to the pharmacy.  - You may also stop by our office during regular business hours and pick up a GoodRx coupon card.  - If you need your prescription sent electronically to a different pharmacy, notify our office through [facility] or by phone at [phone]

## 2023-07-06 NOTE — Progress Notes (Signed)
Follow-Up Visit   Subjective  [name] is a 57 y.o. male who presents for the following: [name] disease. Patient was last evaluated on 05/06/23 and put on a Prednisone taper as well as Clobetasol 0.05% to mix with Silvadene which he uses 2 weeks on 2 weeks off. Patient reports things are 80-90% better. Patient reports Spironolactone to  half dose medication changes.   The patient, [name], presents with a history of [name] disease, which has been improving with the current treatment regimen. He reports using a mixture of Silvadene and clobetasol for six days, followed by a break, and then alternating with tacrolimus mixed with Silvadene for two weeks. The patient notes improvement in his neck, arms, and legs, with some areas drying up. However, he mentions a new patch on his leg that needs monitoring. The patient has difficulty applying medication to his back, where the disease is more severe.  [name] also reports a recent episode of passing out on June 5th, resulting in four broken ribs. He was taken to the hospital, where orthostatic hypotension was observed. He is currently wearing a heart monitor for a month to monitor for any abnormal cardiac rhythms. The patient has not experienced any further episodes of passing out since the initial event and has been wearing the monitor for a week and a half.  The following portions of the chart were reviewed this encounter and updated as appropriate: medications, allergies, medical history  Review of Systems:  No other skin or systemic complaints except as noted in HPI or Assessment and Plan.  Objective  Well appearing patient in no apparent distress; mood and affect are within normal limits.  A full examination was performed  of arms, legs, chest and back.   - Arms: Right arm showing improvement, left arm not shown this time, previously clear.   - Legs: Previously weeping, now dried up; new small patch noted on one  leg.   - Chest and Back: No examination of chest; back shows persistent erythematous plaques with superficial erosions and encrusting, indicative of severe [name] disease.   - Other Areas: Erythematous plaques noted on bilateral forearms, neck fold, abdomen, mammary creases; areas behind the knees are clearing.  Relevant exam findings are noted in the Assessment and Plan.  Exam: weeping superficial erosions involving intertriginous areas of axillae, arms and torso.               Assessment & Plan   1. [name] Disease - Assessment: Erythematous plaques with superficial erosions and encrusting observed on the back, bilateral forearms, neck fold, abdomen, mammary creases, and behind the knees, showing signs of improvement. - Plan: Continue the regimen of alternating clobetasol mixed with Silvadene twice a day for two weeks, followed by tacrolimus mixed with Silvadene twice a day for two weeks. Refill tacrolimus 80 mg tube. Schedule a follow-up in six months, or sooner if flare-ups not controlled by current treatment.  2. Syncope Episode and Rib Fractures - Assessment: Patient experienced a syncopal episode on June 5th, leading to four broken ribs. Currently, the patient is undergoing cardiac monitoring, with a week and a half of the prescribed one-month duration completed. - Plan: Continue with the ongoing cardiac monitoring as prescribed. Encourage hydration and vigilant monitoring for any further episodes of syncope. No adjustments needed for topical medications as they do not impact  cardiac function.   Return in about 6 months (around 01/06/2024) for F/U [name].  Owens Shark, CMA, am acting as  scribe for [name], DO.   Documentation: I have reviewed the above documentation for accuracy and completeness, and I agree with the above.  [name], DO

## 2023-07-07 LAB — BASIC METABOLIC PANEL
BUN/Creatinine Ratio: 10 (ref 9–20)
BUN: 11 mg/dL (ref 6–24)
CO2: 23 mmol/L (ref 20–29)
Calcium: 9.4 mg/dL (ref 8.7–10.2)
Chloride: 104 mmol/L (ref 96–106)
Creatinine, Ser: 1.13 mg/dL (ref 0.76–1.27)
Glucose: 88 mg/dL (ref 70–99)
Potassium: 3.8 mmol/L (ref 3.5–5.2)
Sodium: 142 mmol/L (ref 134–144)
eGFR: 77 mL/min/{1.73_m2} (ref >59)

## 2023-08-02 ENCOUNTER — Ambulatory Visit: Payer: Medicaid Other | Attending: Physician Assistant

## 2023-08-02 DIAGNOSIS — R55 Syncope and collapse: Secondary | ICD-10-CM

## 2023-08-03 ENCOUNTER — Encounter: Payer: Self-pay | Admitting: Physician Assistant

## 2023-09-02 NOTE — Progress Notes (Signed)
Cardiology Office Note:    Date:  09/03/2023  ID:  [name], DOB June 10, 1966, MRN [id number] PCP: Daisy Floro, MD  [facility] Providers Cardiologist:  Lance Muss, MD       Patient Profile:      Bicuspid aortic valve Thoracic aortic aneurysm  Cardiac MRI 12/03/15: EF 59, mild focal basal LVH, bicuspid AV, TAA 4.5 cm Chest CTA 08/29/21: 4.5 cm  Chest/Abd/Pelvis CTA 05/26/23: 4.6 cm Asc TAA, no dissection  Coronary artery Ca2+ Chest pain  LHC 12/08/16: EF 65, no CAD  Myoview 09/04/21: no ischemia, no infarction, EF 67, low risk  Syncope TTE 05/27/23: 60-65, no RWMA, mild LVH, NL RVSF, AV sclerosis, ascending aorta 44 mm, RAP 3  Monitor 06/2023: NSR, PACs, PVCs, no AFib or arrhythmias  Hypertension  Hyperlipidemia  Hx of DVT in 2011, 04/2020 Eliquis Rx Aortic atherosclerosis  Obesity           History of Present Illness:  Discussed the use of AI scribe software for clinical note transcription with the patient, who gave verbal consent to proceed.    [name] is a 57 y.o. male who returns for follow up of syncope. He was admx in 05/2023 with syncope. He had multiple R sided rib fx. EF was normal. Creatinine was elevated. His ortho VS were positive. I reduced his spironolactone. Follow up event monitor showed no arrhythmias.  He is here alone.  Since last seen, he has had 2 episodes of dizziness upon standing and rapid palpitations, consistent with orthostatic hypotension. The patient's beta blocker, metoprolol, was stopped during their hospitalization due to bradycardia.  He has chronic chest pain without change.  He has not had significant shortness of breath, leg swelling.  He has not had recurrent syncope.     ROS: See HPI.  He has occasional hemorrhoidal bleeding.  He is due for his follow-up colonoscopy.    Studies Reviewed:   EKG Interpretation Date/Time:  Friday September 03 2023 11:22:37 EDT Ventricular Rate:  84 PR Interval:  128 QRS  Duration:  86 QT Interval:  362 QTC Calculation: 427 R Axis:   48  Text Interpretation: Normal sinus rhythm Non-specific ST-t changes No significant change since last tracing Confirmed by Tereso Newcomer [phone]) on 09/03/2023 11:40:56 AM    Risk Assessment/Calculations:             Physical Exam:   VS:  BP 100/70   Pulse 84   Ht 6\' 3"  (1.905 m)   Wt 283 lb 6.4 oz (128.5 kg)   SpO2 96%   BMI 35.42 kg/m    Wt Readings from Last 3 Encounters:  09/03/23 283 lb 6.4 oz (128.5 kg)  06/22/23 282 lb 3.2 oz (128 kg)  02/22/23 286 lb 12.8 oz (130.1 kg)    Constitutional:      Appearance: Healthy appearance. Not in distress.  Neck:     Vascular: JVD normal.  Pulmonary:     Breath sounds: Normal breath sounds. No wheezing. No rales.  Cardiovascular:     Normal rate. Regular rhythm.     Murmurs: There is no murmur.  Edema:    Peripheral edema absent.  Abdominal:     Palpations: Abdomen is soft.        Assessment and Plan:     Orthostatic Hypotension Symptoms of dizziness upon standing with associated elevated heart rate and documented significant blood pressure drop.  I suspect he has an element of Postural Orthostatic Tachycardia Syndrome (  POTS).  His blood pressure today is low. -Decrease Valsartan to 160mg  daily, to be taken in the evening. -Continue Amlodipine 10mg  daily in the morning. -Resume Metoprolol Succinate at a lower dose of 25mg  daily every evening. -Advise wearing knee-high compression stockings. -Monitor blood pressure at home and notify if readings are too low or too high.  Syncope He was admitted in June.  He had no warning.  Overall, it is most likely he had orthostatic hypotension that led to syncope.  However, since he had no warning, I have recommended he continue to refrain from driving for total of 6 months after his event.  Thoracic Aortic Aneurysm 4.6cm by CT in June 2024. Discussed avoiding fluoroquinolone antibiotics and straining. -Continue  surveillance with annual CT scans.  Hypertension Blood pressure running somewhat low. Adjustments in blood pressure medications as noted above.  Hypokalemia Recent potassium normal. -Continue Potassium twice daily and Spironolactone 12.5mg  daily.  Recurrent DVT Remains on long-term anticoagulation with Eliquis 5mg  twice daily.  Coronary Artery Calcification 6 cardiac catheterization in 2017 negative for CAD.  Nuclear stress test in September 2022 negative for ischemia.  He has chronic chest discomfort which is unchanged.  No further testing needed.  Bicuspid Aortic Valve No significant aortic valve stenosis or regurgitation on last echocardiogram in June 2024. -Continue current management.         Dispo:  Return in about 3 months (around 12/03/2023) for Routine Follow Up w/ Dr. Izora Ribas, or Tereso Newcomer, PA-C.  Signed, Tereso Newcomer, PA-C

## 2023-09-03 ENCOUNTER — Encounter: Payer: Self-pay | Admitting: Physician Assistant

## 2023-09-03 ENCOUNTER — Ambulatory Visit: Payer: Medicaid Other | Attending: Physician Assistant | Admitting: Physician Assistant

## 2023-09-03 VITALS — BP 100/70 | HR 84 | Ht 75.0 in | Wt 283.4 lb

## 2023-09-03 DIAGNOSIS — I2510 Atherosclerotic heart disease of native coronary artery without angina pectoris: Secondary | ICD-10-CM | POA: Diagnosis not present

## 2023-09-03 DIAGNOSIS — Q231 Congenital insufficiency of aortic valve: Secondary | ICD-10-CM

## 2023-09-03 DIAGNOSIS — I951 Orthostatic hypotension: Secondary | ICD-10-CM | POA: Diagnosis not present

## 2023-09-03 DIAGNOSIS — I10 Essential (primary) hypertension: Secondary | ICD-10-CM

## 2023-09-03 DIAGNOSIS — R55 Syncope and collapse: Secondary | ICD-10-CM

## 2023-09-03 DIAGNOSIS — E876 Hypokalemia: Secondary | ICD-10-CM | POA: Insufficient documentation

## 2023-09-03 DIAGNOSIS — Z86718 Personal history of other venous thrombosis and embolism: Secondary | ICD-10-CM

## 2023-09-03 DIAGNOSIS — I7121 Aneurysm of the ascending aorta, without rupture: Secondary | ICD-10-CM

## 2023-09-03 MED ORDER — VALSARTAN 160 MG PO TABS: 160.0000 mg | ORAL_TABLET | Freq: Every evening | ORAL | 3 refills | Status: DC

## 2023-09-03 MED ORDER — METOPROLOL SUCCINATE ER 25 MG PO TB24: 25.0000 mg | ORAL_TABLET | Freq: Every evening | ORAL | 3 refills | Status: DC

## 2023-09-03 NOTE — Patient Instructions (Addendum)
Medication Instructions:  Your physician has recommended you make the following change in your medication:   REDUCE Valsartan to 160 taking 1 EVERY EVENING  START Toprol Xl 25 taking 1 EVERY EVENING  *If you need a refill on your cardiac medications before your next appointment, please call your pharmacy*   Lab Work: TODAY:  BMET  If you have labs (blood work) drawn today and your tests are completely normal, you will receive your results only by: MyChart Message (if you have MyChart) OR A paper copy in the mail If you have any lab test that is abnormal or we need to change your treatment, we will call you to review the results.   Testing/Procedures: None ordered   Follow-Up: At [facility], you and your health needs are our priority.  As part of our continuing mission to provide you with exceptional heart care, we have created designated Provider Care Teams.  These Care Teams include your primary Cardiologist (physician) and Advanced Practice Providers (APPs -  Physician Assistants and Nurse Practitioners) who all work together to provide you with the care you need, when you need it.  We recommend signing up for the patient portal called "MyChart".  Sign up information is provided on this After Visit Summary.  MyChart is used to connect with patients for Virtual Visits (Telemedicine).  Patients are able to view lab/test results, encounter notes, upcoming appointments, etc.  Non-urgent messages can be sent to your provider as well.   To learn more about what you can do with MyChart, go to [email or web address].    Your next appointment:   3 month(s)  Provider:   Tereso Newcomer, PA-C         Other Instructions You need to get a LARGE cuff for your blood pressure machine.  LEt us kow if blood pressure is under 100 x's 3 in a row or over 140 X's 3 in a row  REMEMBER TO GET SOME COMPRESSION STOCKINGS

## 2023-09-04 LAB — BASIC METABOLIC PANEL
BUN/Creatinine Ratio: 13 (ref 9–20)
BUN: 14 mg/dL (ref 6–24)
CO2: 23 mmol/L (ref 20–29)
Calcium: 9.4 mg/dL (ref 8.7–10.2)
Chloride: 106 mmol/L (ref 96–106)
Creatinine, Ser: 1.06 mg/dL (ref 0.76–1.27)
Glucose: 107 mg/dL — ABNORMAL HIGH (ref 70–99)
Potassium: 3.6 mmol/L (ref 3.5–5.2)
Sodium: 142 mmol/L (ref 134–144)
eGFR: 83 mL/min/{1.73_m2} (ref >59)

## 2023-10-20 ENCOUNTER — Other Ambulatory Visit: Payer: Self-pay | Admitting: Interventional Cardiology

## 2023-10-20 DIAGNOSIS — Z86711 Personal history of pulmonary embolism: Secondary | ICD-10-CM

## 2023-10-21 NOTE — Telephone Encounter (Signed)
Prescription refill request for Eliquis received. Indication: DVT Last office visit: 09/03/23  Wende Mott PA-C Scr: 1.06 on 09/03/23  Epic Age: 57 Weight: 128.5kg  Based on above findings Eliquis 5mg  twice daily is the appropriate dose.  Refill approved.

## 2023-12-02 NOTE — Progress Notes (Signed)
Cardiology Office Note:    Date:  12/03/2023  ID:  [name], DOB January 05, 1966, MRN [id number] PCP: Daisy Floro, MD  [facility] Providers Cardiologist:  Christell Constant, MD Cardiology APP:  Beatrice Lecher, PA-C       Patient Profile:      Bicuspid aortic valve Thoracic aortic aneurysm  Cardiac MRI 12/03/15: EF 59, mild focal basal LVH, bicuspid AV, TAA 4.5 cm Chest CTA 08/29/21: 4.5 cm  Chest/Abd/Pelvis CTA 05/26/23: 4.6 cm Asc TAA, no dissection  Coronary artery Ca2+ Chest pain  LHC 12/08/16: EF 65, no CAD  Myoview 09/04/21: no ischemia, no infarction, EF 67, low risk  Syncope TTE 05/27/23: 60-65, no RWMA, mild LVH, NL RVSF, AV sclerosis, ascending aorta 44 mm, RAP 3  Monitor 06/2023: NSR, PACs, PVCs, no AFib or arrhythmias  Hypertension  Hyperlipidemia  Hx of DVT in 2011, 04/2020 Eliquis Rx Aortic atherosclerosis  Obesity           History of Present Illness:  Discussed the use of AI scribe software for clinical note transcription with the patient, who gave verbal consent to proceed.  [name] is a 57 y.o. male who returns for follow up of orthostatic hypotension, CAD, thoracic aneurysm, bicuspid AV. He was last seen 09/03/23. He had been seen recently for syncope. His ortho VS were positive. I had reduced his spironolactone. At his visit in 08/2023, he was still having dizziness with standing w assoc elevated HR. I reduced his Valsartan and added Metoprolol succinate back to his regimen.  He is here alone. He continues to note episodes of lightheadedness and near syncope.  He has not had further episodes of syncope. These episodes, occurring approximately twice a week, are characterized by sudden onset of lightheadedness without loss of consciousness. The patient reports that these episodes have been ongoing for a couple of years and have either remained the same or worsened recently. These typically happen with prolonged standing. The patient  also reports experiencing lower left chest tightness during bowel movements, which is relieved by passing gas. However, there is no chest discomfort during physical exertion. The patient also notes difficulty lying flat in bed due to shortness of breath, necessitating sleeping in a chair for the past three to four months. The patient denies any leg swelling but reports feeling his heart racing, particularly when trying to sleep.     Review of Systems  Gastrointestinal:  Negative for hematochezia and melena.  Genitourinary:  Negative for hematuria.  See HPI    Studies Reviewed:       Results  - Chart Review  LABS Potassium: 3.6 mmol/L (09/03/2023) Creatinine: 1.06 mg/dL (57/32/2025) ALT: 21 U/L (05/26/2023) Total Cholesterol: 178 mg/dL (42/70/6237) HDL: 43 mg/dL (62/83/1517) LDL: 616 mg/dL (07/37/1062) Triglycerides: 162 mg/dL (69/48/5462) Hemoglobin: 14.4 g/dL (70/35/0093) TSH: 8.182 IU/mL (07/10/2023)      Risk Assessment/Calculations:             Physical Exam:   VS:  BP 110/78   Pulse 99   Ht 6\' 3"  (1.905 m)   Wt 292 lb 12.8 oz (132.8 kg)   SpO2 95%   BMI 36.60 kg/m    Wt Readings from Last 3 Encounters:  12/03/23 292 lb 12.8 oz (132.8 kg)  09/03/23 283 lb 6.4 oz (128.5 kg)  06/22/23 282 lb 3.2 oz (128 kg)    Constitutional:      Appearance: Healthy appearance. Not in distress.  Neck:  Vascular: No JVR. JVD normal.  Pulmonary:     Breath sounds: Normal breath sounds. No wheezing. No rales.  Cardiovascular:     Normal rate. Regular rhythm.     Murmurs: There is no murmur.  Edema:    Peripheral edema present.    Ankle: bilateral trace edema of the ankle. Abdominal:     Palpations: Abdomen is soft.        Assessment and Plan:   Assessment & Plan Orthostatic hypotension Orthostatic hypotension with symptoms of dizziness and lightheadedness, occurring approximately twice a week. Symptoms are exacerbated by prolonged standing. Condition complicated by the  need to balance blood pressure control due to his thoracic aortic aneurysm. No further syncope. His last syncopal episode was in 05/2023. Workup at that time was neg.  - Recommend wearing compression hose - Encourage adequate hydration - Advise pumping calf muscles when standing for long periods - He can resume driving Bicuspid aortic valve Bicuspid aortic valve with no significant aortic stenosis or regurgitation on echocardiogram in June 2024.  Aneurysm of ascending aorta without rupture [facility]) Thoracic aortic aneurysm measuring 4.6 cm by CT in June 2024.   - Arrange follow-up gated aorta/chest CT in June 2025 - Continue current antihypertensive regimen - He notes anxiety with the test in the past. I told him to reach out when it is getting closer so that we can send in diazepam to take the day of.  Essential hypertension Hypertension with overall good control.  - Continue amlodipine 10 mg daily - Continue spironolactone 12.5 mg daily - Continue valsartan 160 mg daily - Continue Metoprolol succinate 25 mg once daily  - Monitor blood pressure at home Hypokalemia Significant hypokalemia in the past. He has been on Spironolactone for years. I have reduced his dose in the past due to orthostasis. Hopefully he can remain on current dose as recent K+ levels have been normal.   History of DVT (deep vein thrombosis) Recurrent DVT, currently on long-term anticoagulation therapy with Eliquis.       Dispo:  Return in about 6 months (around 06/02/2024) for Routine Follow Up, w/ Dr. Izora Ribas.  Signed, Tereso Newcomer, PA-C

## 2023-12-03 ENCOUNTER — Other Ambulatory Visit: Payer: Self-pay | Admitting: *Deleted

## 2023-12-03 ENCOUNTER — Ambulatory Visit: Payer: Medicaid Other | Attending: Physician Assistant | Admitting: Physician Assistant

## 2023-12-03 ENCOUNTER — Encounter: Payer: Self-pay | Admitting: Physician Assistant

## 2023-12-03 VITALS — BP 110/78 | HR 99 | Ht 75.0 in | Wt 292.8 lb

## 2023-12-03 DIAGNOSIS — I7121 Aneurysm of the ascending aorta, without rupture: Secondary | ICD-10-CM | POA: Diagnosis not present

## 2023-12-03 DIAGNOSIS — Q2381 Bicuspid aortic valve: Secondary | ICD-10-CM | POA: Diagnosis not present

## 2023-12-03 DIAGNOSIS — I10 Essential (primary) hypertension: Secondary | ICD-10-CM | POA: Diagnosis not present

## 2023-12-03 DIAGNOSIS — I951 Orthostatic hypotension: Secondary | ICD-10-CM | POA: Diagnosis not present

## 2023-12-03 DIAGNOSIS — E876 Hypokalemia: Secondary | ICD-10-CM

## 2023-12-03 DIAGNOSIS — Z86718 Personal history of other venous thrombosis and embolism: Secondary | ICD-10-CM

## 2023-12-03 MED ORDER — SPIRONOLACTONE 25 MG PO TABS: 12.5000 mg | ORAL_TABLET | Freq: Every day | ORAL | 3 refills | Status: AC

## 2023-12-03 NOTE — Assessment & Plan Note (Signed)
Significant hypokalemia in the past. He has been on Spironolactone for years. I have reduced his dose in the past due to orthostasis. Hopefully he can remain on current dose as recent K+ levels have been normal.

## 2023-12-03 NOTE — Assessment & Plan Note (Signed)
Thoracic aortic aneurysm measuring 4.6 cm by CT in June 2024.   - Arrange follow-up gated aorta/chest CT in June 2025 - Continue current antihypertensive regimen - He notes anxiety with the test in the past. I told him to reach out when it is getting closer so that we can send in diazepam to take the day of.

## 2023-12-03 NOTE — Assessment & Plan Note (Signed)
Hypertension with overall good control.  - Continue amlodipine 10 mg daily - Continue spironolactone 12.5 mg daily - Continue valsartan 160 mg daily - Continue Metoprolol succinate 25 mg once daily  - Monitor blood pressure at home

## 2023-12-03 NOTE — Assessment & Plan Note (Signed)
Orthostatic hypotension with symptoms of dizziness and lightheadedness, occurring approximately twice a week. Symptoms are exacerbated by prolonged standing. Condition complicated by the need to balance blood pressure control due to his thoracic aortic aneurysm. No further syncope. His last syncopal episode was in 05/2023. Workup at that time was neg.  - Recommend wearing compression hose - Encourage adequate hydration - Advise pumping calf muscles when standing for long periods - He can resume driving

## 2023-12-03 NOTE — Assessment & Plan Note (Signed)
Bicuspid aortic valve with no significant aortic stenosis or regurgitation on echocardiogram in June 2024.

## 2023-12-03 NOTE — Patient Instructions (Addendum)
Medication Instructions:  Your physician recommends that you continue on your current medications as directed. Please refer to the Current Medication list given to you today.  *If you need a refill on your cardiac medications before your next appointment, please call your pharmacy*   Lab Work: 6 MONTHS BEFORE THE CT:  GO TO A [facility] AND GET A BMET  If you have labs (blood work) drawn today and your tests are completely normal, you will receive your results only by: MyChart Message (if you have MyChart) OR A paper copy in the mail If you have any lab test that is abnormal or we need to change your treatment, we will call you to review the results.   Testing/Procedures: Non-Cardiac CT Angiography (CTA), IN JUNE 2025 is a special type of CT scan that uses a computer to produce multi-dimensional views of major blood vessels throughout the body. In CT angiography, a contrast material is injected through an IV to help visualize the blood vessels    Follow-Up: At [facility], you and your health needs are our priority.  As part of our continuing mission to provide you with exceptional heart care, we have created designated Provider Care Teams.  These Care Teams include your primary Cardiologist (physician) and Advanced Practice Providers (APPs -  Physician Assistants and Nurse Practitioners) who all work together to provide you with the care you need, when you need it.  We recommend signing up for the patient portal called "MyChart".  Sign up information is provided on this After Visit Summary.  MyChart is used to connect with patients for Virtual Visits (Telemedicine).  Patients are able to view lab/test results, encounter notes, upcoming appointments, etc.  Non-urgent messages can be sent to your provider as well.   To learn more about what you can do with MyChart, go to [email or web address].    Your next appointment:   6 month(s)  Provider:   Christell Constant, MD      Other Instructions  Wear compression stockings Pump your calf muscles when standing Push fluids

## 2024-01-06 ENCOUNTER — Ambulatory Visit: Payer: Medicaid Other | Admitting: Dermatology

## 2024-01-06 ENCOUNTER — Encounter: Payer: Self-pay | Admitting: Dermatology

## 2024-01-06 VITALS — BP 103/65

## 2024-01-06 DIAGNOSIS — Q828 Other specified congenital malformations of skin: Secondary | ICD-10-CM | POA: Diagnosis not present

## 2024-01-06 NOTE — Progress Notes (Signed)
   Follow-Up Visit   Subjective  [name] is a 58 y.o. male who presents for the following: [name] disease follow up - he states condition is about the same. He alternates clobetasol/silvadene with tacrolimus/silvadene twice daily every 2 weeks. Itching is about a 7 out of 10.    The following portions of the chart were reviewed this encounter and updated as appropriate: medications, allergies, medical history  Review of Systems:  No other skin or systemic complaints except as noted in HPI or Assessment and Plan.  Objective  Well appearing patient in no apparent distress; mood and affect are within normal limits.  A focused examination was performed of the following areas:   Relevant exam findings are noted in the Assessment and Plan.    Assessment & Plan   [name] DISEASE  Assessment: Patient presents with chronic erosions and plaques secondary to [facility] , primarily affecting the back (pruritus rated 7/10) and abdomen. Condition fluctuates with periods of improvement and exacerbation. Some lesions have resolved while new ones have appeared. Current treatment with alternating clobetasol-Silvadene and tacrolimus-Silvadene every 2 weeks provides some relief but not complete resolution.  Plan:   Initiate apremilast [place]) therapy: Start with 10 mg daily, titrate to 30 mg twice daily. Provide 4 sample packs for a one-month trial. Monitor for side effects, especially diarrhea.   Continue current topical regimen: Alternate clobetasol-Silvadene and tacrolimus-Silvadene every 2 weeks.   Schedule follow-up appointment in one month.   Instruct patient to contact via MyChart for prescription refills if needed.     Return in about 1 month (around 02/06/2024).  I, [name], CMA, am acting as scribe for [employer], DO .   Documentation: I have reviewed the above documentation for accuracy and completeness, and I agree with the above.  Marcus Reusing,  DO

## 2024-01-06 NOTE — Patient Instructions (Addendum)
Dear Mr. Harrietta Guardian,  Thank you for visiting [place] today. Your dedication to enhancing your skin health is commendable, and it's encouraging to observe some improvement with the cooler weather. Here is a summary of the key instructions from today's consultation:  Medications: Continue with your current regimen of alternating between clobetasol-Silvadene and tacrolimus-Silvadene every two weeks. Should you require a refill, kindly send [place] a message via MyChart.  New Treatment Trial: We are initiating a trial with Otezla (apremilast).   Dosage: Start with one 10 mg tablet daily, gradually increasing to 30 mg twice daily.   Monitoring: Keep an eye out for any side effects, especially diarrhea during the initial weeks.   Supply: You will initially receive 2 sample packs. Upon starting the second pack, combine a 10 mg and a 20 mg tablet to achieve the 30 mg dosage.  Follow-Up: Please schedule a return visit in one month to evaluate your response to [facility] and explore further treatment options. Additional sample packs will be provided if the treatment proves effective.  We remain optimistic that these adjustments will further ameliorate your condition. Eagerly anticipating witnessing your progress at our next meeting.  Best regards,  Dr. [name] Dermatology  Important Information  Due to recent changes in healthcare laws, you may see results of your pathology and/or laboratory studies on MyChart before the doctors have had a chance to review them. We understand that in some cases there may be results that are confusing or concerning to you. Please understand that not all results are received at the same time and often the doctors may need to interpret multiple results in order to provide you with the best plan of care or course of treatment. Therefore, we ask that you please give [place] 2 business days to thoroughly review all your results before contacting the office for clarification. Should we see a  critical lab result, you will be contacted sooner.   If You Need Anything After Your Visit  If you have any questions or concerns for your doctor, please call our main line at [phone] If no one answers, please leave a voicemail as directed and we will return your call as soon as possible. Messages left after 4 pm will be answered the following business day.   You may also send [place] a message via MyChart. We typically respond to MyChart messages within 1-2 business days.  For prescription refills, please ask your pharmacy to contact our office. Our fax number is [phone].  If you have an urgent issue when the clinic is closed that cannot wait until the next business day, you can page your doctor at the number below.    Please note that while we do our best to be available for urgent issues outside of office hours, we are not available 24/7.   If you have an urgent issue and are unable to reach [place], you may choose to seek medical care at your doctor's office, retail clinic, urgent care center, or emergency room.  If you have a medical emergency, please immediately call 911 or go to the emergency department. In the event of inclement weather, please call our main line at [phone] for an update on the status of any delays or closures.  Dermatology Medication Tips: Please keep the boxes that topical medications come in in order to help keep track of the instructions about where and how to use these. Pharmacies typically print the medication instructions only on the boxes and not directly on the medication  tubes.   If your medication is too expensive, please contact our office at [phone] or send [place] a message through MyChart.   We are unable to tell what your co-pay for medications will be in advance as this is different depending on your insurance coverage. However, we may be able to find a substitute medication at lower cost or fill out paperwork to get insurance to cover a needed  medication.   If a prior authorization is required to get your medication covered by your insurance company, please allow [place] 1-2 business days to complete this process.  Drug prices often vary depending on where the prescription is filled and some pharmacies may offer cheaper prices.  The website www.goodrx.com contains coupons for medications through different pharmacies. The prices here do not account for what the cost may be with help from insurance (it may be cheaper with your insurance), but the website can give you the price if you did not use any insurance.  - You can print the associated coupon and take it with your prescription to the pharmacy.  - You may also stop by our office during regular business hours and pick up a GoodRx coupon card.  - If you need your prescription sent electronically to a different pharmacy, notify our office through [facility] or by phone at [phone]

## 2024-02-07 ENCOUNTER — Other Ambulatory Visit: Payer: Self-pay | Admitting: Physician Assistant

## 2024-02-08 ENCOUNTER — Telehealth: Payer: Self-pay | Admitting: Physician Assistant

## 2024-02-08 MED ORDER — POTASSIUM CHLORIDE CRYS ER 20 MEQ PO TBCR: 40.0000 meq | EXTENDED_RELEASE_TABLET | Freq: Two times a day (BID) | ORAL | 3 refills | Status: AC

## 2024-02-08 NOTE — Telephone Encounter (Signed)
*  STAT* If patient is at the pharmacy, call can be transferred to refill team.   1. Which medications need to be refilled? (please list name of each medication and dose if known)   potassium chloride SA (KLOR-CON M20) 20 MEQ tablet (Expired)    2. Which pharmacy/location (including street and city if local pharmacy) is medication to be sent to? [id number] - [place],  - [address] [place] [facility]   3. Do they need a 30 day or 90 day supply? 90

## 2024-02-08 NOTE — Telephone Encounter (Signed)
 Pt's medication was sent to pt's pharmacy as requested. Confirmation received.

## 2024-02-09 ENCOUNTER — Ambulatory Visit: Payer: Medicaid Other | Admitting: Dermatology

## 2024-02-23 ENCOUNTER — Encounter: Payer: Self-pay | Admitting: Dermatology

## 2024-02-23 ENCOUNTER — Ambulatory Visit (INDEPENDENT_AMBULATORY_CARE_PROVIDER_SITE_OTHER): Payer: Medicaid Other | Admitting: Dermatology

## 2024-02-23 DIAGNOSIS — Q828 Other specified congenital malformations of skin: Secondary | ICD-10-CM | POA: Diagnosis not present

## 2024-02-23 DIAGNOSIS — Z139 Encounter for screening, unspecified: Secondary | ICD-10-CM

## 2024-02-23 DIAGNOSIS — Z5181 Encounter for therapeutic drug level monitoring: Secondary | ICD-10-CM

## 2024-02-23 DIAGNOSIS — Z79899 Other long term (current) drug therapy: Secondary | ICD-10-CM

## 2024-02-23 MED ORDER — ACITRETIN 25 MG PO CAPS: 25.0000 mg | ORAL_CAPSULE | Freq: Every day | ORAL | 3 refills | Status: AC

## 2024-02-23 NOTE — Patient Instructions (Addendum)
 Hello [name],  Thank you for visiting [place] today.   Here's a summary of the crucial instructions from our consultation today:  Medication Adjustment: Cease using Otezla immediately.   New Prescription: Start Acitretin, 25 mg daily. It should be taken with a high-fat meal to improve absorption.  Laboratory Tests: The following tests are required to monitor your health while on Acitretin:   Lipid Profile   Comprehensive Metabolic Panel (CMP)   Complete Blood Count (CBC)   Note: Please fast before these tests. They can be scheduled for tomorrow morning or the following morning.  Skin Care:   Aquaphor samples have been provided for dry lips. Consider using eye drops if you experience dry eyes.   A body balm stick, enriched with shea butter and avocado oil, has been provided for enhanced skin care.  Follow-Up: We will schedule a follow-up appointment in 2-3 months. This will allow [place] to assess the effectiveness of Acitretin and discuss any necessary dosage adjustments.  Please ensure to take your medication alongside a meal rich in fats, such as a teaspoon of peanut butter or using cooking oil, to facilitate better absorption. Should you encounter any issues or have additional questions, do not hesitate to reach out to [place] via MyChart.  I look forward to witnessing your progress.  Warm regards,  Dr. [name], Dermatology     Important Information  Due to recent changes in healthcare laws, you may see results of your pathology and/or laboratory studies on MyChart before the doctors have had a chance to review them. We understand that in some cases there may be results that are confusing or concerning to you. Please understand that not all results are received at the same time and often the doctors may need to interpret multiple results in order to provide you with the best plan of care or course of treatment. Therefore, we ask that you please give [place] 2 business days to thoroughly  review all your results before contacting the office for clarification. Should we see a critical lab result, you will be contacted sooner.   If You Need Anything After Your Visit  If you have any questions or concerns for your doctor, please call our main line at [phone] If no one answers, please leave a voicemail as directed and we will return your call as soon as possible. Messages left after 4 pm will be answered the following business day.   You may also send [place] a message via MyChart. We typically respond to MyChart messages within 1-2 business days.  For prescription refills, please ask your pharmacy to contact our office. Our fax number is [phone].  If you have an urgent issue when the clinic is closed that cannot wait until the next business day, you can page your doctor at the number below.    Please note that while we do our best to be available for urgent issues outside of office hours, we are not available 24/7.   If you have an urgent issue and are unable to reach [place], you may choose to seek medical care at your doctor's office, retail clinic, urgent care center, or emergency room.  If you have a medical emergency, please immediately call 911 or go to the emergency department. In the event of inclement weather, please call our main line at [phone] for an update on the status of any delays or closures.  Dermatology Medication Tips: Please keep the boxes that topical medications come in in order to help  keep track of the instructions about where and how to use these. Pharmacies typically print the medication instructions only on the boxes and not directly on the medication tubes.   If your medication is too expensive, please contact our office at [phone] or send [place] a message through MyChart.   We are unable to tell what your co-pay for medications will be in advance as this is different depending on your insurance coverage. However, we may be able to find a  substitute medication at lower cost or fill out paperwork to get insurance to cover a needed medication.   If a prior authorization is required to get your medication covered by your insurance company, please allow [place] 1-2 business days to complete this process.  Drug prices often vary depending on where the prescription is filled and some pharmacies may offer cheaper prices.  The website www.goodrx.com contains coupons for medications through different pharmacies. The prices here do not account for what the cost may be with help from insurance (it may be cheaper with your insurance), but the website can give you the price if you did not use any insurance.  - You can print the associated coupon and take it with your prescription to the pharmacy.  - You may also stop by our office during regular business hours and pick up a GoodRx coupon card.  - If you need your prescription sent electronically to a different pharmacy, notify our office through [facility] or by phone at [phone]

## 2024-02-23 NOTE — Progress Notes (Signed)
   Follow-Up Visit   Subjective  [name] is a 58 y.o. male who presents for the following: [name] Dx  Patient present today for follow up visit. Patient was last evaluated on 01/06/24. At this visit patient was prescribed Otezla. Pt has continued to alt clobetasol & tacrolimus. Patient reports sxs are unchanged since starting [place] and doesn't feel that it is helping. Patient denies medication changes.  The following portions of the chart were reviewed this encounter and updated as appropriate: medications, allergies, medical history  Review of Systems:  No other skin or systemic complaints except as noted in HPI or Assessment and Plan.  Objective  Well appearing patient in no apparent distress; mood and affect are within normal limits.   A focused examination was performed of the following areas: upper body   Relevant exam findings are noted in the Assessment and Plan.                Assessment & Plan   [name] Dx - Assessment: Patient has been on Otezla for 2 months with minimal improvement. Condition expected to worsen with increased sweating during summer. Current topical management includes alternating clobetasol and Silvadene with tacrolimus and Silvadene every 2 weeks. Given underwhelming response to [place] and patient's readiness for new treatment, considering acitretin. Patient has no prior history of using isotretinoin or acitretin.  - Plan:    Discontinue Otezla    Start acitretin 25 mg daily for 2-3 months     - Take with a higher fatty meal (e.g., dinner) for better absorption    Obtain new lab work within the next week:     - Lipid profile     - Comprehensive Metabolic Panel (CMP)     - Complete Blood Count (CBC)    Continue current topical regimen    Provide samples of Aquaphor and body balm stick (with shea butter and avocado oil)    Recommend use of ChapStick and eye drops as needed for potential side effects    Patient informed  about potential side effects including dry chapped lips and dry eyes    Patient instructed to message via MyChart for any questions or issues  SCREENING DUE   Related Procedures Lipid Panel CBC CMP [name] DISEASE    No follow-ups on file.    Documentation: I have reviewed the above documentation for accuracy and completeness, and I agree with the above.  I, Shirron Marcha Solders, CMA, am acting as scribe for [employer], DO.   [name], DO

## 2024-03-06 ENCOUNTER — Ambulatory Visit (INDEPENDENT_AMBULATORY_CARE_PROVIDER_SITE_OTHER): Payer: Self-pay | Admitting: Neurology

## 2024-03-06 ENCOUNTER — Encounter: Payer: Self-pay | Admitting: Neurology

## 2024-03-06 VITALS — BP 110/71 | HR 83 | Ht 75.0 in | Wt 285.0 lb

## 2024-03-06 DIAGNOSIS — I7121 Aneurysm of the ascending aorta, without rupture: Secondary | ICD-10-CM

## 2024-03-06 DIAGNOSIS — Z86718 Personal history of other venous thrombosis and embolism: Secondary | ICD-10-CM | POA: Diagnosis not present

## 2024-03-06 DIAGNOSIS — I951 Orthostatic hypotension: Secondary | ICD-10-CM | POA: Diagnosis not present

## 2024-03-06 NOTE — Patient Instructions (Signed)
 Continue current medications Consider compression socks Continue follow-up PCP and cardiology Return as needed.

## 2024-03-06 NOTE — Progress Notes (Signed)
 [facility]  PATIENT: [name] DOB: May 30, 1966  REQUESTING CLINICIAN: Daisy Floro, MD HISTORY FROM: Patient and chart review  REASON FOR VISIT: Dizziness    HISTORICAL  CHIEF COMPLAINT:  Chief Complaint  Patient presents with   New Patient (Initial Visit)    Pt in 12, here alone Pt is referred for dizziness and unsteadiness. Pt states last year in june he had an episode of dizziness, where he passed out, collapsed and fractured 4 ribs. Pt states the dizziness has been going on for more than a year on and off.     HISTORY OF PRESENT ILLNESS:  This is a 58 year old gentleman past medical history of thoracic aneurysm, hypertension, hyperlipidemia, who is presenting with complaint of dizziness that has been intermittent for the past few years.  Dizziness is described as his vision will get fuzzy, feel lightheaded, sometimes his hearing will go.  Patient reports back in June of last year, he has a severe dizzy spell causing him to fall and breaking his ribs.  He remembers standing next to his truck and the next and that he remembers clearly is walking into the house and sitting on the couch and in severe pain.  Taken to the hospital, CT scan with no acute finding.  Patient did follow-up with cardiology after that again diagnosed with orthostatic hypotension.  He was recommended compressive stocking socks but is not wearing them.  Today also orthostatic vitals were positive.   OTHER MEDICAL CONDITIONS: Hypertension, thoracic aneurysm, history of blood clots    REVIEW OF SYSTEMS: Full 14 system review of systems performed and negative with exception of: As noted in the HPI   ALLERGIES: Allergies  Allergen Reactions   Latex Rash    HOME MEDICATIONS: Outpatient Medications Prior to Visit  Medication Sig Dispense Refill   acetaminophen (TYLENOL) 325 MG tablet Take 2 tablets (650 mg total) by mouth 4 (four) times daily.     acitretin (SORIATANE) 25  MG capsule Take 1 capsule (25 mg total) by mouth daily. 30 capsule 3   amLODipine (NORVASC) 10 MG tablet Take 10 mg by mouth daily.     clindamycin (CLEOCIN-T) 1 % lotion Apply topically daily. Apply topically daily to the affected areas 60 mL 2   clobetasol cream (TEMOVATE) 0.05 % Apply 1 Application topically 2 (two) times daily. Apply 2 x daily for 2 weeks then stop. Restart 2 weeks 120 g 2   ELIQUIS 5 MG TABS tablet TAKE 1 TABLET BY MOUTH TWICE A DAY 60 tablet 5   levothyroxine (SYNTHROID) 112 MCG tablet Take 280 mcg by mouth every Monday, Wednesday, and Friday.     metoprolol succinate (TOPROL XL) 25 MG 24 hr tablet Take 1 tablet (25 mg total) by mouth every evening. 90 tablet 3   nitroGLYCERIN (NITROSTAT) 0.4 MG SL tablet Place 0.4 mg under the tongue every 5 (five) minutes x 3 doses as needed for chest pain.     potassium chloride SA (KLOR-CON M20) 20 MEQ tablet Take 2 tablets (40 mEq total) by mouth 2 (two) times daily. 360 tablet 3   potassium chloride SA (KLOR-CON M20) 20 MEQ tablet Take 20 mEq by mouth 2 (two) times daily.     sertraline (ZOLOFT) 100 MG tablet Take 200 mg by mouth daily.      silver sulfADIAZINE (SILVADENE) 1 % cream Apply 1 Application topically 2 (two) times daily. Apply 2 times daily to affected areas when not using Clobetasol cream 400 g  1   spironolactone (ALDACTONE) 25 MG tablet Take 0.5 tablets (12.5 mg total) by mouth daily. 45 tablet 3   tacrolimus (PROTOPIC) 0.1 % ointment Apply to affected area bid mixed with silvadene alternating every 2 weeks with steroid 100 g 3   valsartan (DIOVAN) 160 MG tablet Take 1 tablet (160 mg total) by mouth every evening. 90 tablet 3   No facility-administered medications prior to visit.    PAST MEDICAL HISTORY: Past Medical History:  Diagnosis Date   ABDOMINAL PAIN-EPIGASTRIC    COAGULOPATHY, COUMADIN-INDUCED    DYSPHAGIA OROPHARYNGEAL PHASE    Heartburn    HYPERLIPIDEMIA    HYPERTENSION    HYPERTENSION NEC     HYPOTHYROIDISM    PE    Primary hypercoagulable state (HCC)    Syncope 05/26/2023   Monitor 06/2023: NSR, PACs, PVCs, no AFib or arrhythmias      Thoracic aneurysm without mention of rupture    Thoracic aortic aneurysm (HCC)    VASCULITIS     PAST SURGICAL HISTORY: Past Surgical History:  Procedure Laterality Date   CARDIAC CATHETERIZATION N/A 12/08/2016   Procedure: Left Heart Cath and Coronary Angiography;  Surgeon: Lennette Bihari, MD;  Location: [facility];  Service: Cardiovascular;  Laterality: N/A;   COLONOSCOPY     2009   COLONOSCOPY W/ POLYPECTOMY     2021, sessile serrated adenoma   ELBOW SURGERY Left 2010   UPPER GI ENDOSCOPY     2011    FAMILY HISTORY: Family History  Problem Relation Age of Onset   Heart disease Mother    Hypertension Father     SOCIAL HISTORY: Social History   Socioeconomic History   Marital status: Single    Spouse name: Not on file   Number of children: Not on file   Years of education: Not on file   Highest education level: Not on file  Occupational History   Not on file  Tobacco Use   Smoking status: Never   Smokeless tobacco: Never  Vaping Use   Vaping status: Never Used  Substance and Sexual Activity   Alcohol use: No   Drug use: No   Sexual activity: Not on file  Other Topics Concern   Not on file  Social History Narrative   Not on file   Social Drivers of Health   Financial Resource Strain: Not on file  Food Insecurity: No Food Insecurity (05/26/2023)   Hunger Vital Sign    Worried About Running Out of Food in the Last Year: Never true    Ran Out of Food in the Last Year: Never true  Transportation Needs: No Transportation Needs (05/26/2023)   PRAPARE - Administrator, Civil Service (Medical): No    Lack of Transportation (Non-Medical): No  Physical Activity: Not on file  Stress: Not on file  Social Connections: Not on file  Intimate Partner Violence: Not At Risk (05/26/2023)   Humiliation, Afraid,  Rape, and Kick questionnaire    Fear of Current or Ex-Partner: No    Emotionally Abused: No    Physically Abused: No    Sexually Abused: No    PHYSICAL EXAM  GENERAL EXAM/CONSTITUTIONAL: Vitals:  Vitals:   03/06/24 1503 03/06/24 1514 03/06/24 1515  BP: 135/78 131/81 110/71  Pulse: 69 73 83  Weight: 285 lb (129.3 kg)    Height: 6\' 3"  (1.905 m)     Body mass index is 35.62 kg/m. Wt Readings from Last 3 Encounters:  03/06/24  285 lb (129.3 kg)  12/03/23 292 lb 12.8 oz (132.8 kg)  09/03/23 283 lb 6.4 oz (128.5 kg)   Patient is in no distress; well developed, nourished and groomed; neck is supple  MUSCULOSKELETAL: Gait, strength, tone, movements noted in Neurologic exam below  NEUROLOGIC: MENTAL STATUS:      No data to display         awake, alert, oriented to person, place and time recent and remote memory intact normal attention and concentration language fluent, comprehension intact, naming intact fund of knowledge appropriate  CRANIAL NERVE:  2nd, 3rd, 4th, 6th - Visual fields full to confrontation, extraocular muscles intact, no nystagmus 5th - facial sensation symmetric 7th - facial strength symmetric 8th - hearing intact 9th - palate elevates symmetrically, uvula midline 11th - shoulder shrug symmetric 12th - tongue protrusion midline  MOTOR:  normal bulk and tone, full strength in the BUE, BLE  SENSORY:  normal and symmetric to light touch  COORDINATION:  finger-nose-finger, fine finger movements normal  REFLEXES:  deep tendon reflexes present and symmetric  GAIT/STATION:  normal   DIAGNOSTIC DATA (LABS, IMAGING, TESTING) - I reviewed patient records, labs, notes, testing and imaging myself where available.  Lab Results  Component Value Date   WBC 6.5 05/28/2023   HGB 14.4 05/28/2023   HCT 42.9 05/28/2023   MCV 99.1 05/28/2023   PLT 150 05/28/2023      Component Value Date/Time   NA 142 09/03/2023 1208   K 3.6 09/03/2023 1208   CL  106 09/03/2023 1208   CO2 23 09/03/2023 1208   GLUCOSE 107 (H) 09/03/2023 1208   GLUCOSE 95 05/28/2023 0746   BUN 14 09/03/2023 1208   CREATININE 1.06 09/03/2023 1208   CALCIUM 9.4 09/03/2023 1208   PROT 6.5 05/26/2023 1703   PROT 7.3 01/24/2020 1131   ALBUMIN 3.4 (L) 05/26/2023 1703   ALBUMIN 4.4 01/24/2020 1131   AST 28 05/26/2023 1703   ALT 21 05/26/2023 1703   ALKPHOS 54 05/26/2023 1703   BILITOT 1.0 05/26/2023 1703   BILITOT 0.5 01/24/2020 1131   GFRNONAA >60 05/28/2023 0746   GFRAA 103 01/24/2020 1131   Lab Results  Component Value Date   CHOL 156 01/24/2020   HDL 44 01/24/2020   LDLCALC 83 01/24/2020   TRIG 166 (H) 01/24/2020   CHOLHDL 3.5 01/24/2020   Lab Results  Component Value Date   HGBA1C  01/17/2010    5.3 (NOTE) The ADA recommends the following therapeutic goal for glycemic control related to Hgb A1c measurement: Goal of therapy: <6.5 Hgb A1c  Reference: American Diabetes Association: Clinical Practice Recommendations 2010, Diabetes Care, 2010, 33: (Suppl  1).   Lab Results  Component Value Date   VITAMINB12 501 04/11/2010   Lab Results  Component Value Date   TSH 0.739 05/28/2023    Head CT 05/26/2023 No acute or traumatic finding. Chronic small-vessel ischemic changes of the cerebral hemispheric white matter.   ASSESSMENT AND PLAN  58 y.o. year old male with history of hypertension, thoracic aneurysm, history of blood clots who is presenting with complaint of dizziness for the past few years resulting in a fall in June, and multiple broken ribs.  On exam patient orthostatic vitals were positive.  He was previously diagnosed with orthostatic hypotension and today he is still positive for orthostatic hypotension. Due to his history of blood clot on Eliquis, aortic aneurysm, he requires a strict blood pressure control putting him at risk of more orthostatic hypotension.  I did advise him to continue with a strict blood pressure control, get the compressive  socks and to continue following up with [facility] and Cardiology.  He voiced understanding.  Return as needed.   1. Orthostatic hypotension   2. Aneurysm of ascending aorta without rupture (HCC)   3. History of blood clots      Patient Instructions  Continue current medications Consider compression socks Continue follow-up PCP and cardiology Return as needed.  No orders of the defined types were placed in this encounter.   No orders of the defined types were placed in this encounter.   Return if symptoms worsen or fail to improve.    Windell Norfolk, MD 03/06/2024, 3:48 PM  [facility] [address], [address] [place], [place] [address] [phone]

## 2024-04-22 ENCOUNTER — Other Ambulatory Visit: Payer: Self-pay | Admitting: Internal Medicine

## 2024-04-22 DIAGNOSIS — Z86711 Personal history of pulmonary embolism: Secondary | ICD-10-CM

## 2024-04-24 NOTE — Telephone Encounter (Signed)
 Prescription refill request for Eliquis  received. Indication: DVT Last office visit: 12/03/23  Donalynn Fry PA-C Scr: 1.06 on 09/03/23  Epic Age: 58 Weight: 132.8kg  Based on above findings Eliquis  5mg  twice daily is the appropriate dose.  Refill approved.

## 2024-05-22 ENCOUNTER — Other Ambulatory Visit: Payer: Self-pay | Admitting: Interventional Cardiology

## 2024-05-29 ENCOUNTER — Ambulatory Visit: Payer: Medicaid Other | Attending: Internal Medicine | Admitting: Internal Medicine

## 2024-05-29 ENCOUNTER — Encounter: Payer: Self-pay | Admitting: Internal Medicine

## 2024-05-29 ENCOUNTER — Other Ambulatory Visit (HOSPITAL_COMMUNITY): Payer: Self-pay

## 2024-05-29 VITALS — BP 116/80 | HR 87 | Ht 75.0 in | Wt 280.0 lb

## 2024-05-29 DIAGNOSIS — I2510 Atherosclerotic heart disease of native coronary artery without angina pectoris: Secondary | ICD-10-CM | POA: Diagnosis present

## 2024-05-29 DIAGNOSIS — I7121 Aneurysm of the ascending aorta, without rupture: Secondary | ICD-10-CM | POA: Insufficient documentation

## 2024-05-29 DIAGNOSIS — Q2381 Bicuspid aortic valve: Secondary | ICD-10-CM | POA: Diagnosis not present

## 2024-05-29 MED ORDER — AMLODIPINE BESYLATE 5 MG PO TABS: 5.0000 mg | ORAL_TABLET | Freq: Every day | ORAL | 3 refills | Status: AC

## 2024-05-29 MED ORDER — LORAZEPAM 1 MG PO TABS: 1.0000 mg | ORAL_TABLET | Freq: Once | ORAL | 0 refills | Status: AC

## 2024-05-29 MED ORDER — LORAZEPAM 1 MG PO TABS
1.0000 mg | ORAL_TABLET | Freq: Once | ORAL | 0 refills | Status: AC
  Filled 2024-05-29: qty 1, 1d supply, fill #0

## 2024-05-29 NOTE — Patient Instructions (Addendum)
 Medication Instructions:  Your physician has recommended you make the following change in your medication:  DECREASE: amlodipine  (Norvasc ) to 5 mg by mouth once daily  Lorazepam  (Ativan ) 1 mg once 30 minutes prior to CT Scan.   *If you need a refill on your cardiac medications before your next appointment, please call your pharmacy*  Lab Work: NONE  If you have labs (blood work) drawn today and your tests are completely normal, you will receive your results only by: MyChart Message (if you have MyChart) OR A paper copy in the mail If you have any lab test that is abnormal or we need to change your treatment, we will call you to review the results.  Testing/Procedures: NONE  Follow-Up: At [facility], you and your health needs are our priority.  As part of our continuing mission to provide you with exceptional heart care, our providers are all part of one team.  This team includes your primary Cardiologist (physician) and Advanced Practice Providers or APPs (Physician Assistants and Nurse Practitioners) who all work together to provide you with the care you need, when you need it.  Your next appointment:   12 month(s)  Provider:   Marlyse Single, PA-C        Other Instructions Please monitor your BP as discussed with Dr. Paulita Boss.

## 2024-05-29 NOTE — Progress Notes (Signed)
 Cardiology Office Note:  .    Date:  05/29/2024  ID:  [name], DOB [birth year], MRN [id number] PCP: Jimmey Mould, MD  [facility] Providers Cardiologist:  Jann Melody, MD Cardiology APP:  Sherwood Donath     CC: Establish Care  History of Present Illness: .    [name] is a 58 y.o. male with a bicuspid aortic valve and thoracic aortic aneurysm who presents for routine follow-up.  He has a history of a bicuspid aortic valve and a thoracic aortic aneurysm, with the aneurysm last measured at 4.6 cm. He is scheduled for repeat imaging in six months, and a CT scan was recently performed. No significant aortic regurgitation or stenosis was noted in a recent echocardiogram.  He has hypertension and hyperlipidemia, managed with multiple medications. He is currently on Eliquis  for a history of deep vein thrombosis in 2011 and 2021. He also has aortic atherosclerosis and an elevated calcium score, though no obstructive disease has been noted.  He experiences dizzy spells, particularly when standing, and collapsed last May due to dizziness. A recent episode occurred a couple of weeks ago. A neurologist performed a test showing a 30-point drop in blood pressure upon standing. Despite medication adjustments, the dizziness persists. He is on amlodipine  10 mg, which was previously reduced.  His family history includes his father having a heart pump, possibly a left ventricular assist device, but no known history of bicuspid aortic valve or similar conditions in his siblings. He has one brother and three sisters, but is unsure if they have been checked for similar conditions.  Discussed the use of AI scribe software for clinical note transcription with the patient, who gave verbal consent to proceed.   Relevant histories: .  Social  - father had LVAD - former [facility] patient ROS: As per HPI.   Studies Reviewed: .     Cardiac Studies & Procedures    ______________________________________________________________________________________________ CARDIAC CATHETERIZATION  CARDIAC CATHETERIZATION 12/08/2016  Conclusion  The left ventricular systolic function is normal.  LV end diastolic pressure is normal.  Normal left ventricular function without focal segmental wall motion abnormality.  Ejection fraction is approximately 65%.  Normal coronary arteries in a right dominant coronary circulation.  RECOMMENDATION: I suspect that the patient's nuclear stress test is a false positive due to diaphragmatic attenuation.  Medical therapy for blood pressure control with follow-up surveillance of the patient's previously documented thoracic aortic aneurysm.  Findings Coronary Findings Diagnostic  Dominance: Right  Left Main Vessel was injected. Vessel is normal in caliber. Vessel is angiographically normal.  Left Anterior Descending Vessel was injected. Vessel is normal in caliber. Vessel is angiographically normal.  Ramus Intermedius Vessel was injected. Vessel is normal in caliber. Vessel is angiographically normal.  Left Circumflex Vessel was injected. Vessel is normal in caliber. Vessel is angiographically normal.  Right Coronary Artery Vessel was injected. Vessel is normal in caliber. Vessel is angiographically normal.  Intervention  No interventions have been documented.   STRESS TESTS  MYOCARDIAL PERFUSION IMAGING 09/04/2021  Narrative   The study is normal. The study is low risk.   No ST deviation was noted.   LV perfusion is normal. There is no evidence of ischemia. There is no evidence of infarction.   Left ventricular function is normal. Nuclear stress EF: 67 %. The left ventricular ejection fraction is hyperdynamic (>65%). End diastolic cavity size is normal. End systolic cavity size is normal.   Prior study not  available for comparison.   ECHOCARDIOGRAM  ECHOCARDIOGRAM COMPLETE  05/27/2023  Narrative ECHOCARDIOGRAM REPORT    Patient Name:   [name] Date of Exam: 05/27/2023 Medical Rec #:  [id number]         Height:       75.0 in Accession #:    [id number]        Weight:       286.8 lb Date of Birth:  02/20/1966         BSA:          2.558 m Patient Age:    56 years          BP:           110/60 mmHg Patient Gender: M                 HR:           62 bpm. Exam Location:  Inpatient  Procedure: 2D Echo, Color Doppler, Cardiac Doppler and Intracardiac Opacification Agent  Indications:    Syncope  History:        Patient has prior history of Echocardiogram examinations, most recent 02/03/2019. CAD, Aortic Valve Disease and Bicuspid aortic valve, Signs/Symptoms:Syncope; Risk Factors:Hypertension and Dyslipidemia.  Sonographer:    Sulema Endo RDCS, FE, PE Referring Phys: [id number] TIMOTHY S OPYD  IMPRESSIONS   1. Left ventricular ejection fraction, by estimation, is 60 to 65%. The left ventricle has normal function. The left ventricle has no regional wall motion abnormalities. There is mild left ventricular hypertrophy. Left ventricular diastolic parameters were normal. 2. Right ventricular systolic function is normal. The right ventricular size is normal. 3. The mitral valve is abnormal. No evidence of mitral valve regurgitation. No evidence of mitral stenosis. 4. The aortic valve is tricuspid. There is mild calcification of the aortic valve. Aortic valve regurgitation is not visualized. Aortic valve sclerosis is present, with no evidence of aortic valve stenosis. 5. Aortic dilatation noted. There is moderate dilatation of the ascending aorta, measuring 44 mm. 6. The inferior vena cava is normal in size with greater than 50% respiratory variability, suggesting right atrial pressure of 3 mmHg.  FINDINGS Left Ventricle: Left ventricular ejection fraction, by estimation, is 60 to 65%. The left ventricle has normal function. The left ventricle has no  regional wall motion abnormalities. The left ventricular internal cavity size was normal in size. There is mild left ventricular hypertrophy. Left ventricular diastolic parameters were normal.  Right Ventricle: The right ventricular size is normal. No increase in right ventricular wall thickness. Right ventricular systolic function is normal.  Left Atrium: Left atrial size was normal in size.  Right Atrium: Right atrial size was normal in size.  Pericardium: There is no evidence of pericardial effusion.  Mitral Valve: The mitral valve is abnormal. There is mild thickening of the mitral valve leaflet(s). Mild mitral annular calcification. No evidence of mitral valve regurgitation. No evidence of mitral valve stenosis.  Tricuspid Valve: The tricuspid valve is normal in structure. Tricuspid valve regurgitation is mild . No evidence of tricuspid stenosis.  Aortic Valve: The aortic valve is tricuspid. There is mild calcification of the aortic valve. Aortic valve regurgitation is not visualized. Aortic valve sclerosis is present, with no evidence of aortic valve stenosis.  Pulmonic Valve: The pulmonic valve was normal in structure. Pulmonic valve regurgitation is mild. No evidence of pulmonic stenosis.  Aorta: Aortic dilatation noted. There is moderate dilatation of the ascending aorta, measuring 44 mm.  Venous: The  inferior vena cava is normal in size with greater than 50% respiratory variability, suggesting right atrial pressure of 3 mmHg.  IAS/Shunts: No atrial level shunt detected by color flow Doppler.   LEFT VENTRICLE PLAX 2D LVIDd:         4.10 cm   Diastology LVIDs:         2.70 cm   LV e' medial:    7.72 cm/s LV PW:         1.20 cm   LV E/e' medial:  7.1 LV IVS:        1.30 cm   LV e' lateral:   11.40 cm/s LVOT diam:     2.50 cm   LV E/e' lateral: 4.8 LV SV:         94 LV SV Index:   37 LVOT Area:     4.91 cm   RIGHT VENTRICLE RV S prime:     9.03 cm/s TAPSE (M-mode): 1.0  cm  LEFT ATRIUM             Index        RIGHT ATRIUM           Index LA diam:        3.30 cm 1.29 cm/m   RA Area:     14.30 cm LA Vol (A2C):   60.2 ml 23.54 ml/m  RA Volume:   33.40 ml  13.06 ml/m LA Vol (A4C):   29.5 ml 11.53 ml/m LA Biplane Vol: 45.2 ml 17.67 ml/m AORTIC VALVE LVOT Vmax:   92.80 cm/s LVOT Vmean:  59.600 cm/s LVOT VTI:    0.192 m  AORTA Ao Root diam: 3.50 cm Ao Asc diam:  4.40 cm  MITRAL VALVE               TRICUSPID VALVE MV Area (PHT): 2.62 cm    TR Peak grad:   23.8 mmHg MV Decel Time: 289 msec    TR Vmax:        244.00 cm/s MV E velocity: 54.50 cm/s MV A velocity: 81.40 cm/s  SHUNTS MV E/A ratio:  0.67        Systemic VTI:  0.19 m Systemic Diam: 2.50 cm  Janelle Mediate MD Electronically signed by Janelle Mediate MD Signature Date/Time: 05/27/2023/3:46:56 PM    Final    MONITORS  CARDIAC EVENT MONITOR 08/02/2023  Narrative   Normal sinus rhythm with  rare PACs and rare PVCs.   Single, brief run of PACs not associated with symptoms.   No atrial fibrillation.   No arrhythmias that would cause syncope.     CARDIAC MRI  MR CARDIAC MORPHOLOGY W WO CONTRAST 12/03/2015  Narrative CLINICAL DATA:  Bicuspid aortic valve, ascending aortic aneurysm.  EXAM: CARDIAC MRI  TECHNIQUE: The patient was scanned on a 1.5 Tesla GE magnet. A dedicated cardiac coil was used. Functional imaging was done using Fiesta sequences. 2,3, and 4 chamber views were done to assess for RWMA's. Modified Simpson's rule using a short axis stack was used to calculate an ejection fraction on a dedicated work [employer]. The patient received 35 cc of Multihance . MR angiography was done. After 10 minutes inversion recovery sequences were used to assess for infiltration and scar tissue.  CONTRAST:  35 cc Multihance   FINDINGS: Limited images of the lung fields showed no significant abnormalities.  Normal left ventricular size with mild focal basal  septal hypertrophy. There was turbulent flow adjacent to the basal septum in  the LV outflow tract. Normal wall motion. EF 59%. Normal right ventricular size and systolic function. The aortic valve was bicuspid. There was no significant regurgitation or stenosis. No significant mitral regurgitation. Normal right and left atrial sizes.  On delayed enhancement imaging, there was no myocardial late gadolinium enhancement (LGE).  On MR angiography, the ascending aorta was dilated to 4.5 cm. The aortic arch was normal in caliber. The arch vessels originated normally. The pulmonary veins drained normally to the left atrium.  MEASUREMENTS: MEASUREMENTS LVEDV 138 mL  LV SV 81 mL  LV EF 59%  Aortic root 3.9 cm  Ascending aorta at maximal diameter 4.5 cm  Aortic arch 2.6 cm  Descending thoracic aorta 2.0 cm  IMPRESSION: 1. Normal left ventricular size and systolic function, EF 59%, with mild focal basal septal hypertrophy.  2. Bicuspid aortic valve with no significant stenosis or regurgitation.  3.  Ascending aortic aneurysm measuring 4.5 cm.  Dalton Mclean   Electronically Signed By: Peder Bourdon M.D. On: 12/03/2015 23:03   ______________________________________________________________________________________________        Physical Exam:    VS:  BP 116/80 (BP Location: Left Arm)   Pulse 87   Ht 6\' 3"  (1.905 m)   Wt 280 lb (127 kg)   SpO2 95%   BMI 35.00 kg/m    Wt Readings from Last 3 Encounters:  05/29/24 280 lb (127 kg)  03/06/24 285 lb (129.3 kg)  12/03/23 292 lb 12.8 oz (132.8 kg)    Gen: no distress   Neck: No JVD Cardiac: No Rubs or Gallops, no murmur, RRR +2 radial pulses Respiratory: Clear to auscultation bilaterally, normal effort, normal  respiratory rate GI: Soft, nontender, non-distended  MS: No  edema;  moves all extremities Integument: Skin feels warm Neuro:  At time of evaluation, alert and oriented to person/place/time/situation   Psych: Normal affect, patient feels ok   ASSESSMENT AND PLAN: .    Thoracic aortic aneurysm Moderate thoracic aortic aneurysm measuring 46 mm. No evidence of coarctation. Potential for future intervention if necessary. - Order echocardiogram in 6 months - Order CT scan in 1 year - Administer Ativan  30 minutes before CT scan on Wednesday (normally I do not do this but he was promised this by his original MD; in the future we will not give this) - avoid fluoroquinolones  Bicuspid aortic valve Bicuspid aortic valve with no significant aortic regurgitation or stenosis. Congenital condition with potential for future valve surgery. Importance of family screening due to hereditary nature. - Educated on bicuspid aortic valve and potential need for future surgery - Advised family screening for bicuspid aortic valve  Hypertension Hypertension managed with multiple medications. Current regimen includes amlodipine  10 mg, which may contribute to orthostatic hypotension. Plan to reduce amlodipine  dose to mitigate symptoms. - Reduce amlodipine  dose from 10 mg to 5 mg - Monitor ambulatory blood pressures  Orthostatic hypotension Orthostatic hypotension with dizziness upon standing. Neurology leading his care.  - Monitor ambulatory blood pressures  DVT - DVT in 2011 and 2021. Managed on Eliquis  as per PCP  CAC and HLD - LDL goal < 70, continue to work toward this  F/u with Acupuncturist in one year  Gloriann Larger, MD [facility] Cardiologist [facility]  [address] [place], [address] [place], [place] [address] [phone]  2:38 PM

## 2024-05-30 ENCOUNTER — Ambulatory Visit: Payer: Self-pay | Admitting: Physician Assistant

## 2024-05-30 DIAGNOSIS — R911 Solitary pulmonary nodule: Secondary | ICD-10-CM

## 2024-05-30 DIAGNOSIS — I7121 Aneurysm of the ascending aorta, without rupture: Secondary | ICD-10-CM

## 2024-05-30 LAB — BASIC METABOLIC PANEL WITH GFR
BUN/Creatinine Ratio: 14 (ref 9–20)
BUN: 15 mg/dL (ref 6–24)
CO2: 21 mmol/L (ref 20–29)
Calcium: 9.2 mg/dL (ref 8.7–10.2)
Chloride: 107 mmol/L — ABNORMAL HIGH (ref 96–106)
Creatinine, Ser: 1.1 mg/dL (ref 0.76–1.27)
Glucose: 104 mg/dL — ABNORMAL HIGH (ref 70–99)
Potassium: 4.1 mmol/L (ref 3.5–5.2)
Sodium: 145 mmol/L — ABNORMAL HIGH (ref 134–144)
eGFR: 79 mL/min/{1.73_m2} (ref >59)

## 2024-05-31 ENCOUNTER — Ambulatory Visit (HOSPITAL_COMMUNITY)
Admission: RE | Admit: 2024-05-31 | Discharge: 2024-05-31 | Disposition: A | Discharge status: Home / Self Care | Payer: Medicaid Other | Source: Ambulatory Visit | Attending: Physician Assistant | Admitting: Physician Assistant

## 2024-05-31 DIAGNOSIS — I7121 Aneurysm of the ascending aorta, without rupture: Secondary | ICD-10-CM | POA: Diagnosis not present

## 2024-05-31 MED ORDER — IOHEXOL 350 MG/ML SOLN
75.0000 mL | Freq: Once | INTRAVENOUS | Status: AC | PRN
  Administered 2024-05-31: 75 mL via INTRAVENOUS

## 2024-06-02 ENCOUNTER — Telehealth: Payer: Self-pay | Admitting: Internal Medicine

## 2024-06-02 DIAGNOSIS — R9389 Abnormal findings on diagnostic imaging of other specified body structures: Secondary | ICD-10-CM

## 2024-06-02 NOTE — Telephone Encounter (Signed)
 Spoke with pt and advised of Dr Daril Edge review and recommendation per radiology.  Faxed to Dr Avanell Bob, PCP.  Encouraged pt to contact PCP's office as well regarding findings.  Order placed for non-contrast CT/chest for 6 weeks.  Pt verbalizes understanding and thanked Charity fundraiser for the call.

## 2024-06-02 NOTE — Telephone Encounter (Signed)
 Call report. Please advise

## 2024-06-02 NOTE — Telephone Encounter (Signed)
 [facility] called to inform provider of new findings on Chest/Aorta CTA:    Will forward to ordering provider and pt's cardiologist to make aware.

## 2024-06-03 ENCOUNTER — Encounter: Payer: Self-pay | Admitting: Physician Assistant

## 2024-06-03 DIAGNOSIS — R911 Solitary pulmonary nodule: Secondary | ICD-10-CM | POA: Insufficient documentation

## 2024-06-06 ENCOUNTER — Other Ambulatory Visit: Payer: Self-pay | Admitting: Dermatology

## 2024-06-07 NOTE — Telephone Encounter (Signed)
 Yes, refills are ok

## 2024-06-15 NOTE — Telephone Encounter (Signed)
 Has this been sent?

## 2024-07-12 NOTE — Telephone Encounter (Signed)
 I am covering Scott's inbox today. Chart returned to Scott's inbox This message was postponed by Lelon Glendia DASEN, PA-C and returned on Monday July 10, 2024. Do not see any acute additional action needed or that Glendia had seen patient last, will leave in his box for review - has repeat CT scheduled 07/14/24.

## 2024-07-14 ENCOUNTER — Ambulatory Visit (HOSPITAL_COMMUNITY)
Admission: RE | Admit: 2024-07-14 | Discharge: 2024-07-14 | Disposition: A | Discharge status: Home / Self Care | Source: Ambulatory Visit | Attending: Internal Medicine | Admitting: Internal Medicine

## 2024-07-14 DIAGNOSIS — R9389 Abnormal findings on diagnostic imaging of other specified body structures: Secondary | ICD-10-CM | POA: Insufficient documentation

## 2024-07-21 ENCOUNTER — Ambulatory Visit: Payer: Self-pay | Admitting: Internal Medicine

## 2024-07-21 DIAGNOSIS — R911 Solitary pulmonary nodule: Secondary | ICD-10-CM

## 2024-07-21 DIAGNOSIS — R9389 Abnormal findings on diagnostic imaging of other specified body structures: Secondary | ICD-10-CM

## 2024-07-21 DIAGNOSIS — I7121 Aneurysm of the ascending aorta, without rupture: Secondary | ICD-10-CM

## 2024-07-25 ENCOUNTER — Encounter: Payer: Self-pay | Admitting: Dermatology

## 2024-07-25 ENCOUNTER — Ambulatory Visit: Admitting: Dermatology

## 2024-07-25 VITALS — BP 156/101

## 2024-07-25 DIAGNOSIS — I10 Essential (primary) hypertension: Secondary | ICD-10-CM

## 2024-07-25 DIAGNOSIS — Q828 Other specified congenital malformations of skin: Secondary | ICD-10-CM | POA: Diagnosis not present

## 2024-07-25 DIAGNOSIS — Z139 Encounter for screening, unspecified: Secondary | ICD-10-CM

## 2024-07-25 DIAGNOSIS — Z79899 Other long term (current) drug therapy: Secondary | ICD-10-CM

## 2024-07-25 MED ORDER — CYCLOSPORINE 100 MG PO CAPS: 100.0000 mg | ORAL_CAPSULE | Freq: Two times a day (BID) | ORAL | 5 refills | Status: DC

## 2024-07-25 MED ORDER — CLOBETASOL PROPIONATE 0.05 % EX CREA: 1.0000 | TOPICAL_CREAM | Freq: Two times a day (BID) | CUTANEOUS | 2 refills | Status: AC

## 2024-07-25 MED ORDER — SILVER SULFADIAZINE 1 % EX CREA: TOPICAL_CREAM | Freq: Two times a day (BID) | CUTANEOUS | 2 refills | Status: AC

## 2024-07-25 MED ORDER — TACROLIMUS 0.1 % EX OINT: TOPICAL_OINTMENT | CUTANEOUS | 2 refills | Status: AC

## 2024-07-25 NOTE — Progress Notes (Signed)
   Follow-Up Visit   Subjective  [name] is a 58 y.o. male established patient who presents for FOLLOW UP on the diagnoses listed below:  Patient was last evaluated on 02/23/24.    [name]: Prescribed Acetretin and informed to continue alternating clobetasol /silvadene  with tacrolimus /silvadene  BID every 2 weeks. Patient reports sxs are better. Itch Score is now 4 out of 10. However, pt stated he only took the Acetretin for 63mo before D/C due to break out of rash.    The following portions of the chart were reviewed this encounter and updated as appropriate: medications, allergies, medical history  Review of Systems:  No other skin or systemic complaints except as noted in HPI or Assessment and Plan.  Objective  Well appearing patient in no apparent distress; mood and affect are within normal limits.   Relevant exam findings are noted in the Assessment and Plan.             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 Condition worsened with summer heat. Previous treatments included acitretin  (exacerbated condition) and Otezla (initially effective, later lost efficacy). Patient has been using compounded clobetasol  with Silvadene  topical treatment on a 2-weeks-on, 2-weeks-off regimen. Patient has history of bicuspid aortic valve, hypertension, and thoracic aortic aneurysm, limiting treatment options.  - Plan:    Discontinue acitretin  due to worsening of [name] symptoms    Initiate cyclosporine  at 2 mg/kg/day (250 mg total daily dose)     - Prescribe 50 mg tablets: 1 tablet in the morning, 2 tablets at night    Continue topical clobetasol -Silvadene  compound as needed    Monitor blood pressure at home regularly    Obtain CMP and magnesium level in 4 weeks    Follow-up appointment in 6 weeks    Patient  instructed to stop cyclosporine  and contact the office if blood pressure increases significantly  2. Hypertension - Assessment: Patient has a history of hypertension. Blood pressure slightly elevated during visit. Patient reports medications were being adjusted due to dizziness, which may have contributed to elevated reading.  - Plan:    Continue current antihypertensive regimen (specific medications not mentioned)    Patient to monitor blood pressure at home    Reassess blood pressure management at next appointment with primary care physician in December  Discussed Risks of Cyclosporine : Reviewed potential side effects of cyclosporine  therapy with the patient, including nephrotoxicity, hypertension, increased risk of infections, gastrointestinal symptoms, gum hyperplasia, hypertrichosis, and potential long-term malignancy risk. Emphasized the importance of regular blood pressure monitoring and lab work to assess renal function and drug levels. Patient verbalized understanding.   No follow-ups on file.   Documentation: I have reviewed the above documentation for accuracy and completeness, and I agree with the above.  I, Shirron Maranda, CMA, am acting as scribe for [employer], DO.   Delon Lenis, DO

## 2024-07-25 NOTE — Patient Instructions (Addendum)
 Date: Tue Jul 25 2024  Hello [name],  Thank you for visiting today. Here is a summary of the key instructions:  - Medications:   - Stop taking acitretin    - Start cyclosporine :     - Take 100 mg tablets      - Take 1 tablet in the morning     - Take 1 tablet at night    - Continue using compound clobetasol  with Silvadene  as needed  - Home Monitoring:   - Check your blood pressure at home regularly   - If blood pressure keeps going up after starting cyclosporine , stop the medication and contact us   - Lab Tests:   - Get blood tests in 4 weeks:     - Complete Metabolic Panel (CMP)     - Magnesium level  - Follow-up:   - Return for follow-up appointment in 6 weeks  - Important:   - If blood pressure increases after starting cyclosporine , stop the medication and contact us  through MyChart or by phone  Please reach out if you have any questions or concerns.  Warm regards,  Dr. Delon Lenis Dermatology           Important Information  Due to recent changes in healthcare laws, you may see results of your pathology and/or laboratory studies on MyChart before the doctors have had a chance to review them. We understand that in some cases there may be results that are confusing or concerning to you. Please understand that not all results are received at the same time and often the doctors may need to interpret multiple results in order to provide you with the best plan of care or course of treatment. Therefore, we ask that you please give us  2 business days to thoroughly review all your results before contacting the office for clarification. Should we see a critical lab result, you will be contacted sooner.   If You Need Anything After Your Visit  If you have any questions or concerns for your doctor, please call our main line at [phone] If no one answers, please leave a voicemail as directed and we will return your call as soon as possible. Messages left after 4 pm will  be answered the following business day.   You may also send us  a message via MyChart. We typically respond to MyChart messages within 1-2 business days.  For prescription refills, please ask your pharmacy to contact our office. Our fax number is [phone].  If you have an urgent issue when the clinic is closed that cannot wait until the next business day, you can page your doctor at the number below.    Please note that while we do our best to be available for urgent issues outside of office hours, we are not available 24/7.   If you have an urgent issue and are unable to reach us , you may choose to seek medical care at your doctor's office, retail clinic, urgent care center, or emergency room.  If you have a medical emergency, please immediately call 911 or go to the emergency department. In the event of inclement weather, please call our main line at [phone] for an update on the status of any delays or closures.  Dermatology Medication Tips: Please keep the boxes that topical medications come in in order to help keep track of the instructions about where and how to use these. Pharmacies typically print the medication instructions only on the boxes and not directly on the medication tubes.  If your medication is too expensive, please contact our office at [phone] or send us  a message through MyChart.   We are unable to tell what your co-pay for medications will be in advance as this is different depending on your insurance coverage. However, we may be able to find a substitute medication at lower cost or fill out paperwork to get insurance to cover a needed medication.   If a prior authorization is required to get your medication covered by your insurance company, please allow us  1-2 business days to complete this process.  Drug prices often vary depending on where the prescription is filled and some pharmacies may offer cheaper prices.  The website www.goodrx.com contains  coupons for medications through different pharmacies. The prices here do not account for what the cost may be with help from insurance (it may be cheaper with your insurance), but the website can give you the price if you did not use any insurance.  - You can print the associated coupon and take it with your prescription to the pharmacy.  - You may also stop by our office during regular business hours and pick up a GoodRx coupon card.  - If you need your prescription sent electronically to a different pharmacy, notify our office through [facility] or by phone at [phone]

## 2024-09-04 ENCOUNTER — Other Ambulatory Visit: Payer: Self-pay | Admitting: Physician Assistant

## 2024-09-07 ENCOUNTER — Ambulatory Visit (INDEPENDENT_AMBULATORY_CARE_PROVIDER_SITE_OTHER): Admitting: Dermatology

## 2024-09-07 VITALS — BP 152/90

## 2024-09-07 DIAGNOSIS — Z79899 Other long term (current) drug therapy: Secondary | ICD-10-CM

## 2024-09-07 DIAGNOSIS — Q828 Other specified congenital malformations of skin: Secondary | ICD-10-CM

## 2024-09-07 NOTE — Progress Notes (Signed)
   Follow-Up Visit   Subjective  [name] is a 58 y.o. 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 Patient was last evaluated on 07/25/24. At this visit patient was prescribed  cyclosporine  at 2 mg/kg/day (250 mg total daily dose) and to continue topical clobetasol -Silvadene  compound as needed.  Patient reports sxs are better. Patient denies medication changes. Hypertension was also addressed.  The following portions of the chart were reviewed this encounter and updated as appropriate: medications, allergies, medical history  Review of Systems:  No other skin or systemic complaints except as noted in HPI or Assessment and Plan.  Objective  Well appearing patient in no apparent distress; mood and affect are within normal limits.  A full examination was performed including scalp, head, eyes, ears, nose, lips, neck, chest, axillae, abdomen, back, buttocks, bilateral upper extremities, bilateral lower extremities, hands, feet, fingers, toes, fingernails, and toenails. All findings within normal limits unless otherwise noted below.   A focused examination was performed of the following areas: Trunk & upper extremities  Relevant exam findings are noted in the Assessment and Plan.             Assessment & Plan   [name] Exam: improved, arms/chest clear on exam today  - Assessment: Patient reports significant improvement in skin condition with cyclosporine  therapy, noting clearance of most erosions and blisters within one week of starting treatment. Current examination reveals clear arms and elbows, with only a small affected area on the back. No active lesions in the underarms, which have been clear for an extended period. The rapid response to cyclosporine  is consistent with its known fast-acting properties in treating autoimmune blistering disorders. - Plan:    Continue cyclosporine   (monitoring BP and magnesium levels, both currently WNL)    Alternate between clobetasol -Silvadene  and tacrolimus  topically    Extend breaks from clobetasol  use beyond 2 weeks    Monitor blood pressure every other day at home    Order kidney function tests and check magnesium levels    Provide lab slip for ordered tests    Review lab results and communicate findings to patient via MyChart   No follow-ups on file.   Documentation: I have reviewed the above documentation for accuracy and completeness, and I agree with the above.  Marcus Lenis, DO

## 2024-09-07 NOTE — Patient Instructions (Addendum)
 Date: Thu Sep 07 2024  Hello S. Kross,  Thank you for visiting today. Here is a summary of the key instructions:  - Medications:   - Continue using cyclosporine  as prescribed   - Take your blood pressure medicine regularly   - For skin treatment, alternate between clobetasol -Silvadene  and tacrolimus    - Try to extend breaks from clobetasol  for longer than 2 weeks  - Home Monitoring:   - Check your blood pressure every other day   - If blood pressure is always high, contact the office  - Lab Tests:   - Complete the provided lab work to check kidney function and magnesium levels  - Follow-up:   - Return for follow-up appointment in 6 months   - If skin condition changes or treatment stops working, call the office   - We will see you sooner if needed  - Other Instructions:   - Check MyChart for lab result messages   - Use MyChart to request medication refills if needed  Please reach out if you have any questions or concerns.  Warm regards,  Dr. Delon Lenis Dermatology Important Information  Due to recent changes in healthcare laws, you may see results of your pathology and/or laboratory studies on MyChart before the doctors have had a chance to review them. We understand that in some cases there may be results that are confusing or concerning to you. Please understand that not all results are received at the same time and often the doctors may need to interpret multiple results in order to provide you with the best plan of care or course of treatment. Therefore, we ask that you please give us  2 business days to thoroughly review all your results before contacting the office for clarification. Should we see a critical lab result, you will be contacted sooner.   If You Need Anything After Your Visit  If you have any questions or concerns for your doctor, please call our main line at [phone] If no one answers, please leave a voicemail as directed and we will return your  call as soon as possible. Messages left after 4 pm will be answered the following business day.   You may also send us  a message via MyChart. We typically respond to MyChart messages within 1-2 business days.  For prescription refills, please ask your pharmacy to contact our office. Our fax number is [phone].  If you have an urgent issue when the clinic is closed that cannot wait until the next business day, you can page your doctor at the number below.    Please note that while we do our best to be available for urgent issues outside of office hours, we are not available 24/7.   If you have an urgent issue and are unable to reach us , you may choose to seek medical care at your doctor's office, retail clinic, urgent care center, or emergency room.  If you have a medical emergency, please immediately call 911 or go to the emergency department. In the event of inclement weather, please call our main line at [phone] for an update on the status of any delays or closures.  Dermatology Medication Tips: Please keep the boxes that topical medications come in in order to help keep track of the instructions about where and how to use these. Pharmacies typically print the medication instructions only on the boxes and not directly on the medication tubes.   If your medication is too expensive, please contact our office at  [phone] or send us  a message through MyChart.   We are unable to tell what your co-pay for medications will be in advance as this is different depending on your insurance coverage. However, we may be able to find a substitute medication at lower cost or fill out paperwork to get insurance to cover a needed medication.   If a prior authorization is required to get your medication covered by your insurance company, please allow us  1-2 business days to complete this process.  Drug prices often vary depending on where the prescription is filled and some pharmacies may offer  cheaper prices.  The website www.goodrx.com contains coupons for medications through different pharmacies. The prices here do not account for what the cost may be with help from insurance (it may be cheaper with your insurance), but the website can give you the price if you did not use any insurance.  - You can print the associated coupon and take it with your prescription to the pharmacy.  - You may also stop by our office during regular business hours and pick up a GoodRx coupon card.  - If you need your prescription sent electronically to a different pharmacy, notify our office through [facility] or by phone at [phone]

## 2024-09-14 ENCOUNTER — Other Ambulatory Visit: Payer: Self-pay | Admitting: Physician Assistant

## 2024-09-17 ENCOUNTER — Encounter: Payer: Self-pay | Admitting: Dermatology

## 2024-09-21 ENCOUNTER — Other Ambulatory Visit: Payer: Self-pay

## 2024-09-21 ENCOUNTER — Ambulatory Visit: Payer: Self-pay | Admitting: Dermatology

## 2024-09-21 DIAGNOSIS — Z79899 Other long term (current) drug therapy: Secondary | ICD-10-CM

## 2024-09-21 LAB — MAGNESIUM: Magnesium: 1.9 mg/dL (ref 1.6–2.3)

## 2024-09-21 LAB — COMPREHENSIVE METABOLIC PANEL WITH GFR
ALT: 15 IU/L (ref 0–44)
AST: 19 IU/L (ref 0–40)
Albumin: 3.9 g/dL (ref 3.8–4.9)
Alkaline Phosphatase: 81 IU/L (ref 47–123)
BUN/Creatinine Ratio: 13 (ref 9–20)
BUN: 17 mg/dL (ref 6–24)
Bilirubin Total: 0.6 mg/dL (ref 0.0–1.2)
CO2: 25 mmol/L (ref 20–29)
Calcium: 9.3 mg/dL (ref 8.7–10.2)
Chloride: 105 mmol/L (ref 96–106)
Creatinine, Ser: 1.29 mg/dL — ABNORMAL HIGH (ref 0.76–1.27)
Globulin, Total: 2.8 g/dL (ref 1.5–4.5)
Glucose: 74 mg/dL (ref 70–99)
Potassium: 4.2 mmol/L (ref 3.5–5.2)
Sodium: 144 mmol/L (ref 134–144)
Total Protein: 6.7 g/dL (ref 6.0–8.5)
eGFR: 65 mL/min/1.73 (ref >59)

## 2024-09-21 LAB — CBC
Hematocrit: 48.2 % (ref 37.5–51.0)
Hemoglobin: 16.1 g/dL (ref 13.0–17.7)
MCH: 32.5 pg (ref 26.6–33.0)
MCHC: 33.4 g/dL (ref 31.5–35.7)
MCV: 97 fL (ref 79–97)
Platelets: 239 x10E3/uL (ref 150–450)
RBC: 4.96 x10E6/uL (ref 4.14–5.80)
RDW: 13.4 % (ref 11.6–15.4)
WBC: 6.1 x10E3/uL (ref 3.4–10.8)

## 2024-09-21 NOTE — Progress Notes (Signed)
 Hi Hollie,  Can you please send an electronic orad for a CMP for next month.  Thanks!

## 2024-10-26 ENCOUNTER — Telehealth: Payer: Self-pay | Admitting: Internal Medicine

## 2024-10-26 DIAGNOSIS — Z86711 Personal history of pulmonary embolism: Secondary | ICD-10-CM

## 2024-10-26 MED ORDER — APIXABAN 5 MG PO TABS
5.0000 mg | ORAL_TABLET | Freq: Two times a day (BID) | ORAL | 1 refills | Status: AC
Start: 2024-10-26 — End: ?

## 2024-10-26 NOTE — Telephone Encounter (Signed)
 Prescription refill request for Eliquis  received. Indication: DVT Last office visit: 05/29/24 Scr: 1.1 Care Everywhere 05/29/24 Age: 58 Weight: 127KG  Per Protocol, Pt is good on current dose.

## 2024-10-26 NOTE — Telephone Encounter (Signed)
*  STAT* If patient is at the pharmacy, call can be transferred to refill team.   1. Which medications need to be refilled? (please list name of each medication and dose if known) ELIQUIS  5 MG TABS tablet    2. Would you like to learn more about the convenience, safety, & potential cost savings by using the [facility]? No     3. Are you open to using the [facility] (Type [facility]. No    4. Which pharmacy/location (including street and city if local pharmacy) is medication to be sent to?[id number] - [place], [place] - [address] [facility]    5. Do they need a 30 day or 90 day supply? 30 day   Pt is out of medication.

## 2024-10-26 NOTE — Telephone Encounter (Signed)
 RX sent in

## 2024-11-22 LAB — COMPREHENSIVE METABOLIC PANEL WITH GFR
ALT: 14 IU/L (ref 0–44)
AST: 19 IU/L (ref 0–40)
Albumin: 4.3 g/dL (ref 3.8–4.9)
Alkaline Phosphatase: 86 IU/L (ref 47–123)
BUN/Creatinine Ratio: 16 (ref 9–20)
BUN: 18 mg/dL (ref 6–24)
Bilirubin Total: 0.7 mg/dL (ref 0.0–1.2)
CO2: 26 mmol/L (ref 20–29)
Calcium: 9.6 mg/dL (ref 8.7–10.2)
Chloride: 103 mmol/L (ref 96–106)
Creatinine, Ser: 1.14 mg/dL (ref 0.76–1.27)
Globulin, Total: 2.9 g/dL (ref 1.5–4.5)
Glucose: 103 mg/dL — ABNORMAL HIGH (ref 70–99)
Potassium: 4.1 mmol/L (ref 3.5–5.2)
Sodium: 142 mmol/L (ref 134–144)
Total Protein: 7.2 g/dL (ref 6.0–8.5)
eGFR: 75 mL/min/1.73 (ref >59)

## 2024-12-04 ENCOUNTER — Ambulatory Visit: Payer: Self-pay | Admitting: Dermatology

## 2025-01-11 ENCOUNTER — Telehealth (HOSPITAL_BASED_OUTPATIENT_CLINIC_OR_DEPARTMENT_OTHER): Payer: Self-pay | Admitting: *Deleted

## 2025-01-11 NOTE — Telephone Encounter (Signed)
"  ° °  Pre-operative Risk Assessment    Patient Name: [name]  DOB: 1966/02/02 MRN: [id number]   Date of last office visit: 05/29/24 DR. [facility] Date of next office visit: NONE   Request for Surgical Clearance    Procedure:  COLONOSCOPY  Date of Surgery:  Clearance 01/17/25                                Surgeon:  DR. BURNETTE Surgeon's Group or Practice Name:  [facility] Phone number:  [phone] Fax number:  [phone]   Type of Clearance Requested:   - Medical  - Pharmacy:  Hold Apixaban  (Eliquis ) x 2 DAYS PRIOR    Type of Anesthesia:  PROPOFOL    Additional requests/questions:    Bonney Niels Jest   01/11/2025, 5:09 PM   "

## 2025-01-12 ENCOUNTER — Telehealth: Payer: Self-pay

## 2025-01-12 ENCOUNTER — Ambulatory Visit: Attending: Student

## 2025-01-12 DIAGNOSIS — Z01818 Encounter for other preprocedural examination: Secondary | ICD-10-CM

## 2025-01-12 DIAGNOSIS — Z01810 Encounter for preprocedural cardiovascular examination: Secondary | ICD-10-CM | POA: Diagnosis not present

## 2025-01-12 NOTE — Telephone Encounter (Signed)
" ° °  Name: [name]  DOB: 1966/08/29  MRN: [id number]  Primary Cardiologist: Stanly DELENA Leavens, MD   Preoperative team, please contact this patient and set up a phone call appointment for further preoperative risk assessment. Please obtain consent and complete medication review. Thank you for your help.  I confirm that guidance regarding antiplatelet and oral anticoagulation therapy has been completed and, if necessary, noted below.  Patient is on eliquis  for history of DVT. This has been managed by his PCP, and recommendations regarding eliquis  hold should come from them.   I also confirmed the patient resides in the state of [place] . As per [facility] Medical Board telemedicine laws, the patient must reside in the state in which the provider is licensed.   Rollo FABIENE Louder, PA-C 01/12/2025, 7:47 AM [facility]    "

## 2025-01-12 NOTE — Telephone Encounter (Signed)
"  °  Patient Consent for Virtual Visit        [name] has provided verbal consent on 01/12/2025 for a virtual visit (video or telephone).   CONSENT FOR VIRTUAL VISIT FOR:  [name]  By participating in this virtual visit I agree to the following:  I hereby voluntarily request, consent and authorize [facility] and its employed or contracted physicians, physician assistants, nurse practitioners or other licensed health care professionals (the Practitioner), to provide me with telemedicine health care services (the Services) as deemed necessary by the treating Practitioner. I acknowledge and consent to receive the Services by the Practitioner via telemedicine. I understand that the telemedicine visit will involve communicating with the Practitioner through live audiovisual communication technology and the disclosure of certain medical information by electronic transmission. I acknowledge that I have been given the opportunity to request an in-person assessment or other available alternative prior to the telemedicine visit and am voluntarily participating in the telemedicine visit.  I understand that I have the right to withhold or withdraw my consent to the use of telemedicine in the course of my care at any time, without affecting my right to future care or treatment, and that the Practitioner or I may terminate the telemedicine visit at any time. I understand that I have the right to inspect all information obtained and/or recorded in the course of the telemedicine visit and may receive copies of available information for a reasonable fee.  I understand that some of the potential risks of receiving the Services via telemedicine include:  Delay or interruption in medical evaluation due to technological equipment failure or disruption; Information transmitted may not be sufficient (e.g. poor resolution of images) to allow for appropriate medical decision making by the  Practitioner; and/or  In rare instances, security protocols could fail, causing a breach of personal health information.  Furthermore, I acknowledge that it is my responsibility to provide information about my medical history, conditions and care that is complete and accurate to the best of my ability. I acknowledge that Practitioner's advice, recommendations, and/or decision may be based on factors not within their control, such as incomplete or inaccurate data provided by me or distortions of diagnostic images or specimens that may result from electronic transmissions. I understand that the practice of medicine is not an exact science and that Practitioner makes no warranties or guarantees regarding treatment outcomes. I acknowledge that a copy of this consent can be made available to me via my patient portal [facility] MyChart), or I can request a printed copy by calling the office of [facility].    I understand that my insurance will be billed for this visit.   I have read or had this consent read to me. I understand the contents of this consent, which adequately explains the benefits and risks of the Services being provided via telemedicine.  I have been provided ample opportunity to ask questions regarding this consent and the Services and have had my questions answered to my satisfaction. I give my informed consent for the services to be provided through the use of telemedicine in my medical care    "

## 2025-01-12 NOTE — Telephone Encounter (Signed)
 Preop tele appt now scheduled, med rec and consent done.

## 2025-01-12 NOTE — Progress Notes (Signed)
 "   Virtual Visit via Telephone Note   Because of [name] co-morbid illnesses, he is at least at moderate risk for complications without adequate follow up.  This format is felt to be most appropriate for this patient at this time.  Due to technical limitations with video connection (technology), today's appointment will be conducted as an audio only telehealth visit, and [name] verbally agreed to proceed in this manner.   All issues noted in this document were discussed and addressed.  No physical exam could be performed with this format.  Evaluation Performed:  Preoperative cardiovascular risk assessment _____________   Date:  01/12/2025   Patient ID:  [name], DOB [birth year], MRN [id number] Patient Location:  Home Provider location:   Office  Primary Care Provider:  Okey Carlin Redbird, MD Primary Cardiologist:  Stanly DELENA Leavens, MD  Chief Complaint / Patient Profile   59 y.o. y/o male with a h/o bicuspid aortic valve, CAD, thoracic aortic aneurysm, orthostatic hypotension, and hypothyroidism who is pending colonoscopy and presents today for telephonic preoperative cardiovascular risk assessment.  History of Present Illness    [name] is a 59 y.o. male who presents via audio/video conferencing for a telehealth visit today.  Pt was last seen in cardiology clinic on 05/29/2024 by Dr.Chandrasekhar.  At that time [name] was doing well.  The patient is now pending procedure as outlined above. Since his last visit, he does have a fractured wrist. He notes his BP at home is doing good.   He denies chest pain, shortness of breath, lower extremity edema, fatigue, palpitations, melena, hematuria, hemoptysis, diaphoresis, weakness, presyncope, syncope, orthopnea, and PND.  Past Medical History    Past Medical History:  Diagnosis Date   ABDOMINAL PAIN-EPIGASTRIC    COAGULOPATHY, COUMADIN-INDUCED    DYSPHAGIA OROPHARYNGEAL PHASE     Heartburn    HYPERLIPIDEMIA    HYPERTENSION    HYPERTENSION NEC    HYPOTHYROIDISM    PE    Primary hypercoagulable state    Pulmonary nodule 06/03/2024   Chest CTA 06/02/2024: Ascending thoracic aortic aneurysm 4.5 cm; new irregular subpleural nodular opacities in periphery of left upper lobe consistent with active infectious/inflammatory process    Syncope 05/26/2023   Monitor 06/2023: NSR, PACs, PVCs, no AFib or arrhythmias      Thoracic aneurysm without mention of rupture    Thoracic aortic aneurysm    VASCULITIS    Past Surgical History:  Procedure Laterality Date   CARDIAC CATHETERIZATION N/A 12/08/2016   Procedure: Left Heart Cath and Coronary Angiography;  Surgeon: Marcus DELENA Sor, MD;  Location: [facility] INVASIVE CV LAB;  Service: Cardiovascular;  Laterality: N/A;   COLONOSCOPY     2009   COLONOSCOPY W/ POLYPECTOMY     2021, sessile serrated adenoma   ELBOW SURGERY Left 2010   UPPER GI ENDOSCOPY     2011    Allergies  Allergies[1]  Home Medications    Prior to Admission medications  Medication Sig Start Date End Date Taking? Authorizing Provider  acetaminophen  (TYLENOL ) 500 MG tablet Take 1,000 mg by mouth every 6 (six) hours as needed for moderate pain (pain score 4-6).    [provider]  amLODipine  (NORVASC ) 5 MG tablet Take 1 tablet (5 mg total) by mouth daily. 05/29/24   Chandrasekhar, Stanly DELENA, MD  apixaban  (ELIQUIS ) 5 MG TABS tablet Take 1 tablet (5 mg total) by mouth 2 (two) times daily. 10/26/24   Stone Stanly  A, MD  clobetasol  cream (TEMOVATE ) 0.05 % Apply 1 Application topically 2 (two) times daily. Apply 2 x daily for 2 weeks then stop. Restart 2 weeks 07/25/24   Alm Delon SAILOR, DO  cycloSPORINE  (SANDIMMUNE ) 100 MG capsule Take 1 capsule (100 mg total) by mouth 2 (two) times daily. 07/25/24   Alm Delon SAILOR, DO  levothyroxine  (SYNTHROID ) 112 MCG tablet Take 280 mcg by mouth every Monday, Wednesday, and Friday. 02/08/23   [provider]   metoprolol  succinate (TOPROL -XL) 25 MG 24 hr tablet TAKE 1 TABLET BY MOUTH EVERY EVENING 09/14/24   Chandrasekhar, Mahesh A, MD  nitroGLYCERIN  (NITROSTAT ) 0.4 MG SL tablet Place 0.4 mg under the tongue every 5 (five) minutes x 3 doses as needed for chest pain. 05/13/21   [provider]  potassium chloride  SA (KLOR-CON  M20) 20 MEQ tablet Take 2 tablets (40 mEq total) by mouth 2 (two) times daily. 02/08/24   Lelon Hamilton T, PA-C  sertraline  (ZOLOFT ) 100 MG tablet Take 200 mg by mouth daily.  10/30/19   [provider]  silver  sulfADIAZINE  (SILVADENE ) 1 % cream Apply topically 2 (two) times daily. 07/25/24   Alm Delon SAILOR, DO  spironolactone  (ALDACTONE ) 25 MG tablet Take 0.5 tablets (12.5 mg total) by mouth daily. 12/03/23   Lelon Hamilton T, PA-C  tacrolimus  (PROTOPIC ) 0.1 % ointment Apply to affected area bid mixed with silvadene  alternating every 2 weeks with steroid 07/25/24   Alm Delon SAILOR, DO  valsartan  (DIOVAN ) 160 MG tablet TAKE 1 TABLET (160 MG TOTAL) BY MOUTH EVERY EVENING. 09/05/24   Lelon Hamilton DASEN, [place]    Physical Exam    Vital Signs:  [name] does not have vital signs available for review today.  Given telephonic nature of communication, physical exam is limited. AAOx3. NAD. Normal affect.  Speech and respirations are unlabored.  Accessory Clinical Findings    None  Assessment & Plan    1.  Preoperative Cardiovascular Risk Assessment:  According to the Revised Cardiac Risk Index (RCRI), his Perioperative Risk of Major Cardiac Event is (%): 0.4 His Functional Capacity in METs is: 4.43 according to the [employer] Activity Status Index (DASI). Per AHA/ACC guidelines, he is deemed acceptable risk for the planned procedure without additional cardiovascular testing. Will route to surgical team so they are aware.   The patient was advised that if he develops new symptoms prior to surgery to contact our office to arrange for a follow-up visit, and he  verbalized understanding.  Patient is on eliquis  for history of DVT. This has been managed by his PCP, and recommendations regarding eliquis  hold should come from them.   A copy of this note will be routed to requesting surgeon.  Time:   Today, I have spent 10 minutes with the patient with telehealth technology discussing medical history, symptoms, and management plan.     Mardy KATHEE Pizza, FNP  01/12/2025, 3:01 PM     [1]  Allergies Allergen Reactions   Latex Rash   "

## 2025-01-17 NOTE — Telephone Encounter (Signed)
 Entry error/duplicate

## 2025-01-23 ENCOUNTER — Other Ambulatory Visit: Payer: Self-pay | Admitting: Dermatology

## 2025-01-23 DIAGNOSIS — Q828 Other specified congenital malformations of skin: Secondary | ICD-10-CM

## 2025-02-02 ENCOUNTER — Ambulatory Visit (HOSPITAL_COMMUNITY)

## 2025-03-07 ENCOUNTER — Ambulatory Visit: Admitting: Dermatology
